# Patient Record
Sex: Female | Born: 1955 | Race: White | Hispanic: No | Marital: Married | Smoking: Former smoker
Health system: Southern US, Community
[De-identification: ages and names within clinical notes are randomized; demographics above are authoritative.]

## PROBLEM LIST (undated history)

## (undated) DIAGNOSIS — I10 Essential (primary) hypertension: Secondary | ICD-10-CM

## (undated) DIAGNOSIS — G4730 Sleep apnea, unspecified: Secondary | ICD-10-CM

## (undated) DIAGNOSIS — R519 Headache, unspecified: Secondary | ICD-10-CM

## (undated) DIAGNOSIS — M51369 Other intervertebral disc degeneration, lumbar region without mention of lumbar back pain or lower extremity pain: Secondary | ICD-10-CM

## (undated) DIAGNOSIS — R920 Mammographic microcalcification found on diagnostic imaging of breast: Secondary | ICD-10-CM

## (undated) DIAGNOSIS — Z9889 Other specified postprocedural states: Secondary | ICD-10-CM

## (undated) DIAGNOSIS — F329 Major depressive disorder, single episode, unspecified: Secondary | ICD-10-CM

## (undated) DIAGNOSIS — M1611 Unilateral primary osteoarthritis, right hip: Secondary | ICD-10-CM

## (undated) DIAGNOSIS — J45909 Unspecified asthma, uncomplicated: Secondary | ICD-10-CM

## (undated) DIAGNOSIS — I2781 Cor pulmonale (chronic): Secondary | ICD-10-CM

## (undated) DIAGNOSIS — Z889 Allergy status to unspecified drugs, medicaments and biological substances status: Secondary | ICD-10-CM

## (undated) DIAGNOSIS — E041 Nontoxic single thyroid nodule: Secondary | ICD-10-CM

## (undated) DIAGNOSIS — G5600 Carpal tunnel syndrome, unspecified upper limb: Secondary | ICD-10-CM

## (undated) DIAGNOSIS — F419 Anxiety disorder, unspecified: Secondary | ICD-10-CM

## (undated) DIAGNOSIS — R51 Headache: Secondary | ICD-10-CM

## (undated) DIAGNOSIS — E079 Disorder of thyroid, unspecified: Secondary | ICD-10-CM

## (undated) DIAGNOSIS — M1712 Unilateral primary osteoarthritis, left knee: Secondary | ICD-10-CM

## (undated) DIAGNOSIS — IMO0001 Reserved for inherently not codable concepts without codable children: Secondary | ICD-10-CM

## (undated) DIAGNOSIS — E119 Type 2 diabetes mellitus without complications: Secondary | ICD-10-CM

## (undated) DIAGNOSIS — K219 Gastro-esophageal reflux disease without esophagitis: Secondary | ICD-10-CM

## (undated) DIAGNOSIS — Z8719 Personal history of other diseases of the digestive system: Secondary | ICD-10-CM

## (undated) DIAGNOSIS — R151 Fecal smearing: Secondary | ICD-10-CM

## (undated) DIAGNOSIS — R112 Nausea with vomiting, unspecified: Secondary | ICD-10-CM

## (undated) DIAGNOSIS — K649 Unspecified hemorrhoids: Secondary | ICD-10-CM

## (undated) DIAGNOSIS — R52 Pain, unspecified: Secondary | ICD-10-CM

## (undated) DIAGNOSIS — M5136 Other intervertebral disc degeneration, lumbar region: Secondary | ICD-10-CM

## (undated) DIAGNOSIS — F32A Depression, unspecified: Secondary | ICD-10-CM

## (undated) DIAGNOSIS — Z1239 Encounter for other screening for malignant neoplasm of breast: Secondary | ICD-10-CM

## (undated) DIAGNOSIS — R1319 Other dysphagia: Secondary | ICD-10-CM

## (undated) DIAGNOSIS — R011 Cardiac murmur, unspecified: Secondary | ICD-10-CM

## (undated) DIAGNOSIS — K224 Dyskinesia of esophagus: Secondary | ICD-10-CM

## (undated) DIAGNOSIS — M4726 Other spondylosis with radiculopathy, lumbar region: Secondary | ICD-10-CM

## (undated) DIAGNOSIS — Z9109 Other allergy status, other than to drugs and biological substances: Secondary | ICD-10-CM

## (undated) DIAGNOSIS — M1990 Unspecified osteoarthritis, unspecified site: Secondary | ICD-10-CM

## (undated) HISTORY — DX: Unspecified hemorrhoids: K64.9

## (undated) HISTORY — DX: Carpal tunnel syndrome, unspecified upper limb: G56.00

## (undated) HISTORY — DX: Encounter for other screening for malignant neoplasm of breast: Z12.39

## (undated) HISTORY — PX: TUBAL LIGATION: SHX77

## (undated) HISTORY — DX: Mammographic microcalcification found on diagnostic imaging of breast: R92.0

## (undated) HISTORY — PX: ANTERIOR FUSION CERVICAL SPINE: SUR626

## (undated) HISTORY — PX: BREAST BIOPSY: SHX20

## (undated) HISTORY — DX: Fecal smearing: R15.1

## (undated) HISTORY — PX: MOUTH LESION EXCISIONAL BIOPSY: SUR470

## (undated) HISTORY — PX: UPPER GI ENDOSCOPY: SHX6162

## (undated) HISTORY — DX: Gastro-esophageal reflux disease without esophagitis: K21.9

## (undated) HISTORY — DX: Unspecified asthma, uncomplicated: J45.909

## (undated) HISTORY — DX: Essential (primary) hypertension: I10

---
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
## 1985-05-05 HISTORY — PX: DILATION AND CURETTAGE OF UTERUS: SHX78

## 1986-05-05 HISTORY — PX: OTHER SURGICAL HISTORY: SHX169

## 1989-05-05 HISTORY — PX: TONSILLECTOMY: SUR1361

## 1994-05-05 HISTORY — PX: BUNIONECTOMY: SHX129

## 1997-05-05 DIAGNOSIS — G5600 Carpal tunnel syndrome, unspecified upper limb: Secondary | ICD-10-CM

## 1997-05-05 HISTORY — DX: Carpal tunnel syndrome, unspecified upper limb: G56.00

## 1999-05-06 HISTORY — PX: NOSE SURGERY: SHX723

## 2000-05-05 HISTORY — PX: CHOLECYSTECTOMY: SHX55

## 2001-05-05 HISTORY — PX: OTHER SURGICAL HISTORY: SHX169

## 2004-04-26 ENCOUNTER — Ambulatory Visit: Payer: Self-pay

## 2004-05-05 HISTORY — PX: OTHER SURGICAL HISTORY: SHX169

## 2004-05-30 ENCOUNTER — Ambulatory Visit: Payer: Self-pay | Admitting: General Surgery

## 2004-06-29 ENCOUNTER — Emergency Department: Payer: Self-pay | Admitting: Emergency Medicine

## 2004-12-09 ENCOUNTER — Ambulatory Visit: Payer: Self-pay | Admitting: General Surgery

## 2005-04-30 ENCOUNTER — Ambulatory Visit: Payer: Self-pay | Admitting: General Surgery

## 2005-06-07 ENCOUNTER — Ambulatory Visit: Payer: Self-pay | Admitting: Orthopedic Surgery

## 2006-06-18 ENCOUNTER — Ambulatory Visit: Payer: Self-pay

## 2007-05-06 HISTORY — PX: COLONOSCOPY: SHX174

## 2007-07-12 ENCOUNTER — Ambulatory Visit: Payer: Self-pay

## 2007-09-24 ENCOUNTER — Ambulatory Visit: Payer: Self-pay | Admitting: Gastroenterology

## 2007-12-28 ENCOUNTER — Other Ambulatory Visit: Payer: Self-pay

## 2007-12-28 ENCOUNTER — Ambulatory Visit: Payer: Self-pay

## 2007-12-30 ENCOUNTER — Ambulatory Visit: Payer: Self-pay

## 2008-03-23 ENCOUNTER — Ambulatory Visit: Payer: Self-pay | Admitting: Physician Assistant

## 2008-05-05 HISTORY — PX: BACK SURGERY: SHX140

## 2008-05-17 ENCOUNTER — Ambulatory Visit: Payer: Self-pay | Admitting: Unknown Physician Specialty

## 2008-06-22 ENCOUNTER — Ambulatory Visit: Payer: Self-pay | Admitting: Cardiology

## 2008-06-22 ENCOUNTER — Ambulatory Visit: Payer: Self-pay | Admitting: Unknown Physician Specialty

## 2008-06-29 ENCOUNTER — Ambulatory Visit: Payer: Self-pay | Admitting: Unknown Physician Specialty

## 2008-09-28 ENCOUNTER — Ambulatory Visit: Payer: Self-pay

## 2009-10-31 ENCOUNTER — Ambulatory Visit: Payer: Self-pay

## 2010-05-05 DIAGNOSIS — K649 Unspecified hemorrhoids: Secondary | ICD-10-CM

## 2010-05-05 HISTORY — PX: OTHER SURGICAL HISTORY: SHX169

## 2010-05-05 HISTORY — DX: Unspecified hemorrhoids: K64.9

## 2010-12-18 ENCOUNTER — Ambulatory Visit: Payer: Self-pay | Admitting: Unknown Physician Specialty

## 2010-12-23 ENCOUNTER — Ambulatory Visit: Payer: Self-pay | Admitting: Unknown Physician Specialty

## 2011-01-01 ENCOUNTER — Ambulatory Visit: Payer: Self-pay | Admitting: Unknown Physician Specialty

## 2011-01-30 ENCOUNTER — Ambulatory Visit: Payer: Self-pay

## 2011-07-03 ENCOUNTER — Other Ambulatory Visit: Payer: Self-pay | Admitting: General Surgery

## 2011-07-03 LAB — CREATININE, SERUM
EGFR (African American): >60
EGFR (Non-African Amer.): >60

## 2011-07-11 ENCOUNTER — Ambulatory Visit: Payer: Self-pay | Admitting: General Surgery

## 2011-08-21 ENCOUNTER — Ambulatory Visit: Payer: Self-pay

## 2011-08-21 LAB — HEMATOCRIT: HCT: 40.4 % (ref 35.0–47.0)

## 2011-09-02 ENCOUNTER — Ambulatory Visit: Payer: Self-pay

## 2011-10-17 ENCOUNTER — Ambulatory Visit: Payer: Self-pay | Admitting: Otolaryngology

## 2011-11-25 ENCOUNTER — Ambulatory Visit: Payer: Self-pay | Admitting: Internal Medicine

## 2012-03-02 ENCOUNTER — Ambulatory Visit: Payer: Self-pay

## 2012-05-05 DIAGNOSIS — R920 Mammographic microcalcification found on diagnostic imaging of breast: Secondary | ICD-10-CM

## 2012-05-05 HISTORY — DX: Mammographic microcalcification found on diagnostic imaging of breast: R92.0

## 2012-05-05 HISTORY — PX: KNEE SURGERY: SHX244

## 2012-05-05 HISTORY — PX: BREAST EXCISIONAL BIOPSY: SUR124

## 2012-05-05 HISTORY — PX: OTHER SURGICAL HISTORY: SHX169

## 2012-07-31 ENCOUNTER — Encounter: Payer: Self-pay | Admitting: General Surgery

## 2012-08-13 ENCOUNTER — Ambulatory Visit: Payer: Self-pay | Admitting: Neurology

## 2012-09-01 ENCOUNTER — Encounter: Payer: Self-pay | Admitting: *Deleted

## 2012-09-23 ENCOUNTER — Ambulatory Visit: Payer: Self-pay | Admitting: General Surgery

## 2012-09-28 ENCOUNTER — Ambulatory Visit: Payer: Self-pay | Admitting: General Surgery

## 2012-09-29 ENCOUNTER — Encounter: Payer: Self-pay | Admitting: General Surgery

## 2012-09-30 ENCOUNTER — Ambulatory Visit: Payer: Self-pay | Admitting: General Surgery

## 2012-10-04 ENCOUNTER — Telehealth: Payer: Self-pay | Admitting: General Surgery

## 2012-10-04 NOTE — Telephone Encounter (Signed)
Discussed recent mammogram with patient. No interval change. BIRAD 3.  No intervention required at this time. F.U as scheduled on October 28, 2012.

## 2012-10-28 ENCOUNTER — Ambulatory Visit (INDEPENDENT_AMBULATORY_CARE_PROVIDER_SITE_OTHER): Payer: BC Managed Care – PPO | Admitting: General Surgery

## 2012-10-28 ENCOUNTER — Encounter: Payer: Self-pay | Admitting: General Surgery

## 2012-10-28 VITALS — BP 160/82 | HR 76 | Resp 14 | Ht <= 58 in | Wt 212.0 lb

## 2012-10-28 DIAGNOSIS — R920 Mammographic microcalcification found on diagnostic imaging of breast: Secondary | ICD-10-CM

## 2012-10-28 DIAGNOSIS — L989 Disorder of the skin and subcutaneous tissue, unspecified: Secondary | ICD-10-CM | POA: Insufficient documentation

## 2012-10-28 NOTE — Progress Notes (Signed)
Patient ID: Deborah Miller, female   DOB: 09-Aug-1955, 57 y.o.   MRN: [id number]  Chief Complaint  Patient presents with  . Breast Problem    mammogram    HPI Deborah Miller is a 57 y.o. female who presents for a breast evaluation. The most recent mammogram was done on 09/23/12 with birad category 3. Patient does perform regular self breast checks and gets regular mammograms done. No family history of breast cancer. No problems with the breasts at this time. She had a left breast biopsy in 2006 which was benign.  The patient reports she became aware of to dry patches on the areolar skin between the base of the nipple and the edge of the areola at the 12:00 position within the last 6 months. This was not evident at the time of her last office visit.   HPI  Past Medical History  Diagnosis Date  . Asthma 1957  . Carpal tunnel syndrome   . Hemorrhoids 2012  . Hypertension   . Breast screening, unspecified   . Acid reflux   . Fecal smearing     Past Surgical History  Procedure Laterality Date  . Nose surgery  2001  . Ganglion cyst removal   1988  . Dilation and curettage of uterus  1987  . Tonsilloadenoidectomy  2003  . Cholecystectomy  2002  . Colonoscopy  2009    Dr. Mechele Collin  . Carpal tunnel--left hand  Left 2012  . Back surgery  2010    C 5 & 6  . Upper gi endoscopy  2009  . Left breast biopsy  2006  . Bunionectomy  1996  . Nasal septum surgery  1975  . Neck injection  2014    Family History  Problem Relation Age of Onset  . Ovarian cancer Maternal Grandmother     Social History History  Substance Use Topics  . Smoking status: Former Smoker -- 1.00 packs/day for 10 years  . Smokeless tobacco: Never Used  . Alcohol Use: Yes    Allergies  Allergen Reactions  . Latex     blisters  . Tape     blisters    Current Outpatient Prescriptions  Medication Sig Dispense Refill  . ADVAIR HFA 115-21 MCG/ACT inhaler Inhale 1-2 puffs into the lungs daily.      .  Calcium Carbonate-Vitamin D (CALCIUM 600 + D PO) Take 1 tablet by mouth daily.      Marland Kitchen gabapentin (NEURONTIN) 300 MG capsule Take 2 capsules by mouth 2 (two) times daily.      Marland Kitchen L-Lysine 500 MG CAPS Take 2 capsules by mouth daily.      . meloxicam (MOBIC) 7.5 MG tablet Take 1 tablet by mouth daily.      . Multiple Vitamin (MULTIVITAMIN) tablet Take 1 tablet by mouth daily.      . potassium chloride (K-DUR,KLOR-CON) 10 MEQ tablet Take 1 tablet by mouth daily.      Marland Kitchen PRILOSEC OTC 20 MG tablet Take 1 tablet by mouth daily.      Marland Kitchen SPIRIVA HANDIHALER 18 MCG inhalation capsule Place 1 capsule into inhaler and inhale daily.      Marland Kitchen torsemide (DEMADEX) 20 MG tablet Take 1 tablet by mouth daily.      . valACYclovir (VALTREX) 1000 MG tablet Take 1 tablet by mouth 2 (two) times daily as needed.       No current facility-administered medications for this visit.    Review of Systems Review of  Systems  Constitutional: Negative.   Respiratory: Negative.   Cardiovascular: Negative.     Blood pressure 160/82, pulse 76, resp. rate 14, height 4\' 8"  (1.422 m), weight 212 lb (96.163 kg).  Physical Exam Physical Exam  Constitutional: She appears well-developed and well-nourished.  Neck: No thyromegaly present.  Cardiovascular: Normal rate, regular rhythm and normal heart sounds.   Pulmonary/Chest: Effort normal and breath sounds normal. Right breast exhibits no inverted nipple, no mass, no nipple discharge, no skin change and no tenderness. Left breast exhibits no inverted nipple, no mass, no nipple discharge, no skin change and no tenderness. Breasts are symmetrical.    2 - 5 mm pale areas on the right areola.    Well healed biopsy site at 3 o'clock on left breast.   Lymphadenopathy:    She has no cervical adenopathy.    She has no axillary adenopathy.  Neurological: She is alert.  Skin: Skin is warm and dry.    Data Reviewed Left breast mammogram dated Sep 23, 2012 showed persistent and stable  appearing widely scattered calcifications were several small clusters. Assessment was felt to be limited secondary to motion artifact. The area is slightly more prominent over the previous years but stable compared to recent exams. BI-RAD-3.  Assessment    Left breast microcalcifications.  New dermal lesions on the right areola.     Plan    The stable appearance of the microcalcifications is reassuring. The patient is very anxious about the possibility of distal latency. The options for stereotactic biopsy was reviewed. She had a difficult experience with wire localization and biopsy in the past. The stereotactic procedure was reviewed in detail. At this time in spite of a stable mammogram in the last 6 months she desires to proceed with biopsy. We'll arrange for Punch biopsies of the areolar lesions at her followup visit post stereotactic biopsy to confirm a benign process.     Patient has been scheduled for a left breast stereotactic biopsy at [facility] for 11-08-12 at 4 pm. She will check-in at the [facility] at 3:30 pm. This patient is aware of date, time, and instructions. Patient verbalizes understanding.   Earline Mayotte 10/28/2012, 8:40 PM

## 2012-10-28 NOTE — Patient Instructions (Addendum)
Patient to be scheduled for a stereotactic breast biopsy.   Breast Biopsy, Stereotactic A stereotactic breast biopsy takes a tissue sample from the breast with a special instrument. This is done when:  The problem (lump, abnormality, mass) can be seen on X-ray, but not felt on physical exam.  Suspicious, small calcium deposits (calcifications) are seen in the breast.  There is a change in shape or appearance of the breast, thickening, or asymmetry on mammogram (breast X-ray).  You have nipple changes (unusual or bloody discharge, crusting, retraction, dimpling).  Your caregiver is making a surgical diagnosis. The biopsy may be done on a special table, with your face down and your breasts placed through openings in the table. Computerized imaging (special form of X-rays) is used. The images are not obtained using regular X-ray film. So, exposure to radiation is reduced. Images are seen through several different angles. The surgeon removes small pieces of the suspicious tissue through a hollow needle. The tissue will be sent to the lab for analysis. The surgeon can look at the pictures right away, rather than wait for an X-ray to be developed. Your caregiver can mark the lesions (abnormal tissue formations) electronically. Then the computer can tell exactly where the problem is, or if it has moved. BENEFITS OF THE PROCEDURE  This is a good way to see if tiny lumps, abnormal looking tissue, or calcium deposits that you cannot feel are cancerous or require further treatment or follow-up.  Needle biopsy is a simple procedure. It may be performed in an outpatient imaging center. This means you have the procedure and go home the same day, without checking into a hospital.  It is less painful than open surgery. The results are as accurate as when a tissue sample is removed surgically.  The procedure is faster, less expensive, less invasive, does not distort the breast, and leaves little or no  scar.  Breast defects, which can make future mammograms hard to read and interpret, do not remain.  Recovery time is brief. Patients can soon resume their normal activities.  Using VAD (vacuum assisted device) may make it possible to remove entire lesions.  A breast biopsy can indicate if you need surgery, other treatment, or combined treatment. LET YOUR CAREGIVER KNOW ABOUT:  Allergies.  Medications taken, including herbs, eye drops, over-the-counter medications, and creams.  Use of steroids (by mouth or creams).  Previous problems with anesthetics or numbing medication.  If you are taking blood thinner medications or aspirin.  Possibility of pregnancy, if this applies.  History of blood clots (thrombophlebitis).  History of bleeding or blood problems.  Previous surgery.  Other health problems. RISKS AND COMPLICATIONS  Infection (germ growing in the wound). This can often be treated with antibiotics.  Bleeding, following surgery. Your surgeon takes every precaution to keep this from happening.  There is some concern that if a cancerous mass is present, cancer cells might be spread by the needle. Whether this actually happens is not known. It does not appear to be a significant risk.  X-ray guided breast biopsy is not infallible (not always correct). The problem may be missed or the extent of the problem may be underestimated. This would mean the biopsy did not manage to remove a piece of the diseased tissue or enough of the diseased tissue.  Lesions present, with calcium deposits scattered throughout the breast, are difficult to target by stereotactic method. Those lesions near the chest wall also are hard to learn about by this method.  If the mammogram shows only a vague change in tissue density, but no definite mass or nodule, the X-ray guided method may not be successful. Occasionally, even after a successful biopsy, the tissue diagnosis remains uncertain. A surgical  biopsy will be needed, if abnormal or precancerous cells are found on core biopsy.  Altering or deforming of the breast.  Unable to find, or missing the lesion.  Rarely, the needle may go through the chest wall into the lung area. TWO BIOPSY INSTRUMENTS MAY BE USED IN THE PROCEDURE The conventional biopsy device (core needle biopsy device) consists of an inner needle with a trough extending from it at one end, and an overlying sheath. It is attached to a spring-loaded mechanism that propels it forward. The trough fills with tissue. The outer sheath instantly moves forward to cut the tissue and keep it in the trough. Each sample is obtained in a fraction of a second. It is necessary to withdraw the needle after each sample is taken to collect the tissue.  A newer type of instrument, the VAD (vacuum assisted device), uses vacuum pressure to pull breast tissue into a needle and remove it. The needle does not need to be withdrawn after each sampling. Another advantage is that biopsies are obtained in an orderly manner, by rotating the device. This helps make sure that the entire area of interest will be sampled. When using the automated core biopsy needle, sampling is more random.  FOR COMFORT DURING THE TEST  Relax as much as possible.  Try to follow instructions, to speed up the test.  Let your caregiver know if you are uncomfortable, anxious, or in pain. PROCEDURE  You are awake during the procedure, and you go home the same day (outpatient). A specially trained radiologist will do this procedure. First, the skin is cleansed. Then, it is injected with a local anesthetic. A small nick is made in the skin, and the tip of the biopsy needle is put into the calculated site of the lesion. A special mammography machine uses ionizing radiation to help guide the radiologist's instrument to the site of the abnormal growth. At this point, stereo images are again obtained, to confirm that the needle tip is at  the problem area. Usually 5 to 10 samples are collected when doing a core biopsy. At least 12 are collected when using the vacuum assisted device (VAD). Then, a final set of images is obtained. If they show that the lesion has been mostly or completely removed, a small clip is left at the biopsy site. This is so that it can be easily located, in case the lesion turns out to be cancer. Afterward, the skin opening is stitched (sutured) or taped closed, and covered with a dressing. Your caregiver may apply a pressure dressing and an ice pack, to prevent bleeding and swelling in the breast.  X-ray guided breast biopsy can take 30 minutes to 1 hour, or more. The X-rays usually have no side effects, and no radiation remains in your body. There is usually little or no pain. Usually no scar is left from the tiny skin incision. Many women find that the major discomfort of the procedure is from lying on their stomach, or staying in 1 position for the length of the procedure. This discomfort may be reduced by carefully placed cushions. You should wear a good support bra to the procedure. You will be asked to remove jewelry, dentures, eye glasses, metal objects, or clothing that might interfere with the  X-ray images. You may want to have someone with you, to take you home after the procedure. AFTER THE PROCEDURE   After surgery, if you are doing well and have no problems, you will be allowed to go home.  You may resume your regular diet, or as directed by your caregiver. HOME CARE INSTRUCTIONS   Follow your caregiver's recommendations for medications, care of the biopsy site, follow-up appointments, and further treatment.  Only take over-the-counter or prescription medicines for pain, discomfort, or fever as directed by your caregiver.  An ice pack applied to the affected area may help with discomfort and keep the swelling down.  Change dressings as directed.  Wear a good support bra for as long as your  caregiver recommends.  Avoid strenuous activity for at least 24 hours, or as advised by your caregiver. Finding out the results of your test Not all test results are available during your visit. If your test results are not back during the visit, make an appointment with your caregiver to find out the results. Do not assume everything is normal if you have not heard from your caregiver or the medical facility. It is important for you to follow up on all of your test results.  SEEK MEDICAL CARE IF:   You develop a rash.  You have problems with your medicines.  You become lightheaded or dizzy. SEEK IMMEDIATE MEDICAL CARE IF:   There is increased bleeding (more than a small spot) from the biopsy site.  You notice redness, swelling, or increasing pain in the wound.  Pus is coming from the wound.  You have a fever.  You notice a bad smell coming from the wound or dressing.  You develop shortness of breath.  You develop chest pain.  You pass out. Document Released: 01/18/2003 Document Revised: 07/14/2011 Document Reviewed: 02/23/2009 [facility] Patient Information 2014 [place], [place].  Patient has been scheduled for a left breast stereotactic biopsy at [facility] for 11-08-12 at 4 pm. She will check-in at the [facility] at 3:30 pm. This patient is aware of date, time, and instructions. Patient verbalizes understanding.

## 2012-11-08 ENCOUNTER — Ambulatory Visit: Payer: Self-pay | Admitting: General Surgery

## 2012-11-08 DIAGNOSIS — R920 Mammographic microcalcification found on diagnostic imaging of breast: Secondary | ICD-10-CM

## 2012-11-09 ENCOUNTER — Encounter: Payer: Self-pay | Admitting: General Surgery

## 2012-11-09 ENCOUNTER — Telehealth: Payer: Self-pay | Admitting: *Deleted

## 2012-11-09 LAB — PATHOLOGY REPORT

## 2012-11-09 NOTE — Telephone Encounter (Signed)
Notified patient as instructed, benign breast biopsy per Dr Lemar Livings, patient pleased. Discussed follow-up appointments 11-15-12 for punch biopsy, patient agrees Will need bil [place] mammo in 6 month

## 2012-11-10 ENCOUNTER — Encounter: Payer: Self-pay | Admitting: General Surgery

## 2012-11-15 ENCOUNTER — Encounter: Payer: Self-pay | Admitting: General Surgery

## 2012-11-15 ENCOUNTER — Ambulatory Visit (INDEPENDENT_AMBULATORY_CARE_PROVIDER_SITE_OTHER): Payer: BC Managed Care – PPO | Admitting: General Surgery

## 2012-11-15 VITALS — BP 126/88 | HR 80 | Resp 14 | Ht <= 58 in | Wt 211.0 lb

## 2012-11-15 DIAGNOSIS — R920 Mammographic microcalcification found on diagnostic imaging of breast: Secondary | ICD-10-CM

## 2012-11-15 DIAGNOSIS — R229 Localized swelling, mass and lump, unspecified: Secondary | ICD-10-CM

## 2012-11-15 NOTE — Progress Notes (Signed)
Patient ID: LEVORA WERDEN, female   DOB: June 30, 1955, 57 y.o.   MRN: [id number]  Chief Complaint  Patient presents with  . Routine Post Op    left breast stereo / also needs right breast punch biopsy    HPI Deborah Miller is a 57 y.o. female who presents for a post op left breast stereotactic biopsy. The procedure was performed on 11/08/12. Results were negative for malignancy. She also presents for a right breast punch biopsy at today's visit. No complaints at this time.  The 2-5 mm areas of pink tissue on the right areola were felt toward biopsy to evaluate for Paget's disease.  HPI  Past Medical History  Diagnosis Date  . Asthma 1957  . Carpal tunnel syndrome   . Hemorrhoids 2012  . Hypertension   . Breast screening, unspecified   . Acid reflux   . Fecal smearing   . Breast microcalcification, mammographic 2014    Past Surgical History  Procedure Laterality Date  . Nose surgery  2001  . Ganglion cyst removal   1988  . Dilation and curettage of uterus  1987  . Tonsilloadenoidectomy  2003  . Cholecystectomy  2002  . Colonoscopy  2009    Dr. Mechele Collin  . Carpal tunnel--left hand  Left 2012  . Back surgery  2010    C 5 & 6  . Upper gi endoscopy  2009  . Left breast biopsy  2006  . Bunionectomy  1996  . Nasal septum surgery  1975  . Neck injection  2014  . Breast biopsy Left 2014    Stereo Bx    Family History  Problem Relation Age of Onset  . Ovarian cancer Maternal Grandmother     Social History History  Substance Use Topics  . Smoking status: Former Smoker -- 1.00 packs/day for 10 years  . Smokeless tobacco: Never Used  . Alcohol Use: Yes    Allergies  Allergen Reactions  . Latex     blisters  . Tape     blisters    Current Outpatient Prescriptions  Medication Sig Dispense Refill  . ADVAIR HFA 115-21 MCG/ACT inhaler Inhale 1-2 puffs into the lungs daily.      Marland Kitchen ALPRAZolam (XANAX) 0.25 MG tablet Take 1 tablet by mouth daily.      . Calcium  Carbonate-Vitamin D (CALCIUM 600 + D PO) Take 1 tablet by mouth daily.      Marland Kitchen gabapentin (NEURONTIN) 300 MG capsule Take 2 capsules by mouth 2 (two) times daily.      Marland Kitchen L-Lysine 500 MG CAPS Take 2 capsules by mouth daily.      . meloxicam (MOBIC) 7.5 MG tablet Take 1 tablet by mouth daily.      . Multiple Vitamin (MULTIVITAMIN) tablet Take 1 tablet by mouth daily.      . potassium chloride (K-DUR,KLOR-CON) 10 MEQ tablet Take 1 tablet by mouth daily.      Marland Kitchen PRILOSEC OTC 20 MG tablet Take 1 tablet by mouth daily.      Marland Kitchen SPIRIVA HANDIHALER 18 MCG inhalation capsule Place 1 capsule into inhaler and inhale daily.      Marland Kitchen torsemide (DEMADEX) 20 MG tablet Take 1 tablet by mouth daily.      . valACYclovir (VALTREX) 1000 MG tablet Take 1 tablet by mouth 2 (two) times daily as needed.       No current facility-administered medications for this visit.    Review of Systems Review of Systems  Constitutional: Negative.   Respiratory: Negative.   Cardiovascular: Negative.     Blood pressure 126/88, pulse 80, resp. rate 14, height 4\' 8"  (1.422 m), weight 211 lb (95.709 kg).  Physical Exam Physical Exam The left breast shows minimal bruising at the biopsy site. The right breast skin lesions of the areola are unchanged. Data Reviewed Left breast biopsy dated November 08, 2012 showed columnar cell change, fibrocystic changes with associated microcalcifications and focal fibroadenomatous changes with associated microcalcifications. No evidence of atypia or malignancy.  Assessment    Benign left breast biopsy.  Focal skin changes right areola.     Plan    We'll plan for bilateral  Screening mammograms in 6 months to follow up recently completed breast biopsy.  Because of the dermal changes of the right areola biopsy was recommended and accepted. 3 cc of 0.5% Xylocaine 0.25% Marcaine with 1-200,000 epinephrine were utilized well-tolerated. Chlor prep was applied to the skin. 2, 2.5 mm punch biopsies were  taken, one from each lesion in the 12:00 position of the right areola. Skin defects were closed with 5-0 nylon suture. Telfa and Tegaderm dressing applied. Wound care instructions reviewed. The patient will return in one week for suture removal and will be contacted by phone when the pathology results are available.        Earline Mayotte 11/15/2012, 9:06 PM

## 2012-11-18 LAB — PATHOLOGY

## 2012-11-23 ENCOUNTER — Ambulatory Visit (INDEPENDENT_AMBULATORY_CARE_PROVIDER_SITE_OTHER): Payer: BC Managed Care – PPO | Admitting: *Deleted

## 2012-11-23 DIAGNOSIS — R229 Localized swelling, mass and lump, unspecified: Secondary | ICD-10-CM

## 2012-11-23 NOTE — Patient Instructions (Addendum)
6 months for bilateral mammogram and office visit Ice pack for today then use Heating pad for comfort Continue self breast exams. Call office for any new breast issues or concerns.

## 2012-11-23 NOTE — Progress Notes (Signed)
Patient here today for follow up post right breast punch biopsy.  Dressing removed, two sutures removed, steri strip applied and aware it may come off in one week.  Minimal bruising noted.  Area very tender, ice pack applied which helped. The patient is aware that a heating pad may be used for comfort as needed.  Aware of pathology. Follow up as scheduled in 6 months.

## 2012-11-24 ENCOUNTER — Telehealth: Payer: Self-pay | Admitting: *Deleted

## 2012-11-24 NOTE — Telephone Encounter (Signed)
Returning phone call from pt. Patient states that she noticed a dime size amount of blood on her night gown this morning and the steri strips were bloody. She is post right breast punch biopsy 11-15-12 and suture removal on 11-23-12. She states she has put on new steri strips and has been using ice.  She has not noticed any further bleeding from the area. Advised to continue the ice today as needed. She will call us back for any continued issues of concern.

## 2013-03-10 ENCOUNTER — Other Ambulatory Visit: Payer: Self-pay

## 2013-05-19 ENCOUNTER — Ambulatory Visit: Payer: Self-pay | Admitting: General Surgery

## 2013-05-20 ENCOUNTER — Encounter: Payer: Self-pay | Admitting: General Surgery

## 2013-05-25 ENCOUNTER — Encounter: Payer: Self-pay | Admitting: General Surgery

## 2013-05-25 ENCOUNTER — Ambulatory Visit (INDEPENDENT_AMBULATORY_CARE_PROVIDER_SITE_OTHER): Payer: BC Managed Care – PPO | Admitting: General Surgery

## 2013-05-25 VITALS — BP 134/78 | HR 76 | Resp 14 | Ht <= 58 in | Wt 212.0 lb

## 2013-05-25 DIAGNOSIS — Z1231 Encounter for screening mammogram for malignant neoplasm of breast: Secondary | ICD-10-CM

## 2013-05-25 NOTE — Patient Instructions (Signed)
Patient to return as needed. 

## 2013-05-25 NOTE — Progress Notes (Signed)
Patient ID: Deborah Miller, female   DOB: 28-Feb-1956, 58 y.o.   MRN: [id number]  Chief Complaint  Patient presents with  . Follow-up    mammogram    HPI Deborah HalimMaryann C Miller is a 58 y.o. female who presents for a breast evaluation. The most recent mammogram was done on 05/10/13. Patient does perform regular self breast checks and gets regular mammograms done.    The patient reports she is just getting over an episode of bronchitis.  HPI  Past Medical History  Diagnosis Date  . Asthma 1957  . Carpal tunnel syndrome   . Hemorrhoids 2012  . Hypertension   . Breast screening, unspecified   . Acid reflux   . Fecal smearing   . Breast microcalcification, mammographic 2014    Past Surgical History  Procedure Laterality Date  . Nose surgery  2001  . Ganglion cyst removal   1988  . Dilation and curettage of uterus  1987  . Tonsilloadenoidectomy  2003  . Cholecystectomy  2002  . Colonoscopy  2009    Dr. Mechele CollinElliott  . Carpal tunnel--left hand  Left 2012  . Back surgery  2010    C 5 & 6  . Upper gi endoscopy  2009  . Left breast biopsy  2006  . Bunionectomy  1996  . Nasal septum surgery  1975  . Neck injection  2014  . Breast biopsy Left 2014    Stereo Bx    Family History  Problem Relation Age of Onset  . Ovarian cancer Maternal Grandmother     Social History History  Substance Use Topics  . Smoking status: Former Smoker -- 1.00 packs/day for 10 years  . Smokeless tobacco: Never Used  . Alcohol Use: Yes    Allergies  Allergen Reactions  . Latex     blisters  . Tape     blisters    Current Outpatient Prescriptions  Medication Sig Dispense Refill  . ADVAIR HFA 115-21 MCG/ACT inhaler Inhale 1-2 puffs into the lungs daily.      Marland Kitchen. ALPRAZolam (XANAX) 0.25 MG tablet Take 1 tablet by mouth daily.      . Calcium Carbonate-Vitamin D (CALCIUM 600 + D PO) Take 1 tablet by mouth daily.      Marland Kitchen. gabapentin (NEURONTIN) 300 MG capsule Take 2 capsules by mouth 2 (two) times daily.       Marland Kitchen. L-Lysine 500 MG CAPS Take 2 capsules by mouth daily.      . meloxicam (MOBIC) 7.5 MG tablet Take 1 tablet by mouth daily.      . Multiple Vitamin (MULTIVITAMIN) tablet Take 1 tablet by mouth daily.      . potassium chloride (K-DUR,KLOR-CON) 10 MEQ tablet Take 1 tablet by mouth daily.      Marland Kitchen. PRILOSEC OTC 20 MG tablet Take 1 tablet by mouth daily.      Marland Kitchen. SPIRIVA HANDIHALER 18 MCG inhalation capsule Place 1 capsule into inhaler and inhale daily.      Marland Kitchen. torsemide (DEMADEX) 20 MG tablet Take 1 tablet by mouth daily.      . valACYclovir (VALTREX) 1000 MG tablet Take 1 tablet by mouth 2 (two) times daily as needed.       No current facility-administered medications for this visit.    Review of Systems Review of Systems  Constitutional: Negative.   Respiratory: Negative.   Cardiovascular: Negative.     Blood pressure 134/78, pulse 76, resp. rate 14, height 4\' 8"  (1.422 m), weight  212 lb (96.163 kg).  Physical Exam Physical Exam  Constitutional: She is oriented to person, place, and time. She appears well-developed and well-nourished.  Eyes: Conjunctivae are normal.  Cardiovascular: Normal rate, regular rhythm and normal heart sounds.   Right  Upper lobe a little crackling.   Pulmonary/Chest: Breath sounds normal. Right breast exhibits no inverted nipple, no mass, no nipple discharge, no skin change and no tenderness. Left breast exhibits no inverted nipple, no mass, no nipple discharge, no skin change and no tenderness.  Lymphadenopathy:    She has no cervical adenopathy.    She has no axillary adenopathy.  Neurological: She is alert and oriented to person, place, and time.  Skin: Skin is warm and dry.    Data Reviewed RIGHT AREOLA 12:00 PUNCH BIOPSY *STAT*: - SKIN WITH HYPERKERATOSIS, FOCAL SPONGIOSIS, AND SUPERFICIAL PERIVASCULAR DERMATITIS, SEE COMMENT. - NEGATIVE FOR DYSPLASIA AND MALIGNANCY. 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  Possible etiologies for these findings include arthropod bite, fungal infection, other eczematous dermatoses and drug. Clinical correlation is advised.  Left breast stereotactic biopsy completed 11/08/2012 showed columnar cell changes, fibrocystic change with associated microcalcifications, focal fibroadenomatous changes with microcalcifications. No atypia or malignancy.  Screening mammograms dated 05/19/2013 showed no mammographic abnormality. BI-RAD-1. Not mentioned but true is the location of the biopsy clip at the site of the previous density.   Assessment    Benign breast exam.  Focal dermatitis of the areola, biopsy negative for Paget's.    Plan    The patient will continue annual screening mammograms with her gynecologist.       Earline Mayotte 05/26/2013, 5:39 PM

## 2013-05-26 DIAGNOSIS — Z1231 Encounter for screening mammogram for malignant neoplasm of breast: Secondary | ICD-10-CM | POA: Insufficient documentation

## 2013-09-22 ENCOUNTER — Ambulatory Visit: Payer: Self-pay | Admitting: Unknown Physician Specialty

## 2013-10-23 DIAGNOSIS — G4733 Obstructive sleep apnea (adult) (pediatric): Secondary | ICD-10-CM | POA: Insufficient documentation

## 2013-10-23 DIAGNOSIS — I2781 Cor pulmonale (chronic): Secondary | ICD-10-CM | POA: Insufficient documentation

## 2014-03-03 ENCOUNTER — Emergency Department: Payer: Self-pay | Admitting: Emergency Medicine

## 2014-03-03 LAB — BASIC METABOLIC PANEL
ANION GAP: 12 (ref 7–16)
BUN: 14 mg/dL (ref 7–18)
CALCIUM: 8.5 mg/dL (ref 8.5–10.1)
CHLORIDE: 106 mmol/L (ref 98–107)
CO2: 24 mmol/L (ref 21–32)
Creatinine: 0.94 mg/dL (ref 0.60–1.30)
Glucose: 132 mg/dL — ABNORMAL HIGH (ref 65–99)
Osmolality: 285 (ref 275–301)
Potassium: 3.5 mmol/L (ref 3.5–5.1)
SODIUM: 142 mmol/L (ref 136–145)

## 2014-03-03 LAB — TROPONIN I: Troponin-I: <0.02 ng/mL

## 2014-03-03 LAB — CBC
HCT: 39.9 % (ref 35.0–47.0)
HGB: 13.1 g/dL (ref 12.0–16.0)
MCH: 29.2 pg (ref 26.0–34.0)
MCHC: 32.7 g/dL (ref 32.0–36.0)
MCV: 89 fL (ref 80–100)
PLATELETS: 245 10*3/uL (ref 150–440)
RBC: 4.48 10*6/uL (ref 3.80–5.20)
RDW: 13.7 % (ref 11.5–14.5)
WBC: 10.4 10*3/uL (ref 3.6–11.0)

## 2014-03-06 ENCOUNTER — Encounter: Payer: Self-pay | Admitting: General Surgery

## 2014-03-08 ENCOUNTER — Ambulatory Visit: Payer: Self-pay | Admitting: Unknown Physician Specialty

## 2014-03-20 ENCOUNTER — Ambulatory Visit (INDEPENDENT_AMBULATORY_CARE_PROVIDER_SITE_OTHER): Payer: BC Managed Care – PPO | Admitting: General Surgery

## 2014-03-20 ENCOUNTER — Encounter: Payer: Self-pay | Admitting: General Surgery

## 2014-03-20 VITALS — BP 132/74 | HR 80 | Resp 14 | Ht <= 58 in | Wt 215.0 lb

## 2014-03-20 DIAGNOSIS — K219 Gastro-esophageal reflux disease without esophagitis: Secondary | ICD-10-CM

## 2014-03-20 NOTE — Patient Instructions (Addendum)
Patient to have an upper endoscopy.  Patient has been scheduled for an upper end[facility].

## 2014-03-20 NOTE — Progress Notes (Signed)
Patient ID: Deborah Miller, female   DOB: 05-31-1955, 58 y.o.   MRN: [id number]  Chief Complaint  Patient presents with  . Other    chest and throat pain     HPI Deborah Miller is a 58 y.o. female here today for a evaluation of a upper endoscopy. Patient was seen in the ER with throat and chest pain on 03/03/14.  The patient had developed an unusual sense of nausea and feeling hot all over the day of presentation to the emergency department. Her symptoms were suggestive of a vagal response. She was evaluated by ECG which showed minimal left ventricular hypertrophy. She received a GI cocktail with some improvement. Laboratory studies were otherwise unremarkable. Chest x-ray was clear. Office follow-up was suggested.  The patient has had a previous upper endoscopy in 2009 for dysphagia completed by Deborah Miller M.D. Dilatation was undertaken at that time. Biopsies showed hyperplastic gastric polyps in the stomach. Inflamed squamous mucosa with glandular mucosa at the distal esophagus. No dysplasia. Colonoscopy of the same date was unremarkable.  The patient reports that rice can result in reflux symptoms and the need to regurgitate food. 2 weeks ago she ate a hotdog and several hours later brought up fragments of the hotdog.  Bread and meats occasionally resulted in dysphagia but no episode of regurgitation.   HPI  Past Medical History  Diagnosis Date  . Asthma 1957  . Carpal tunnel syndrome   . Hemorrhoids 2012  . Hypertension   . Breast screening, unspecified   . Acid reflux   . Fecal smearing   . Breast microcalcification, mammographic 2014    Past Surgical History  Procedure Laterality Date  . Nose surgery  2001  . Ganglion cyst removal   1988  . Dilation and curettage of uterus  1987  . Tonsilloadenoidectomy  2003  . Cholecystectomy  2002  . Colonoscopy  2009    Dr. Mechele Miller  . Carpal tunnel--left hand  Left 2012  . Back surgery  2010    C 5 & 6  . Upper gi endoscopy   2009  . Left breast biopsy  2006  . Bunionectomy  1996  . Nasal septum surgery  1975  . Neck injection  2014  . Breast biopsy Left 2014    Stereo Bx    Family History  Problem Relation Age of Onset  . Ovarian cancer Maternal Grandmother     Social History History  Substance Use Topics  . Smoking status: Former Smoker -- 1.00 packs/day for 10 years  . Smokeless tobacco: Never Used  . Alcohol Use: Yes    Allergies  Allergen Reactions  . Augmentin [Amoxicillin-Pot Clavulanate] Nausea And Vomiting  . Latex     blisters  . Tape     blisters    Current Outpatient Prescriptions  Medication Sig Dispense Refill  . ADVAIR HFA 115-21 MCG/ACT inhaler Inhale 1-2 puffs into the lungs daily.    Marland Kitchen. ALPRAZolam (XANAX) 0.25 MG tablet Take 1 tablet by mouth daily.    . Calcium Carbonate-Vitamin D (CALCIUM 600 + D PO) Take 1 tablet by mouth daily.    Marland Kitchen. gabapentin (NEURONTIN) 300 MG capsule Take 2 capsules by mouth 2 (two) times daily.    Marland Kitchen. L-Lysine 500 MG CAPS Take 2 capsules by mouth daily.    . meloxicam (MOBIC) 7.5 MG tablet Take 1 tablet by mouth daily.    . Multiple Vitamin (MULTIVITAMIN) tablet Take 1 tablet by mouth daily.    .Marland Kitchen  omeprazole (PRILOSEC) 20 MG capsule Take 20 mg by mouth daily.    . potassium chloride (K-DUR) 10 MEQ tablet Take 10 mEq by mouth daily.    . potassium chloride (K-DUR,KLOR-CON) 10 MEQ tablet Take 1 tablet by mouth daily.    Marland Kitchen. PRILOSEC OTC 20 MG tablet Take 1 tablet by mouth daily.    Marland Kitchen. SPIRIVA HANDIHALER 18 MCG inhalation capsule Place 1 capsule into inhaler and inhale daily.    Marland Kitchen. torsemide (DEMADEX) 20 MG tablet Take 1 tablet by mouth daily.    . valACYclovir (VALTREX) 1000 MG tablet Take 1 tablet by mouth 2 (two) times daily as needed.     No current facility-administered medications for this visit.    Review of Systems Review of Systems  Constitutional: Negative.   Respiratory: Positive for chest tightness. Negative for apnea, cough, choking,  shortness of breath and stridor.   Cardiovascular: Negative.   Gastrointestinal: Positive for nausea. Negative for abdominal pain, diarrhea, constipation, blood in stool, abdominal distention, anal bleeding and rectal pain.    Blood pressure 132/74, pulse 80, resp. rate 14, height 4\' 8"  (1.422 m), weight 215 lb (97.523 kg).  Physical Exam Physical Exam  Constitutional: She is oriented to person, place, and time. She appears well-developed and well-nourished.  Eyes: Conjunctivae are normal. No scleral icterus.  Neck: Neck supple.  Cardiovascular: Normal rate, regular rhythm and normal heart sounds.   Left ankle edema.   Pulmonary/Chest: Effort normal.  Lymphadenopathy:    She has no cervical adenopathy.  Neurological: She is alert and oriented to person, place, and time.  Skin: Skin is warm and dry.    Data Reviewed ED records, 2009 endoscopy reports.  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 Based on her report of regurgitating food several hours after eating repeat upper endoscopy and possible dilatation was discussed.     Plan    Risks associated with endoscopy were reviewed.  The patient is scheduled for partial [facility] care of Dr.[facility]le.  Ideally, endoscopy would take place prior to this to minimize the risk of seeding the new prosthesis. This does not meet with the patient's schedule, and the endoscopy will take place the end of December.  She reports allergy to Augmentin, and for that reason we will dose her with prior to the procedure. (Recognizing the literature is soft regarding the risk of bacteremia and subsequent joint infection, but the risk of a catastrophic joint infection outweighs the tiny risk a single dose of IV cefazolin.    Patient has been scheduled for an [facility].      PCP: Deborah PunchesMark Miller MD   Earline MayotteByrnett, Dominica Kent W 03/21/2014, 10:29  AM

## 2014-03-21 ENCOUNTER — Other Ambulatory Visit: Payer: Self-pay | Admitting: General Surgery

## 2014-03-21 DIAGNOSIS — K219 Gastro-esophageal reflux disease without esophagitis: Secondary | ICD-10-CM | POA: Insufficient documentation

## 2014-04-02 DIAGNOSIS — Z96652 Presence of left artificial knee joint: Secondary | ICD-10-CM | POA: Insufficient documentation

## 2014-04-21 ENCOUNTER — Ambulatory Visit: Payer: Self-pay | Admitting: Unknown Physician Specialty

## 2014-04-24 ENCOUNTER — Telehealth: Payer: Self-pay | Admitting: *Deleted

## 2014-04-24 ENCOUNTER — Encounter: Payer: Self-pay | Admitting: *Deleted

## 2014-04-24 NOTE — Progress Notes (Signed)
Patient ID: Deborah HalimMaryann C Egelhoff, female   DOB: February 22, 1956, 58 y.o.   MRN: [id number]  Patient states she did contact the physician's office who completed her recent knee surgery and they state she can safely decrease 325 mg aspirin to 81 mg aspirin starting on 04-26-14 through 05-03-14.

## 2014-04-24 NOTE — Telephone Encounter (Signed)
Patient was contacted today to confirm no medication changes since last office visit.   This patient states that she is now taking a 325 mg aspirin and tramadol 50 mg one at bedtime due to recent knee surgery. Patient was instructed to call her knee doctor and see if she could safely decrease aspirin to an 81 mg aspirin starting on 04-26-14 through 05-03-14. She is taking this to decrease risk of blood clot status post knee surgery. In the event she cannot decrease aspirin safely, we will need to check with Dr. Lemar LivingsByrnett to see if we can proceed with upper endoscopy as scheduled or if it would be in patient's best interest to reschedule.  Patient states she has already pre-registered by phone.  We will proceed with upper end[facility] that is [facility] unless contraindication due to aspirin dose.

## 2014-05-03 ENCOUNTER — Ambulatory Visit: Payer: Self-pay | Admitting: General Surgery

## 2014-05-03 DIAGNOSIS — R1310 Dysphagia, unspecified: Secondary | ICD-10-CM

## 2014-05-04 ENCOUNTER — Encounter: Payer: Self-pay | Admitting: General Surgery

## 2014-05-05 HISTORY — PX: JOINT REPLACEMENT: SHX530

## 2014-05-08 ENCOUNTER — Telehealth: Payer: Self-pay

## 2014-05-08 NOTE — Telephone Encounter (Signed)
-----   Message from Earline Mayotte, MD sent at 05/08/2014  9:40 AM EST ----- Please notify the patient on the biopsies were fine. See how she's been doing with her swallowing after her endoscopy. If she needs a follow up a point to discuss that would be fine. If she's doing well she does not need to come in. Thank you ----- Message -----    From: Jena Gauss, CMA    Sent: 05/04/2014   1:14 PM      To: Earline Mayotte, MD

## 2014-05-08 NOTE — Telephone Encounter (Signed)
Notified patient as instructed, patient pleased. States that she is doing well and no troubles with swallowing, just some gas. She does not feel the need to come in to be seen. She is aware to call back if she has any further problems or questions.

## 2014-05-09 ENCOUNTER — Encounter: Payer: Self-pay | Admitting: General Surgery

## 2014-05-22 ENCOUNTER — Ambulatory Visit: Payer: Self-pay

## 2014-05-25 ENCOUNTER — Ambulatory Visit: Payer: Self-pay

## 2014-06-03 ENCOUNTER — Telehealth: Payer: Self-pay | Admitting: General Surgery

## 2014-06-03 NOTE — Telephone Encounter (Signed)
The patient had been seen in January 2015 in follow-up having undergone a stereotactic biopsy of the left breast in summer 2014 with microcalcifications secondary to fibroadenomatous changes. She had been released to follow-up with her GYN at that time. Her recent January 2016 mammograms raised question of a possible density and calcifications in the right breast. Focal spot compression views and ultrasound were completed. A 6 month follow-up was recommended.  The patient did stop by the office on 05/30/2014 requesting that I review her films.  2012 through 2016 mammograms and ultrasounds were reviewed as well as her previous biopsy results from 2014.  The recommendations for a follow-up right breast diagnostic mammogram in 6 months is appropriate. If the microcalcifications progress a stereotactic biopsy will be reasonable. The density noted on her most recent mammograms seemed evident on the 2012 lateral views but not so much on the CC views. I suspect that this is a benign finding but a 6 month follow-up film in July 2016 will be appropriate for this lesion as well.  To the best of my knowledge he studies will be arranged by her gynecologist. The patient was encouraged to call should biopsy be recommended or she wants the films reviewed independently again.

## 2014-08-18 ENCOUNTER — Other Ambulatory Visit: Payer: Self-pay | Admitting: Unknown Physician Specialty

## 2014-08-18 DIAGNOSIS — R921 Mammographic calcification found on diagnostic imaging of breast: Secondary | ICD-10-CM

## 2014-08-27 NOTE — Op Note (Signed)
PATIENT NAME:  Deborah Miller, Deborah Miller MR#:  [id number] DATE OF BIRTH:  February 11, 1956  DATE OF PROCEDURE:  09/02/2011  PREOPERATIVE DIAGNOSIS: Possible endometrial hyperplasia.   POSTOPERATIVE DIAGNOSIS: Postmenopausal endometrium.   OPERATION PERFORMED: Dilation and curettage.   SURGEON: Deloris Pinghilip J. Rosenow, MD    OPERATIVE FINDINGS: Only with extreme efforts was I able to grasp the cervix. No attempt was made at uterine visualization; minimal if any tissue obtained by endometrial curettage.   OPERATION: After adequate general anesthesia, the patient was prepped and draped in routine fashion. Through much effort I was finally able to grasp the cervix. The cervix and uterine cavity were 4 cm. Cervix was dilated to a small amount. The uterine cavity was systematically vigorously curetted with return of what was essentially no tissue. The patient tolerated the procedure well and left the operating room in good condition. Sponge and needle counts were said to be correct at the end of the procedure.    ____________________________ Deloris PingPhilip J. Luella Cookosenow, MD pjr:drc D: 09/02/2011 09:52:00 ET T: 09/02/2011 10:21:49 ET JOB#: [id number]  cc: Deloris PingPhilip J. Luella Cookosenow, MD, <Dictator> Towana BadgerPHILIP J ROSENOW MD ELECTRONICALLY SIGNED 09/06/2011 15:53

## 2014-08-28 LAB — SURGICAL PATHOLOGY

## 2014-10-26 ENCOUNTER — Other Ambulatory Visit: Payer: Self-pay | Admitting: Unknown Physician Specialty

## 2014-10-26 DIAGNOSIS — M545 Low back pain, unspecified: Secondary | ICD-10-CM

## 2014-10-31 ENCOUNTER — Other Ambulatory Visit: Payer: Self-pay

## 2014-10-31 ENCOUNTER — Ambulatory Visit
Admission: RE | Admit: 2014-10-31 | Discharge: 2014-10-31 | Disposition: A | Discharge status: Home / Self Care | Payer: BC Managed Care – PPO | Source: Ambulatory Visit | Attending: Unknown Physician Specialty | Admitting: Unknown Physician Specialty

## 2014-10-31 DIAGNOSIS — M47816 Spondylosis without myelopathy or radiculopathy, lumbar region: Secondary | ICD-10-CM | POA: Insufficient documentation

## 2014-10-31 DIAGNOSIS — M5136 Other intervertebral disc degeneration, lumbar region: Secondary | ICD-10-CM | POA: Diagnosis not present

## 2014-10-31 DIAGNOSIS — M545 Low back pain, unspecified: Secondary | ICD-10-CM

## 2014-11-16 DIAGNOSIS — M5136 Other intervertebral disc degeneration, lumbar region: Secondary | ICD-10-CM | POA: Insufficient documentation

## 2014-11-16 DIAGNOSIS — M51369 Other intervertebral disc degeneration, lumbar region without mention of lumbar back pain or lower extremity pain: Secondary | ICD-10-CM | POA: Insufficient documentation

## 2014-11-30 DIAGNOSIS — M48062 Spinal stenosis, lumbar region with neurogenic claudication: Secondary | ICD-10-CM | POA: Insufficient documentation

## 2014-12-01 ENCOUNTER — Ambulatory Visit
Admission: RE | Admit: 2014-12-01 | Discharge: 2014-12-01 | Disposition: A | Discharge status: Home / Self Care | Payer: BC Managed Care – PPO | Source: Ambulatory Visit | Attending: Unknown Physician Specialty | Admitting: Unknown Physician Specialty

## 2014-12-01 ENCOUNTER — Ambulatory Visit: Payer: Self-pay

## 2014-12-01 DIAGNOSIS — R921 Mammographic calcification found on diagnostic imaging of breast: Secondary | ICD-10-CM

## 2015-01-24 ENCOUNTER — Encounter
Admission: RE | Admit: 2015-01-24 | Discharge: 2015-01-24 | Disposition: A | Discharge status: Home / Self Care | Payer: BC Managed Care – PPO | Source: Ambulatory Visit | Attending: Orthopedic Surgery | Admitting: Orthopedic Surgery

## 2015-01-24 DIAGNOSIS — Z01818 Encounter for other preprocedural examination: Secondary | ICD-10-CM | POA: Diagnosis present

## 2015-01-24 HISTORY — DX: Pain, unspecified: R52

## 2015-01-24 HISTORY — DX: Anxiety disorder, unspecified: F41.9

## 2015-01-24 HISTORY — DX: Personal history of other diseases of the digestive system: Z87.19

## 2015-01-24 HISTORY — DX: Depression, unspecified: F32.A

## 2015-01-24 HISTORY — DX: Reserved for inherently not codable concepts without codable children: IMO0001

## 2015-01-24 HISTORY — DX: Sleep apnea, unspecified: G47.30

## 2015-01-24 HISTORY — DX: Major depressive disorder, single episode, unspecified: F32.9

## 2015-01-24 HISTORY — DX: Headache, unspecified: R51.9

## 2015-01-24 HISTORY — DX: Unspecified osteoarthritis, unspecified site: M19.90

## 2015-01-24 HISTORY — DX: Type 2 diabetes mellitus without complications: E11.9

## 2015-01-24 HISTORY — DX: Headache: R51

## 2015-01-24 LAB — PROTIME-INR
INR: 0.96
Prothrombin Time: 13 seconds (ref 11.4–15.0)

## 2015-01-24 LAB — CBC
HCT: 38.5 % (ref 35.0–47.0)
Hemoglobin: 13.1 g/dL (ref 12.0–16.0)
MCH: 29.2 pg (ref 26.0–34.0)
MCHC: 34.1 g/dL (ref 32.0–36.0)
MCV: 85.6 fL (ref 80.0–100.0)
Platelets: 262 10*3/uL (ref 150–440)
RBC: 4.49 MIL/uL (ref 3.80–5.20)
RDW: 13.9 % (ref 11.5–14.5)
WBC: 9.5 10*3/uL (ref 3.6–11.0)

## 2015-01-24 LAB — BASIC METABOLIC PANEL
Anion gap: 10 (ref 5–15)
BUN: 20 mg/dL (ref 6–20)
CO2: 28 mmol/L (ref 22–32)
CREATININE: 0.75 mg/dL (ref 0.44–1.00)
Calcium: 9.3 mg/dL (ref 8.9–10.3)
Chloride: 104 mmol/L (ref 101–111)
GFR calc non Af Amer: >60 mL/min (ref >60)
GLUCOSE: 138 mg/dL — AB (ref 65–99)
Potassium: 3.1 mmol/L — ABNORMAL LOW (ref 3.5–5.1)
Sodium: 142 mmol/L (ref 135–145)

## 2015-01-24 LAB — SURGICAL PCR SCREEN
MRSA, PCR: NEGATIVE
Staphylococcus aureus: NEGATIVE

## 2015-01-24 LAB — TYPE AND SCREEN
ABO/RH(D): O POS
Antibody Screen: NEGATIVE

## 2015-01-24 LAB — ABO/RH: ABO/RH(D): O POS

## 2015-01-24 LAB — APTT: APTT: 28 s (ref 24–36)

## 2015-01-24 LAB — SEDIMENTATION RATE: SED RATE: 35 mm/h — AB (ref 0–22)

## 2015-01-24 NOTE — Patient Instructions (Signed)
  Your procedure is scheduled on: 02/14/15 Report to Day Surgery. [facility] SECOND FLOOR To find out your arrival time please call [phone] between 1PM - 3PM on 02/13/15  Remember: Instructions that are not followed completely may result in serious medical risk, up to and including death, or upon the discretion of your surgeon and anesthesiologist your surgery may need to be rescheduled.    _X___ 1. Do not eat food or drink liquids after midnight. No gum chewing or hard candies.     __X__ 2. No Alcohol for 24 hours before or after surgery.   ____ 3. Bring all medications with you on the day of surgery if instructed.    __X__ 4. Notify your doctor if there is any change in your medical condition     (cold, fever, infections).     Do not wear jewelry, make-up, hairpins, clips or nail polish.  Do not wear lotions, powders, or perfumes. You may wear deodorant.  Do not shave 48 hours prior to surgery. Men may shave face and neck.  Do not bring valuables to the hospital.    [facility] is not responsible for any belongings or valuables.               Contacts, dentures or bridgework may not be worn into surgery.  Leave your suitcase in the car. After surgery it may be brought to your room.  For patients admitted to the hospital, discharge time is determined by your                treatment team.   Patients discharged the day of surgery will not be allowed to drive home.   Please read over the following fact sheets that you were given:   Surgical Site Infection Prevention   ____ Take these medicines the morning of surgery with A SIP OF WATER:    1. ALPRAZOLAM  2. GABAPENTIN  3. PRILOSEC  4.  5.  6.  ____ Fleet Enema (as directed)   _X___ Use CHG Soap as directed  _X___ Use inhalers on the day of surgery  ____ Stop metformin 2 days prior to surgery    ____ Take 1/2 of usual insulin dose the night before surgery and none on the morning of surgery.   ___ Stop  Coumadin/Plavix/aspirin on  _X___ Stop Anti-inflammatories on   STOP MOBIC 1 WEEK BEFORE SURGERY   ____ Stop supplements until after surgery.    _X___ Bring C-Pap to the hospital.

## 2015-01-25 NOTE — OR Nursing (Signed)
Kt 3.1 called to Dr Ernest Pine office. Recheck am surgery

## 2015-01-26 LAB — LATEX, IGE: Latex: <0.1 kU/L

## 2015-01-30 NOTE — OR Nursing (Signed)
Left message at home and mobile number need urine spec repeat.

## 2015-01-31 ENCOUNTER — Other Ambulatory Visit: Payer: BC Managed Care – PPO

## 2015-02-06 ENCOUNTER — Encounter
Admission: RE | Admit: 2015-02-06 | Discharge: 2015-02-06 | Disposition: A | Discharge status: Home / Self Care | Payer: BC Managed Care – PPO | Source: Ambulatory Visit | Attending: Orthopedic Surgery | Admitting: Orthopedic Surgery

## 2015-02-06 DIAGNOSIS — Z01812 Encounter for preprocedural laboratory examination: Secondary | ICD-10-CM | POA: Insufficient documentation

## 2015-02-06 LAB — URINALYSIS COMPLETE WITH MICROSCOPIC (ARMC ONLY)
Bilirubin Urine: NEGATIVE
Glucose, UA: NEGATIVE mg/dL
Hgb urine dipstick: NEGATIVE
KETONES UR: NEGATIVE mg/dL
Leukocytes, UA: NEGATIVE
NITRITE: NEGATIVE
PH: 5 (ref 5.0–8.0)
PROTEIN: NEGATIVE mg/dL
RBC / HPF: NONE SEEN RBC/hpf (ref 0–5)
Specific Gravity, Urine: 1.006 (ref 1.005–1.030)

## 2015-02-07 LAB — URINE CULTURE

## 2015-02-12 NOTE — OR Nursing (Signed)
Urine culture report faxed to Dr Ernest Pine

## 2015-02-14 ENCOUNTER — Inpatient Hospital Stay: Payer: BC Managed Care – PPO | Admitting: Anesthesiology

## 2015-02-14 ENCOUNTER — Encounter
Admission: RE | Disposition: A | Discharge status: Skilled Nursing Facility | Payer: Self-pay | Source: Ambulatory Visit | Attending: Orthopedic Surgery

## 2015-02-14 ENCOUNTER — Encounter: Payer: Self-pay | Admitting: *Deleted

## 2015-02-14 ENCOUNTER — Inpatient Hospital Stay: Payer: BC Managed Care – PPO

## 2015-02-14 ENCOUNTER — Inpatient Hospital Stay
Admission: RE | Admit: 2015-02-14 | Discharge: 2015-02-17 | DRG: 470 | Disposition: A | Discharge status: Skilled Nursing Facility | Payer: BC Managed Care – PPO | Source: Ambulatory Visit | Attending: Orthopedic Surgery | Admitting: Orthopedic Surgery

## 2015-02-14 DIAGNOSIS — Z79899 Other long term (current) drug therapy: Secondary | ICD-10-CM | POA: Diagnosis not present

## 2015-02-14 DIAGNOSIS — E876 Hypokalemia: Secondary | ICD-10-CM | POA: Diagnosis present

## 2015-02-14 DIAGNOSIS — F329 Major depressive disorder, single episode, unspecified: Secondary | ICD-10-CM | POA: Diagnosis present

## 2015-02-14 DIAGNOSIS — I10 Essential (primary) hypertension: Secondary | ICD-10-CM | POA: Diagnosis present

## 2015-02-14 DIAGNOSIS — K219 Gastro-esophageal reflux disease without esophagitis: Secondary | ICD-10-CM | POA: Diagnosis present

## 2015-02-14 DIAGNOSIS — Z6841 Body Mass Index (BMI) 40.0 and over, adult: Secondary | ICD-10-CM

## 2015-02-14 DIAGNOSIS — F419 Anxiety disorder, unspecified: Secondary | ICD-10-CM | POA: Diagnosis present

## 2015-02-14 DIAGNOSIS — Z96649 Presence of unspecified artificial hip joint: Secondary | ICD-10-CM

## 2015-02-14 DIAGNOSIS — Z91040 Latex allergy status: Secondary | ICD-10-CM

## 2015-02-14 DIAGNOSIS — G4730 Sleep apnea, unspecified: Secondary | ICD-10-CM | POA: Diagnosis present

## 2015-02-14 DIAGNOSIS — E119 Type 2 diabetes mellitus without complications: Secondary | ICD-10-CM | POA: Diagnosis present

## 2015-02-14 DIAGNOSIS — M1611 Unilateral primary osteoarthritis, right hip: Secondary | ICD-10-CM | POA: Diagnosis present

## 2015-02-14 DIAGNOSIS — J45909 Unspecified asthma, uncomplicated: Secondary | ICD-10-CM | POA: Diagnosis present

## 2015-02-14 DIAGNOSIS — Z91048 Other nonmedicinal substance allergy status: Secondary | ICD-10-CM

## 2015-02-14 HISTORY — PX: TOTAL HIP ARTHROPLASTY: SHX124

## 2015-02-14 LAB — POCT I-STAT 4, (NA,K, GLUC, HGB,HCT)
Glucose, Bld: 122 mg/dL — ABNORMAL HIGH (ref 65–99)
HCT: 39 % (ref 36.0–46.0)
HEMOGLOBIN: 13.3 g/dL (ref 12.0–15.0)
Potassium: 4.4 mmol/L (ref 3.5–5.1)
Sodium: 139 mmol/L (ref 135–145)

## 2015-02-14 LAB — URINALYSIS COMPLETE WITH MICROSCOPIC (ARMC ONLY)
BILIRUBIN URINE: NEGATIVE
Bacteria, UA: NONE SEEN
GLUCOSE, UA: NEGATIVE mg/dL
Ketones, ur: NEGATIVE mg/dL
LEUKOCYTES UA: NEGATIVE
Nitrite: NEGATIVE
Protein, ur: 30 mg/dL — AB
Specific Gravity, Urine: 1.033 — ABNORMAL HIGH (ref 1.005–1.030)
pH: 5 (ref 5.0–8.0)

## 2015-02-14 SURGERY — ARTHROPLASTY, HIP, TOTAL,POSTERIOR APPROACH
Anesthesia: Monitor Anesthesia Care | Site: Hip | Laterality: Right | Wound class: Clean

## 2015-02-14 MED ORDER — SALINE SPRAY 0.65 % NA SOLN: 1.0000 | NASAL | Status: DC | PRN

## 2015-02-14 MED ORDER — TETRACAINE HCL 1 % IJ SOLN
INTRAMUSCULAR | Status: DC | PRN
  Administered 2015-02-14: 10 mg via INTRASPINAL

## 2015-02-14 MED ORDER — OXYCODONE HCL 5 MG PO TABS: 5.0000 mg | ORAL_TABLET | Freq: Once | ORAL | Status: DC | PRN

## 2015-02-14 MED ORDER — ONDANSETRON HCL 4 MG PO TABS: 4.0000 mg | ORAL_TABLET | Freq: Four times a day (QID) | ORAL | Status: DC | PRN

## 2015-02-14 MED ORDER — LORATADINE-PSEUDOEPHEDRINE ER 5-120 MG PO TB12: 1.0000 | ORAL_TABLET | Freq: Every day | ORAL | Status: DC

## 2015-02-14 MED ORDER — ALUM & MAG HYDROXIDE-SIMETH 200-200-20 MG/5ML PO SUSP: 30.0000 mL | ORAL | Status: DC | PRN

## 2015-02-14 MED ORDER — DIPHENHYDRAMINE HCL 12.5 MG/5ML PO ELIX: 12.5000 mg | ORAL_SOLUTION | ORAL | Status: DC | PRN

## 2015-02-14 MED ORDER — PNEUMOCOCCAL VAC POLYVALENT 25 MCG/0.5ML IJ INJ
0.5000 mL | INJECTION | INTRAMUSCULAR | Status: AC
Start: 2015-02-15 — End: 2015-02-15
  Administered 2015-02-15: 0.5 mL via INTRAMUSCULAR
  Filled 2015-02-14: qty 0.5

## 2015-02-14 MED ORDER — SODIUM CHLORIDE 0.9 % IV SOLN
INTRAVENOUS | Status: DC
  Administered 2015-02-14: 15:00:00 via INTRAVENOUS

## 2015-02-14 MED ORDER — ACETAMINOPHEN 10 MG/ML IV SOLN
1000.0000 mg | Freq: Four times a day (QID) | INTRAVENOUS | Status: AC
  Administered 2015-02-14 – 2015-02-15 (×4): 1000 mg via INTRAVENOUS
  Filled 2015-02-14 (×4): qty 100

## 2015-02-14 MED ORDER — ACETAMINOPHEN 10 MG/ML IV SOLN
INTRAVENOUS | Status: AC
  Filled 2015-02-14: qty 100

## 2015-02-14 MED ORDER — FENTANYL CITRATE (PF) 100 MCG/2ML IJ SOLN
INTRAMUSCULAR | Status: DC | PRN
  Administered 2015-02-14 (×2): 50 ug via INTRAVENOUS

## 2015-02-14 MED ORDER — MIDAZOLAM HCL 5 MG/5ML IJ SOLN
INTRAMUSCULAR | Status: DC | PRN
  Administered 2015-02-14 (×2): 1 mg via INTRAVENOUS

## 2015-02-14 MED ORDER — TETRACAINE HCL 1 % IJ SOLN
INTRAMUSCULAR | Status: AC
Start: 2015-02-14 — End: 2015-02-14
  Filled 2015-02-14: qty 2

## 2015-02-14 MED ORDER — TRANEXAMIC ACID 1000 MG/10ML IV SOLN
1000.0000 mg | INTRAVENOUS | Status: AC
  Administered 2015-02-14: 1000 mg via INTRAVENOUS
  Filled 2015-02-14: qty 10

## 2015-02-14 MED ORDER — ALPRAZOLAM 0.25 MG PO TABS
0.2500 mg | ORAL_TABLET | Freq: Every day | ORAL | Status: DC
  Administered 2015-02-15 – 2015-02-17 (×3): 0.25 mg via ORAL
  Filled 2015-02-14 (×3): qty 1

## 2015-02-14 MED ORDER — NEOMYCIN-POLYMYXIN B GU 40-200000 IR SOLN
Status: DC | PRN
  Administered 2015-02-14: 16 mL

## 2015-02-14 MED ORDER — CALCIUM CARBONATE-VITAMIN D 500-200 MG-UNIT PO TABS
1.0000 | ORAL_TABLET | Freq: Two times a day (BID) | ORAL | Status: DC
  Administered 2015-02-14 – 2015-02-17 (×6): 1 via ORAL
  Filled 2015-02-14 (×6): qty 1

## 2015-02-14 MED ORDER — SODIUM CHLORIDE 0.9 % IV SOLN
INTRAVENOUS | Status: DC
  Administered 2015-02-14 (×2): via INTRAVENOUS

## 2015-02-14 MED ORDER — PROPOFOL 10 MG/ML IV BOLUS
INTRAVENOUS | Status: DC | PRN
Start: 2015-02-14 — End: 2015-02-14

## 2015-02-14 MED ORDER — OXYCODONE HCL 5 MG/5ML PO SOLN: 5.0000 mg | Freq: Once | ORAL | Status: DC | PRN

## 2015-02-14 MED ORDER — FENTANYL CITRATE (PF) 100 MCG/2ML IJ SOLN
25.0000 ug | INTRAMUSCULAR | Status: AC | PRN
  Administered 2015-02-14 (×6): 25 ug via INTRAVENOUS

## 2015-02-14 MED ORDER — TIOTROPIUM BROMIDE MONOHYDRATE 18 MCG IN CAPS
1.0000 | ORAL_CAPSULE | Freq: Every day | RESPIRATORY_TRACT | Status: DC
  Administered 2015-02-15 – 2015-02-17 (×3): 18 ug via RESPIRATORY_TRACT
  Filled 2015-02-14: qty 5

## 2015-02-14 MED ORDER — LORATADINE 10 MG PO TABS
10.0000 mg | ORAL_TABLET | Freq: Every day | ORAL | Status: DC
  Administered 2015-02-15 – 2015-02-17 (×3): 10 mg via ORAL
  Filled 2015-02-14 (×3): qty 1

## 2015-02-14 MED ORDER — VALACYCLOVIR HCL 500 MG PO TABS
1000.0000 mg | ORAL_TABLET | Freq: Two times a day (BID) | ORAL | Status: DC | PRN
  Filled 2015-02-14: qty 2

## 2015-02-14 MED ORDER — PHENYLEPHRINE HCL 10 MG/ML IJ SOLN
INTRAMUSCULAR | Status: DC | PRN
  Administered 2015-02-14: 100 ug via INTRAVENOUS
  Administered 2015-02-14: 150 ug via INTRAVENOUS

## 2015-02-14 MED ORDER — CLINDAMYCIN PHOSPHATE 900 MG/50ML IV SOLN: 900.0000 mg | INTRAVENOUS | Status: DC

## 2015-02-14 MED ORDER — BUPIVACAINE HCL (PF) 0.5 % IJ SOLN
INTRAMUSCULAR | Status: DC | PRN
  Administered 2015-02-14: 2 mL

## 2015-02-14 MED ORDER — CLINDAMYCIN PHOSPHATE 900 MG/50ML IV SOLN
INTRAVENOUS | Status: AC
  Administered 2015-02-14: 900 mg via INTRAVENOUS
  Filled 2015-02-14: qty 50

## 2015-02-14 MED ORDER — FENTANYL CITRATE (PF) 100 MCG/2ML IJ SOLN
INTRAMUSCULAR | Status: AC
  Filled 2015-02-14: qty 2

## 2015-02-14 MED ORDER — ACETAMINOPHEN 10 MG/ML IV SOLN
INTRAVENOUS | Status: DC | PRN
Start: 2015-02-14 — End: 2015-02-14
  Administered 2015-02-14: 1000 mg via INTRAVENOUS

## 2015-02-14 MED ORDER — MOMETASONE FURO-FORMOTEROL FUM 100-5 MCG/ACT IN AERO
2.0000 | INHALATION_SPRAY | Freq: Two times a day (BID) | RESPIRATORY_TRACT | Status: DC
  Administered 2015-02-14 – 2015-02-16 (×5): 2 via RESPIRATORY_TRACT
  Filled 2015-02-14: qty 8.8

## 2015-02-14 MED ORDER — CALCIUM CARBONATE ANTACID 500 MG PO CHEW
1.0000 | CHEWABLE_TABLET | Freq: Two times a day (BID) | ORAL | Status: DC | PRN
  Administered 2015-02-14: 200 mg via ORAL
  Filled 2015-02-14: qty 1

## 2015-02-14 MED ORDER — METOCLOPRAMIDE HCL 10 MG PO TABS
10.0000 mg | ORAL_TABLET | Freq: Three times a day (TID) | ORAL | Status: AC
  Administered 2015-02-14 – 2015-02-16 (×8): 10 mg via ORAL
  Filled 2015-02-14 (×9): qty 1

## 2015-02-14 MED ORDER — PHENOL 1.4 % MT LIQD: 1.0000 | OROMUCOSAL | Status: DC | PRN

## 2015-02-14 MED ORDER — FERROUS SULFATE 325 (65 FE) MG PO TABS
325.0000 mg | ORAL_TABLET | Freq: Two times a day (BID) | ORAL | Status: DC
  Administered 2015-02-14 – 2015-02-17 (×6): 325 mg via ORAL
  Filled 2015-02-14 (×6): qty 1

## 2015-02-14 MED ORDER — ACETAMINOPHEN 325 MG PO TABS: 650.0000 mg | ORAL_TABLET | Freq: Four times a day (QID) | ORAL | Status: DC | PRN

## 2015-02-14 MED ORDER — TRAMADOL HCL 50 MG PO TABS
50.0000 mg | ORAL_TABLET | ORAL | Status: DC | PRN
  Administered 2015-02-15 – 2015-02-16 (×2): 50 mg via ORAL
  Administered 2015-02-17 (×2): 100 mg via ORAL
  Filled 2015-02-14 (×2): qty 1
  Filled 2015-02-14 (×2): qty 2

## 2015-02-14 MED ORDER — SENNOSIDES-DOCUSATE SODIUM 8.6-50 MG PO TABS
1.0000 | ORAL_TABLET | Freq: Two times a day (BID) | ORAL | Status: DC
  Administered 2015-02-14 – 2015-02-17 (×6): 1 via ORAL
  Filled 2015-02-14 (×6): qty 1

## 2015-02-14 MED ORDER — CHOLESTYRAMINE LIGHT 4 G PO PACK
2.0000 g | PACK | Freq: Every day | ORAL | Status: DC | PRN
  Filled 2015-02-14: qty 1

## 2015-02-14 MED ORDER — ONDANSETRON HCL 4 MG/2ML IJ SOLN: 4.0000 mg | Freq: Four times a day (QID) | INTRAMUSCULAR | Status: DC | PRN

## 2015-02-14 MED ORDER — TIZANIDINE HCL 4 MG PO TABS
4.0000 mg | ORAL_TABLET | Freq: Every evening | ORAL | Status: DC | PRN
  Administered 2015-02-14: 4 mg via ORAL
  Filled 2015-02-14: qty 1

## 2015-02-14 MED ORDER — ACETAMINOPHEN 650 MG RE SUPP: 650.0000 mg | Freq: Four times a day (QID) | RECTAL | Status: DC | PRN

## 2015-02-14 MED ORDER — CHLORHEXIDINE GLUCONATE 4 % EX LIQD
60.0000 mL | Freq: Once | CUTANEOUS | Status: AC
  Administered 2015-02-14: 4 via TOPICAL

## 2015-02-14 MED ORDER — MENTHOL 3 MG MT LOZG
1.0000 | LOZENGE | OROMUCOSAL | Status: DC | PRN
Start: 2015-02-14 — End: 2015-02-17

## 2015-02-14 MED ORDER — ADULT MULTIVITAMIN W/MINERALS CH
1.0000 | ORAL_TABLET | Freq: Every day | ORAL | Status: DC
  Administered 2015-02-14 – 2015-02-17 (×4): 1 via ORAL
  Filled 2015-02-14 (×7): qty 1

## 2015-02-14 MED ORDER — OXYCODONE HCL 5 MG PO TABS
5.0000 mg | ORAL_TABLET | ORAL | Status: DC | PRN
  Administered 2015-02-14: 5 mg via ORAL
  Administered 2015-02-14 – 2015-02-16 (×8): 10 mg via ORAL
  Filled 2015-02-14 (×6): qty 2
  Filled 2015-02-14: qty 1
  Filled 2015-02-14 (×3): qty 2

## 2015-02-14 MED ORDER — TORSEMIDE 20 MG PO TABS
20.0000 mg | ORAL_TABLET | Freq: Every day | ORAL | Status: DC
  Administered 2015-02-14 – 2015-02-17 (×4): 20 mg via ORAL
  Filled 2015-02-14 (×4): qty 1

## 2015-02-14 MED ORDER — NEOMYCIN-POLYMYXIN B GU 40-200000 IR SOLN
Status: AC
  Filled 2015-02-14: qty 20

## 2015-02-14 MED ORDER — PSEUDOEPHEDRINE HCL ER 120 MG PO TB12
120.0000 mg | ORAL_TABLET | Freq: Every day | ORAL | Status: DC
  Administered 2015-02-15 – 2015-02-16 (×2): 120 mg via ORAL
  Filled 2015-02-14 (×4): qty 1

## 2015-02-14 MED ORDER — ENOXAPARIN SODIUM 30 MG/0.3ML ~~LOC~~ SOLN
30.0000 mg | Freq: Two times a day (BID) | SUBCUTANEOUS | Status: DC
  Administered 2015-02-15 – 2015-02-17 (×5): 30 mg via SUBCUTANEOUS
  Filled 2015-02-14 (×5): qty 0.3

## 2015-02-14 MED ORDER — TRANEXAMIC ACID 1000 MG/10ML IV SOLN
1000.0000 mg | Freq: Once | INTRAVENOUS | Status: AC
  Administered 2015-02-14: 1000 mg via INTRAVENOUS
  Filled 2015-02-14: qty 10

## 2015-02-14 MED ORDER — POTASSIUM CHLORIDE ER 10 MEQ PO TBCR
10.0000 meq | EXTENDED_RELEASE_TABLET | Freq: Every day | ORAL | Status: DC
  Administered 2015-02-14 – 2015-02-17 (×4): 10 meq via ORAL
  Filled 2015-02-14 (×7): qty 1

## 2015-02-14 MED ORDER — BISACODYL 10 MG RE SUPP
10.0000 mg | Freq: Every day | RECTAL | Status: DC | PRN
  Administered 2015-02-17: 10 mg via RECTAL
  Filled 2015-02-14: qty 1

## 2015-02-14 MED ORDER — SODIUM CHLORIDE 0.9 % IV SOLN
10000.0000 ug | INTRAVENOUS | Status: DC | PRN
  Administered 2015-02-14: 10 ug/min via INTRAVENOUS

## 2015-02-14 MED ORDER — MORPHINE SULFATE (PF) 2 MG/ML IV SOLN
2.0000 mg | INTRAVENOUS | Status: DC | PRN
  Administered 2015-02-14: 2 mg via INTRAVENOUS
  Filled 2015-02-14 (×2): qty 1

## 2015-02-14 MED ORDER — FLEET ENEMA 7-19 GM/118ML RE ENEM: 1.0000 | ENEMA | Freq: Once | RECTAL | Status: DC | PRN

## 2015-02-14 MED ORDER — GABAPENTIN 300 MG PO CAPS
600.0000 mg | ORAL_CAPSULE | Freq: Two times a day (BID) | ORAL | Status: DC
  Administered 2015-02-14 – 2015-02-17 (×6): 600 mg via ORAL
  Filled 2015-02-14 (×6): qty 2

## 2015-02-14 MED ORDER — ALBUTEROL SULFATE (2.5 MG/3ML) 0.083% IN NEBU: 3.0000 mL | INHALATION_SOLUTION | Freq: Three times a day (TID) | RESPIRATORY_TRACT | Status: DC | PRN

## 2015-02-14 MED ORDER — CLINDAMYCIN PHOSPHATE 600 MG/50ML IV SOLN
600.0000 mg | Freq: Four times a day (QID) | INTRAVENOUS | Status: AC
  Administered 2015-02-14 – 2015-02-15 (×4): 600 mg via INTRAVENOUS
  Filled 2015-02-14 (×4): qty 50

## 2015-02-14 MED ORDER — MAGNESIUM HYDROXIDE 400 MG/5ML PO SUSP
30.0000 mL | Freq: Every day | ORAL | Status: DC | PRN
Start: 2015-02-14 — End: 2015-02-17
  Administered 2015-02-15: 30 mL via ORAL
  Filled 2015-02-14: qty 30

## 2015-02-14 MED ORDER — PROPOFOL 500 MG/50ML IV EMUL
INTRAVENOUS | Status: DC | PRN
  Administered 2015-02-14: 35 ug/kg/min via INTRAVENOUS

## 2015-02-14 SURGICAL SUPPLY — 52 items
BLADE DRUM FLTD (BLADE) ×3 IMPLANT
BLADE SAW 1 (BLADE) ×3 IMPLANT
CANISTER SUCT 1200ML W/VALVE (MISCELLANEOUS) ×3 IMPLANT
CANISTER SUCT 3000ML (MISCELLANEOUS) ×6 IMPLANT
CAPT HIP TOTAL 2 ×3 IMPLANT
CARTRIDGE OIL MAESTRO DRILL (MISCELLANEOUS) ×1 IMPLANT
CATH FOL LEG HOLDER (MISCELLANEOUS) ×3 IMPLANT
CATH TRAY METER 16FR LF (MISCELLANEOUS) ×3 IMPLANT
DIFFUSER MAESTRO (MISCELLANEOUS) ×3 IMPLANT
DRAPE INCISE IOBAN 66X60 STRL (DRAPES) ×3 IMPLANT
DRAPE SHEET LG 3/4 BI-LAMINATE (DRAPES) ×3 IMPLANT
DRAPE TABLE BACK 80X90 (DRAPES) ×3 IMPLANT
DRSG DERMACEA 8X12 NADH (GAUZE/BANDAGES/DRESSINGS) ×3 IMPLANT
DRSG OPSITE POSTOP 3X4 (GAUZE/BANDAGES/DRESSINGS) ×3 IMPLANT
DRSG OPSITE POSTOP 4X12 (GAUZE/BANDAGES/DRESSINGS) ×3 IMPLANT
DRSG OPSITE POSTOP 4X14 (GAUZE/BANDAGES/DRESSINGS) ×3 IMPLANT
DRSG TEGADERM 4X4.75 (GAUZE/BANDAGES/DRESSINGS) ×3 IMPLANT
DURAPREP 26ML APPLICATOR (WOUND CARE) ×3 IMPLANT
ELECT BLADE 6.5 EXT (BLADE) ×3 IMPLANT
ELECT CAUTERY BLADE 6.4 (BLADE) ×3 IMPLANT
GLOVE BIOGEL M STRL SZ7.5 (GLOVE) ×3 IMPLANT
GLOVE INDICATOR 8.0 STRL GRN (GLOVE) ×3 IMPLANT
GLOVE SURG 9.0 ORTHO LTXF (GLOVE) ×3 IMPLANT
GLOVE SURG ORTHO 9.0 STRL STRW (GLOVE) ×3 IMPLANT
GOWN STRL REUS W/ TWL LRG LVL3 (GOWN DISPOSABLE) ×1 IMPLANT
GOWN STRL REUS W/ TWL LRG LVL4 (GOWN DISPOSABLE) ×1 IMPLANT
GOWN STRL REUS W/TWL 2XL LVL3 (GOWN DISPOSABLE) ×3 IMPLANT
GOWN STRL REUS W/TWL LRG LVL3 (GOWN DISPOSABLE) ×2
GOWN STRL REUS W/TWL LRG LVL4 (GOWN DISPOSABLE) ×2
HANDPIECE SUCTION TUBG SURGILV (MISCELLANEOUS) ×3 IMPLANT
HEMOVAC 400CC 10FR (MISCELLANEOUS) ×3 IMPLANT
HOOD PEEL AWAY FACE SHEILD DIS (HOOD) ×6 IMPLANT
KIT RM TURNOVER STRD PROC AR (KITS) ×3 IMPLANT
NDL SAFETY 18GX1.5 (NEEDLE) ×3 IMPLANT
NS IRRIG 1000ML POUR BTL (IV SOLUTION) ×3 IMPLANT
OIL CARTRIDGE MAESTRO DRILL (MISCELLANEOUS) ×3
PACK HIP PROSTHESIS (MISCELLANEOUS) ×3 IMPLANT
SOL .9 NS 3000ML IRR  AL (IV SOLUTION) ×2
SOL .9 NS 3000ML IRR UROMATIC (IV SOLUTION) ×1 IMPLANT
SOL PREP PVP 2OZ (MISCELLANEOUS) ×3
SOLUTION PREP PVP 2OZ (MISCELLANEOUS) ×1 IMPLANT
SPONGE DRAIN TRACH 4X4 STRL 2S (GAUZE/BANDAGES/DRESSINGS) ×3 IMPLANT
STAPLER SKIN PROX 35W (STAPLE) ×3 IMPLANT
SUT ETHIBOND #5 BRAIDED 30INL (SUTURE) ×3 IMPLANT
SUT VIC AB 0 CT1 36 (SUTURE) ×3 IMPLANT
SUT VIC AB 1 CT1 36 (SUTURE) ×6 IMPLANT
SUT VIC AB 2-0 CT1 27 (SUTURE) ×2
SUT VIC AB 2-0 CT1 TAPERPNT 27 (SUTURE) ×1 IMPLANT
SYR 20CC LL (SYRINGE) ×3 IMPLANT
TAPE ADH 3 LX (MISCELLANEOUS) ×3 IMPLANT
TAPE TRANSPORE STRL 2 31045 (GAUZE/BANDAGES/DRESSINGS) ×3 IMPLANT
WATER STERILE IRR 1000ML POUR (IV SOLUTION) ×6 IMPLANT

## 2015-02-14 NOTE — Anesthesia Postprocedure Evaluation (Signed)
  Anesthesia Post-op Note  Patient: Deborah Miller  Procedure(s) Performed: Procedure(s): TOTAL HIP ARTHROPLASTY (Right)  Anesthesia type:MAC, Spinal  Patient location: PACU  Post pain: Pain level controlled  Post assessment: Post-op Vital signs reviewed, Patient's Cardiovascular Status Stable, Respiratory Function Stable, Patent Airway and No signs of Nausea or vomiting  Post vital signs: Reviewed and stable  Last Vitals:  Filed Vitals:   02/14/15 1404  BP: 106/58  Pulse: 85  Temp: 36.4 C  Resp: 18    Level of consciousness: awake, alert  and patient cooperative  Complications: No apparent anesthesia complications

## 2015-02-14 NOTE — Progress Notes (Signed)
Patient complain of numbness of right lower leg -below the knee, able to do ankle pumps ble, spoke with Dr.Hooten- flex the right knee. Will continue to monitor the patient.

## 2015-02-14 NOTE — Anesthesia Preprocedure Evaluation (Signed)
Anesthesia Evaluation  Patient identified by MRN, date of birth, ID band Patient awake    Reviewed: Allergy & Precautions, H&P , NPO status , Patient's Chart, lab work & pertinent test results  History of Anesthesia Complications Negative for: history of anesthetic complications  Airway Mallampati: III  TM Distance: >3 FB Neck ROM: limited    Dental no notable dental hx. (+) Teeth Intact   Pulmonary neg shortness of breath, asthma , sleep apnea and Continuous Positive Airway Pressure Ventilation , former smoker,    Pulmonary exam normal breath sounds clear to auscultation       Cardiovascular Exercise Tolerance: Good hypertension, (-) Past MI Normal cardiovascular exam Rhythm:regular Rate:Normal     Neuro/Psych  Headaches, PSYCHIATRIC DISORDERS Anxiety Depression  Neuromuscular disease    GI/Hepatic Neg liver ROS, hiatal hernia, GERD  Controlled,  Endo/Other  diabetes, Type 2  Renal/GU negative Renal ROS  negative genitourinary   Musculoskeletal negative musculoskeletal ROS (+) Arthritis ,   Abdominal   Peds negative pediatric ROS (+)  Hematology negative hematology ROS (+)   Anesthesia Other Findings Past Medical History:   Asthma                                          1957         Hemorrhoids                                     2012         Hypertension                                                 Breast screening, unspecified                                Acid reflux                                                  Fecal smearing                                               Breast microcalcification, mammographic         2014         Sleep apnea                                                    Comment:cpap   Shortness of breath dyspnea                                    Comment:increased with anxiety   Depression  Anxiety                                                       History of hiatal hernia                                     Headache                                                     Carpal tunnel syndrome                                       Arthritis                                                    Diabetes mellitus without complication (HCC)                 Pain                                                           Comment:chronic lbp,stenosis Morbid Obesity Low Back pain  BMI    Body Mass Index   44.26 kg/m 2      Reproductive/Obstetrics negative OB ROS                             Anesthesia Physical Anesthesia Plan  ASA: III  Anesthesia Plan: MAC and Spinal   Post-op Pain Management:    Induction:   Airway Management Planned:   Additional Equipment:   Intra-op Plan:   Post-operative Plan:   Informed Consent: I have reviewed the patients History and Physical, chart, labs and discussed the procedure including the risks, benefits and alternatives for the proposed anesthesia with the patient or authorized representative who has indicated his/her understanding and acceptance.   Dental Advisory Given  Plan Discussed with: Anesthesiologist, CRNA and Surgeon  Anesthesia Plan Comments:         Anesthesia Quick Evaluation

## 2015-02-14 NOTE — Progress Notes (Signed)
X Ray performed in PACU as ordered.

## 2015-02-14 NOTE — H&P (Signed)
The patient has been re-examined, and the chart reviewed, and there have been no interval changes to the documented history and physical.    The risks, benefits, and alternatives have been discussed at length. The patient expressed understanding of the risks benefits and agreed with plans for surgical intervention.  James P. Hooten, Jr. M.D.    

## 2015-02-14 NOTE — Op Note (Signed)
OPERATIVE NOTE  DATE OF SURGERY:  02/14/2015  PATIENT NAME:  Deborah Miller   DOB: [birth year]  MRN: [id number]  PRE-OPERATIVE DIAGNOSIS: Degenerative arthrosis of the right hip, primary  POST-OPERATIVE DIAGNOSIS:  Same  PROCEDURE:  Right total hip arthroplasty  SURGEON:  Jena Gauss. M.D.  ASSISTANT:  Van Clines, PA (present and scrubbed throughout the case, critical for assistance with exposure, retraction, instrumentation, and closure)  ANESTHESIA: spinal  ESTIMATED BLOOD LOSS: 450 mL  FLUIDS REPLACED: 1500 mL of crystalloid  DRAINS: 2 medium drains to a Hemovac reservoir  IMPLANTS UTILIZED: DePuy 10.5 mm small stature AML femoral stem, 48 mm OD Pinnacle Gription Sector acetabular component, +4 mm 10 Pinnacle Marathon polyethylene insert, and a 32 mm CoCr +1 mm hip ball  INDICATIONS FOR SURGERY: Deborah Miller is a 59 y.o. year old female with a long history of progressive hip and groin  pain. X-rays demonstrated severe degenerative changes. The patient had not seen any significant improvement despite conservative nonsurgical intervention. After discussion of the risks and benefits of surgical intervention, the patient expressed understanding of the risks benefits and agree with plans for total hip arthroplasty.   The risks, benefits, and alternatives were discussed at length including but not limited to the risks of infection, bleeding, nerve injury, stiffness, blood clots, the need for revision surgery, limb length inequality, dislocation, cardiopulmonary complications, among others, and they were willing to proceed.  PROCEDURE IN DETAIL: The patient was brought into the operating room and, after adequate spinal anesthesia was achieved, the patient was placed in a left lateral decubitus position. Axillary roll was placed and all bony prominences were well-padded. The patient's right hip was cleaned and prepped with alcohol and DuraPrep and draped in the usual sterile  fashion. A "timeout" was performed as per usual protocol. A lateral curvilinear incision was made gently curving towards the posterior superior iliac spine. The IT band was incised in line with the skin incision and the fibers of the gluteus maximus were split in line. The piriformis tendon was identified, skeletonized, and incised at its insertion to the proximal femur and reflected posteriorly. A T type posterior capsulotomy was performed. Prior to dislocation of the femoral head, a threaded Steinmann pin was inserted through a separate stab incision into the pelvis superior to the acetabulum and bent in the form of a stylus so as to assess limb length and hip offset throughout the procedure. The femoral head was then dislocated posteriorly. Inspection of the femoral head demonstrated severe degenerative changes with full-thickness loss of articular cartilage. The femoral neck cut was performed using an oscillating saw. The anterior capsule was elevated off of the femoral neck using a periosteal elevator. Attention was then directed to the acetabulum. The remnant of the labrum was excised using electrocautery. Inspection of the acetabulum also demonstrated significant degenerative changes. The acetabulum was reamed in sequential fashion up to a 47 mm diameter. Good punctate bleeding bone was encountered. A 48 mm Pinnacle Gription Sector acetabular component was positioned and impacted into place. Good scratch fit was appreciated. A +4 mm polyethylene trial was inserted.  Attention was then directed to the proximal femur. A hole for reaming of the proximal femoral canal was created using a high-speed burr. The femoral canal was reamed in sequential fashion up to a 10 mm diameter. This allowed for approximately 9 cm of scratch fit. It was thus elected to ream up to a 10.5 mm diameter so as to achieve a line  to line fit. Serial broaches were inserted up to a 12 mm small stature femoral broach. A trial reduction was  performed using a 32 mm hip ball with a +1 mm neck length. The +4 mm neutral liner was replaced by a +4 mm 10 liner with the high side placed at the 7:00 position. Good equalization of limb lengths and hip offset was appreciated and excellent stability was noted both anteriorly and posteriorly. Trial components were removed. The acetabular shell was irrigated with copious amounts of normal saline with antibiotic solution and suctioned dry. A +4 mm 10 Pinnacle Marathon polyethylene insert was positioned and impacted into place. Next, a 10.5 mm small stature AML femoral stem was positioned and impacted into place. Excellent scratch fit was appreciated. The Morse taper was cleaned and dried. A 32 mm cobalt chrome hip ball with a +1 mm neck length was placed on the trunnion and impacted into place. The hip was then reduced and placed through range of motion. Excellent stability was appreciated both anteriorly and posteriorly.  The wound was irrigated with copious amounts of normal saline with antibiotic solution and suctioned dry. Good hemostasis was appreciated. The posterior capsulotomy was repaired using #5 Ethibond. Piriformis tendon was reapproximated to the undersurface of the gluteus medius tendon using #5 Ethibond. Two medium drains were placed in the wound bed and brought out through separate stab incisions to be attached to a Hemovac reservoir. The IT band was reapproximated using interrupted sutures of #1 Vicryl. Subcutaneous tissue was proximal phalanx using first #0 Vicryl followed by #2-0 Vicryl. The skin was closed with skin staples.  The patient tolerated the procedure well and was transported to the recovery room in stable condition.   Jena GaussJames P Gloriajean Okun, Jr., M.D.

## 2015-02-14 NOTE — Anesthesia Procedure Notes (Signed)
Spinal Patient location during procedure: OR Start time: 02/14/2015 7:49 AM End time: 02/14/2015 7:46 AM Staffing Anesthesiologist: Rosaria FerriesPISCITELLO, JOSEPH K Resident/CRNA: Deborah JackWEATHERLY, Deborah Dicenzo Performed by: resident/CRNA  Preanesthetic Checklist Completed: patient identified, site marked, surgical consent, pre-op evaluation, timeout performed, IV checked, risks and benefits discussed and monitors and equipment checked Spinal Block Patient position: sitting Prep: Betadine and site prepped and draped Patient monitoring: heart rate, continuous pulse ox and blood pressure Approach: midline Location: L3-4 Injection technique: single-shot Needle Needle type: Whitacre  Needle gauge: 25 G Needle length: 9 cm Assessment Sensory level: T6

## 2015-02-14 NOTE — Brief Op Note (Signed)
02/14/2015  1:05 PM  PATIENT:  Deborah Miller  59 y.o. female  PRE-OPERATIVE DIAGNOSIS:  OSTEOARTHRITIS right hip  POST-OPERATIVE DIAGNOSIS:  same  PROCEDURE:  Procedure(s): TOTAL HIP ARTHROPLASTY (Right)  SURGEON:  Surgeon(s) and Role:    * Donato HeinzJames P Hillarie Harrigan, MD - Primary  ASSISTANTS: Van ClinesJon Wolfe, PA   ANESTHESIA:   spinal  EBL:  Total I/O In: 1550 [I.V.:1550] Out: 600 [Urine:150; Blood:450]  BLOOD ADMINISTERED:none  DRAINS: 2 medium hemovac   LOCAL MEDICATIONS USED:  NONE  SPECIMEN:  Source of Specimen:  right femoral head  DISPOSITION OF SPECIMEN:  PATHOLOGY  COUNTS:  YES  TOURNIQUET:  * No tourniquets in log *  DICTATION: .Dragon Dictation  PLAN OF CARE: Admit to inpatient   PATIENT DISPOSITION:  PACU - hemodynamically stable.   Delay start of Pharmacological VTE agent (>24hrs) due to surgical blood loss or risk of bleeding: yes

## 2015-02-14 NOTE — Evaluation (Signed)
Physical Therapy Evaluation Patient Details Name: Deborah Miller MRN: [id number] DOB: 30-Dec-1955 Today's Date: 02/14/2015   History of Present Illness  pt admitted s/p R posterior THA.   Clinical Impression  Upon evaluation, pt appears groggy and reports being in a significant amount of pain despite being premedicated prior to the session. Pt is motivated and cooperated with PT throughout the evaluation despite her pain. Pt is mod assist for bed mobility(supine<>sit); requiring increased time and vc/assistance for sequencing of hip/LE movement and also physical assistance for control of trunk/shoulders when coming into sitting position. Pt able to assist with transfer by pulling up on grab bars and controlling her LLE. Pt able to sit on EOB without BUE support; displays good sitting balance. Pt required min assist for sit<>stand; when standing pt maintained forward flexed trunk. When asked to stand up straight pt had to sit down due to pain.  Pt will continue to benefit from skilled acute PT services in order to increase tolerance for functional mobility and increase strength/ROM of RLE.     Follow Up Recommendations SNF    Equipment Recommendations       Recommendations for Other Services       Precautions / Restrictions Precautions Precautions: Posterior Hip Precaution Booklet Issued: No Precaution Comments: R posterior hip: pt educated on precautions; verbalized understanding and repeated back to PT Restrictions Weight Bearing Restrictions: Yes RLE Weight Bearing: Weight bearing as tolerated      Mobility  Bed Mobility Overal bed mobility: Needs Assistance Bed Mobility: Supine to Sit     Supine to sit: Mod assist     General bed mobility comments: increased assistance for sequencing of LEs and hips prior to attempting to sit up; vc for hand placement. Physical assistance needed for control of shoulders/trunk for completion of transfer into sitting.   Transfers    Equipment used: Rolling walker (2 wheeled) Transfers: Sit to/from Stand Sit to Stand: Min assist         General transfer comment: Pt was able to transfer sit<>stand with min assist; pt displays significant forward trunk lean when standing and when asked to stand straight up pt is unable to tolerate due to pain.   Ambulation/Gait Ambulation/Gait assistance:  (not attempted due to pain)              Stairs            Wheelchair Mobility    Modified Rankin (Stroke Patients Only)       Balance Overall balance assessment: No apparent balance deficits (not formally assessed)                                           Pertinent Vitals/Pain Pain Assessment: 0-10 Pain Score: 8  Pain Descriptors / Indicators: Burning Pain Intervention(s): Monitored during session;Repositioned;Premedicated before session    Home Living Family/patient expects to be discharged to:: Private residence Living Arrangements: Spouse/significant other   Type of Home: House Home Access: Level entry;Stairs to enter Entrance Stairs-Rails: Left Entrance Stairs-Number of Steps: 3 Home Layout: One level Home Equipment: Walker - 2 wheels;Cane - single point      Prior Function Level of Independence: Independent with assistive device(s)         Comments: pt was using SPC for ambulation in home and out in community prior to surgery     Hand Dominance  Extremity/Trunk Assessment   Upper Extremity Assessment: Overall WFL for tasks assessed           Lower Extremity Assessment: RLE deficits/detail RLE Deficits / Details: pain and limited ROM/strength compared to LLE; significant guarding of RLE was apparent. Hip precautions limiting certain positions.       Communication   Communication: No difficulties  Cognition Arousal/Alertness: Awake/alert Behavior During Therapy: WFL for tasks assessed/performed Overall Cognitive Status: Within Functional Limits  for tasks assessed                      General Comments      Exercises Total Joint Exercises Ankle Circles/Pumps: AROM;Both;15 reps Quad Sets: Strengthening;Both;15 reps Gluteal Sets: Strengthening;Both;15 reps Towel Squeeze: Strengthening;Both;15 reps      Assessment/Plan    PT Assessment Patient needs continued PT services  PT Diagnosis Difficulty walking;Acute pain;Abnormality of gait   PT Problem List Decreased strength;Decreased mobility;Decreased activity tolerance;Decreased range of motion;Pain  PT Treatment Interventions DME instruction;Gait training;Stair training;Functional mobility training;Therapeutic activities;Therapeutic exercise;Balance training;Patient/family education   PT Goals (Current goals can be found in the Care Plan section) Acute Rehab PT Goals Patient Stated Goal: to be in less pain PT Goal Formulation: With patient Time For Goal Achievement: 02/28/15 Potential to Achieve Goals: Good    Frequency BID   Barriers to discharge        Co-evaluation               End of Session Equipment Utilized During Treatment: Gait belt Activity Tolerance: Patient limited by pain Patient left: in bed;with bed alarm set;with nursing/sitter in room;with SCD's reapplied;with call bell/phone within reach           Time: 8119-1478 PT Time Calculation (min) (ACUTE ONLY): 27 min   Charges:         PT G CodesGeorgina Peer 02/14/2015, 4:38 PM

## 2015-02-14 NOTE — Transfer of Care (Signed)
Immediate Anesthesia Transfer of Care Note  Patient: Deborah HalimMaryann C Miller  Procedure(s) Performed: Procedure(s): TOTAL HIP ARTHROPLASTY (Right)  Patient Location: PACU  Anesthesia Type:Spinal  Level of Consciousness: awake, alert  and oriented  Airway & Oxygen Therapy: Patient Spontanous Breathing  Post-op Assessment: Report given to RN and Post -op Vital signs reviewed and stable  Post vital signs: Reviewed and stable  Last Vitals:  Filed Vitals:   02/14/15 1155  BP: 110/70  Pulse: 83  Temp: 36.6 C  Resp: 20    Complications: No apparent anesthesia complications

## 2015-02-15 ENCOUNTER — Encounter
Admission: RE | Admit: 2015-02-15 | Discharge: 2015-02-15 | Disposition: A | Discharge status: Home / Self Care | Payer: BC Managed Care – PPO | Source: Ambulatory Visit | Attending: Internal Medicine | Admitting: Internal Medicine

## 2015-02-15 DIAGNOSIS — E119 Type 2 diabetes mellitus without complications: Secondary | ICD-10-CM | POA: Insufficient documentation

## 2015-02-15 DIAGNOSIS — Z794 Long term (current) use of insulin: Secondary | ICD-10-CM | POA: Insufficient documentation

## 2015-02-15 LAB — BASIC METABOLIC PANEL
ANION GAP: 7 (ref 5–15)
BUN: 12 mg/dL (ref 6–20)
CALCIUM: 8.2 mg/dL — AB (ref 8.9–10.3)
CO2: 25 mmol/L (ref 22–32)
CREATININE: 0.6 mg/dL (ref 0.44–1.00)
Chloride: 103 mmol/L (ref 101–111)
GFR calc Af Amer: >60 mL/min (ref >60)
GLUCOSE: 123 mg/dL — AB (ref 65–99)
Potassium: 3.4 mmol/L — ABNORMAL LOW (ref 3.5–5.1)
Sodium: 135 mmol/L (ref 135–145)

## 2015-02-15 LAB — GLUCOSE, CAPILLARY: Glucose-Capillary: 135 mg/dL — ABNORMAL HIGH (ref 65–99)

## 2015-02-15 LAB — CBC
HEMATOCRIT: 32.4 % — AB (ref 35.0–47.0)
Hemoglobin: 11.2 g/dL — ABNORMAL LOW (ref 12.0–16.0)
MCH: 30.2 pg (ref 26.0–34.0)
MCHC: 34.6 g/dL (ref 32.0–36.0)
MCV: 87.1 fL (ref 80.0–100.0)
PLATELETS: 233 10*3/uL (ref 150–440)
RBC: 3.72 MIL/uL — ABNORMAL LOW (ref 3.80–5.20)
RDW: 13.9 % (ref 11.5–14.5)
WBC: 10.6 10*3/uL (ref 3.6–11.0)

## 2015-02-15 MED ORDER — TAMSULOSIN HCL 0.4 MG PO CAPS
0.4000 mg | ORAL_CAPSULE | Freq: Two times a day (BID) | ORAL | Status: DC
Start: 2015-02-15 — End: 2015-02-17
  Administered 2015-02-15 – 2015-02-17 (×5): 0.4 mg via ORAL
  Filled 2015-02-15 (×5): qty 1

## 2015-02-15 MED ORDER — POTASSIUM CHLORIDE 20 MEQ PO PACK
20.0000 meq | PACK | Freq: Three times a day (TID) | ORAL | Status: AC
  Administered 2015-02-15 (×3): 20 meq via ORAL
  Filled 2015-02-15 (×3): qty 1

## 2015-02-15 NOTE — Progress Notes (Addendum)
   Subjective: 1 Day Post-Op Procedure(s) (LRB): TOTAL HIP ARTHROPLASTY (Right) Patient reports pain as 5 on 0-10 scale.   Patient is well, and has had no acute complaints or problems We will start therapy today.  Plan is to go Rehab after hospital stay. no nausea and no vomiting Patient denies any chest pains or shortness of breath. Patient does have a history of sciatica which was aggravated yesterday evening but has resolved. Dyes any numbness, tingling or burning sensation on today's visit. Objective: Vital signs in last 24 hours: Temp:  [97.6 F (36.4 C)-99.4 F (37.4 C)] 98.2 F (36.8 C) (10/13 0312) Pulse Rate:  [83-106] 99 (10/13 0312) Resp:  [17-20] 18 (10/13 0312) BP: (106-136)/(56-83) 111/56 mmHg (10/13 0312) SpO2:  [94 %-100 %] 95 % (10/13 0312) well approximated incision Heels are non tender and elevated off the bed using rolled towels Intake/Output from previous day: 10/12 0701 - 10/13 0700 In: 3808 [P.O.:560; I.V.:2898; IV Piggyback:350] Out: 2920 [Urine:2150; Drains:320; Blood:450] Intake/Output this shift:     Recent Labs  02/14/15 0644  HGB 13.3    Recent Labs  02/14/15 0644  HCT 39.0    Recent Labs  02/14/15 0644  NA 139  K 4.4  GLUCOSE 122*   No results for input(s): LABPT, INR in the last 72 hours.  EXAM General - Patient is Alert, Appropriate and Oriented Extremity - Neurologically intact Neurovascular intact Sensation intact distally Intact pulses distally Dorsiflexion/Plantar flexion intact Dressing - dressing C/D/I Motor Function - intact, moving foot and toes well on exam.   Patient is having difficulty with urination. Has had some burning sensation. Bladder scan showed 170 cc remaining.  Past Medical History  Diagnosis Date  . Asthma 1957  . Hemorrhoids 2012  . Hypertension   . Breast screening, unspecified   . Acid reflux   . Fecal smearing   . Breast microcalcification, mammographic 2014  . Sleep apnea     cpap  .  Shortness of breath dyspnea     increased with anxiety  . Depression   . Anxiety   . History of hiatal hernia   . Headache   . Carpal tunnel syndrome   . Arthritis   . Diabetes mellitus without complication (HCC)   . Pain     chronic lbp,stenosis    Assessment/Plan: 1 Day Post-Op Procedure(s) (LRB): TOTAL HIP ARTHROPLASTY (Right) Active Problems:   S/P total hip arthroplasty  Estimated body mass index is 44.26 kg/(m^2) as calculated from the following:   Height as of this encounter: 4' 7.5" (1.41 m).   Weight as of this encounter: 87.998 kg (194 lb). Up with therapy D/C IV fluids Discharge to SNF on Saturday    Labs: Were reviewed DVT Prophylaxis - Lovenox, Foot Pumps and TED hose Weight-Bearing as tolerated to right leg Begin working on a bowel movement In and out 1 if needed We'll add Flomax D/C O2 and Pulse OX and try on Room [employer]  Jon R. [facility] LouisianaWolfe PA [facility] 02/15/2015, 7:50 AM    Addendum: Today's labs just back. K = 3.4 (Hypokalemia) Will supplement with KlorCon and recheck in AM.  Illene LabradorJames P. Angie FavaHooten, Jr. M.D.

## 2015-02-15 NOTE — Progress Notes (Signed)
Physical Therapy Treatment Patient Details Name: Deborah Miller MRN: [id number] DOB: 05/05/1956 Today's Date: 02/15/2015    History of Present Illness pt admitted s/p R posterior THA.     PT Comments    Pt requires Max A for rolling in bed for bedpan. Mod A continues for all other bed mobility primarily for trunk and initiation of R LE movement. Pt moves slowly, cautiously. Encouragement/reassurance for transfers/ ambulation with cueing required for safety and sequencing with all transfers and ambulation. Pt requires education on all three posterior hip precautions throughout treatment with all activity. Ambulation effortful/slow, but not balance issues. Pt received up in chair; will return this pm for exercises and continued ambulation as tolerated. OT present end of session.   Follow Up Recommendations  SNF     Equipment Recommendations       Recommendations for Other Services       Precautions / Restrictions Restrictions Weight Bearing Restrictions: Yes RLE Weight Bearing: Weight bearing as tolerated    Mobility  Bed Mobility Overal bed mobility: Needs Assistance Bed Mobility: Rolling;Supine to Sit Rolling: Max assist;+2 for physical assistance   Supine to sit: Mod assist     General bed mobility comments:  (Min A for LEs; Mod A for trunk)  Transfers Overall transfer level: Needs assistance Equipment used: Rolling walker (2 wheeled) Transfers: Sit to/from Stand Sit to Stand: Min assist         General transfer comment:  (cues for initiating hip extensors to stand upright)  Ambulation/Gait Ambulation/Gait assistance: Min guard (cues for sequence) Ambulation Distance (Feet): 4 Feet (initial 4 ft to bedside commode; later 23 ft) Assistive device: Rolling walker (2 wheeled) (Needs youth height rw) Gait Pattern/deviations: Step-to pattern;Decreased step length - left;Decreased stance time - right;Decreased weight shift to right;Antalgic;Trunk flexed Gait  velocity: reduced Gait velocity interpretation: <1.8 ft/sec, indicative of risk for recurrent falls General Gait Details: Requires sequence cues and cues for shorter R step initially to allow for at least step to pattern; improved with cues   Stairs            Wheelchair Mobility    Modified Rankin (Stroke Patients Only)       Balance Overall balance assessment: No apparent balance deficits (not formally assessed)                                  Cognition Arousal/Alertness: Awake/alert Behavior During Therapy: WFL for tasks assessed/performed Overall Cognitive Status: Within Functional Limits for tasks assessed       Memory: Decreased recall of precautions              Exercises Total Joint Exercises Ankle Circles/Pumps: AROM;Both (while trying to use the bathroom)    General Comments        Pertinent Vitals/Pain Pain Assessment: 0-10 Pain Score: 7  Pain Location: R hip/buttock Pain Descriptors / Indicators: Constant;Burning;Aching Pain Intervention(s): Limited activity within patient's tolerance;Monitored during session;Premedicated before session;Repositioned    Home Living                      Prior Function            PT Goals (current goals can now be found in the care plan section)      Frequency  BID    PT Plan Current plan remains appropriate    Co-evaluation  End of Session Equipment Utilized During Treatment: Gait belt Activity Tolerance: Patient limited by pain Patient left: in chair;with call bell/phone within reach (OT in room; chair pad in place)     Time: [id number] PT Time Calculation (min) (ACUTE ONLY): 42 min  Charges:  $Gait Training: 8-22 mins $Therapeutic Activity: 23-37 mins                    G Codes:      Kristeen MissHeidi Elizabeth Bishop 02/15/2015, 11:00 AM

## 2015-02-15 NOTE — Progress Notes (Signed)
Physical Therapy Treatment Patient Details Name: Hassell HalimMaryann C Muhlbauer MRN: [id number] DOB: 1956/04/01 Today's Date: 02/15/2015    History of Present Illness This patient is a 59 year old female who came [facility] for a R THR (posterior)    PT Comments    Pt notes just returning to bed shortly after lunch and receiving pain medication, so is extremely tired. Pt notes pain lessening, but still present (from 7 to 5/10). Pt refuses out of bed, but agreeable to bed exercises. Pt participates with assist required for isotonic exercises. Pt notes pain with movement. Nursing plans to give patient additional medication post treatment; nursing notified of session's end. Continue PT for progression of range of motion, endurance and strength, for improvement of all functional mobility..   Follow Up Recommendations  SNF     Equipment Recommendations       Recommendations for Other Services       Precautions / Restrictions Precautions Precautions: Posterior Hip Restrictions Weight Bearing Restrictions: Yes RLE Weight Bearing: Weight bearing as tolerated    Mobility  Bed Mobility Overal bed mobility: Needs Assistance Bed Mobility: Rolling;Supine to Sit Rolling: Max assist;+2 for physical assistance   Supine to sit: Mod assist     General bed mobility comments: Refused out of bed; pt just returned to bed post lunch and is too fatigued  Transfers Overall transfer level: Needs assistance Equipment used: Rolling walker (2 wheeled) Transfers: Sit to/from Stand Sit to Stand: Min assist         General transfer comment:  (cues for initiating hip extensors to stand upright)  Ambulation/Gait Ambulation/Gait assistance: Min guard (cues for sequence) Ambulation Distance (Feet): 4 Feet (initial 4 ft to bedside commode; later 23 ft) Assistive device: Rolling walker (2 wheeled) (Needs youth height rw) Gait Pattern/deviations: Step-to pattern;Decreased step length - left;Decreased stance time -  right;Decreased weight shift to right;Antalgic;Trunk flexed Gait velocity: reduced Gait velocity interpretation: <1.8 ft/sec, indicative of risk for recurrent falls General Gait Details: Requires sequence cues and cues for shorter R step initially to allow for at least step to pattern; improved with cues   Stairs            Wheelchair Mobility    Modified Rankin (Stroke Patients Only)       Balance Overall balance assessment: No apparent balance deficits (not formally assessed)                                  Cognition Arousal/Alertness: Lethargic Behavior During Therapy: WFL for tasks assessed/performed Overall Cognitive Status: Within Functional Limits for tasks assessed       Memory: Decreased recall of precautions (Names 1 of 3)              Exercises Total Joint Exercises Ankle Circles/Pumps: AROM;Both;20 reps;Supine Quad Sets: Strengthening;Both;Supine;15 reps Gluteal Sets: Strengthening;Both;15 reps;Supine Towel Squeeze: Strengthening;Both;15 reps;Supine Short Arc Quad: Right;AAROM;15 reps;Supine Heel Slides: AAROM;Right;15 reps;Supine Hip ABduction/ADduction: AAROM;Right;15 reps;Supine Straight Leg Raises: AAROM;Right;10 reps;Supine    General Comments        Pertinent Vitals/Pain Pain Assessment: 0-10 Pain Score: 5  Pain Location: R hip Pain Descriptors / Indicators: Constant;Burning;Aching Pain Intervention(s): Limited activity within patient's tolerance;Premedicated before session;Monitored during session    Home Living Family/patient expects to be discharged to:: Skilled nursing facility Living Arrangements: Spouse/significant other   Type of Home: House Home Access: Level entry;Stairs to enter Entrance Stairs-Rails: Left Home Layout: One level Home  Equipment: Dan Humphreys - 2 wheels;Cane - single point      Prior Function Level of Independence: Independent with assistive device(s)      Comments: used single point cane    PT Goals (current goals can now be found in the care plan section) Acute Rehab PT Goals Patient Stated Goal: to get some rehab Progress towards PT goals: Progressing toward goals    Frequency  BID    PT Plan Current plan remains appropriate    Co-evaluation             End of Session Equipment Utilized During Treatment: Gait belt Activity Tolerance: Patient limited by fatigue;Patient limited by pain Patient left: in bed;with call bell/phone within reach;with bed alarm set;with family/visitor present     Time: 1610-9604 PT Time Calculation (min) (ACUTE ONLY): 17 min  Charges:  $Gait Training: 8-22 mins $Therapeutic Exercise: 8-22 mins $Therapeutic Activity: 23-37 mins                    G Codes:      Kristeen Miss 02/15/2015, 1:35 PM

## 2015-02-15 NOTE — Clinical Social Work Note (Signed)
Clinical Social Work Assessment  Patient Details  Name: Deborah Miller MRN: [id number] Date of Birth: [birth year]  Date of referral:  02/15/15               Reason for consult:  Facility Placement                Permission sought to share information with:  Chartered certified accountant granted to share information::  Yes, Verbal Permission Granted  Name::      [place]::   [facility]   Relationship::     Contact Information:     Housing/Transportation Living arrangements for the past 2 months:  [place] of Information:  Patient, Spouse Patient Interpreter Needed:  None Criminal Activity/Legal Involvement Pertinent to Current Situation/Hospitalization:  No - Comment as needed Significant Relationships:  Adult Children, Spouse Lives with:  Spouse Do you feel safe going back to the place where you live?  Yes Need for family participation in patient care:  Yes (Comment)  Care giving concerns:  Patient lives in [place] with her husband Deborah Miller.    Social Worker assessment / plan:  Holiday representative (CSW) received SNF consult. PT is recommending SNF. CSW met with patient and her husband was at bedside. Patient was alert and oriented and sitting in the chair. CSW introduced self and explained role of CSW department. Patient reported that she wants to go to rehab and has spoken with Deborah Miller the admissions coordinator prior to surgery. CSW explained that a SNF search will be sent out and her insurance BCBS will have to approve SNF stay. Patient verbalized her understanding.   CSW presented bed offers to patient. She chose [facility]. Deborah Miller admissions coordinator at [facility] started [employer] authorization today. MD will have to complete D/C Summary Friday to be sent to [facility] before the weekend.   Employment status:  Disabled (Comment on whether or not currently receiving Disability), Retired Forensic scientist:  Managed Care PT  Recommendations:  [place] / Referral to community resources:  [place]  Patient/Family's Response to care:  Patient is agreeable to D/C to [facility] for rehab.   Patient/Family's Understanding of and Emotional Response to Diagnosis, Current Treatment, and Prognosis: Patient was pleasant throughout assessment and thanked CSW for visit.   Emotional Assessment Appearance:  Appears stated age Attitude/Demeanor/Rapport:    Affect (typically observed):  Accepting, Adaptable, Pleasant Orientation:  Oriented to Self, Oriented to Place, Oriented to  Time, Oriented to Situation Alcohol / Substance use:  Not Applicable Psych involvement (Current and /or in the community):  No (Comment)  Discharge Needs  Concerns to be addressed:  Discharge Planning Concerns Readmission within the last 30 days:  No Current discharge risk:  Dependent with Mobility Barriers to Discharge:  Continued Medical Work up   Loralyn Freshwater, LCSW 02/15/2015, 11:27 AM

## 2015-02-15 NOTE — Clinical Social Work Placement (Signed)
   CLINICAL SOCIAL WORK PLACEMENT  NOTE  Date:  02/15/2015  Patient Details  Name: Deborah Miller MRN: [id number] Date of Birth: 05/05/1956  Clinical Social Work is seeking post-discharge placement for this patient at the Skilled  Nursing Facility level of care (*CSW will initial, date and re-position this form in  chart as items are completed):  Yes   Patient/family provided with [place] Clinical Social Work Department's list of facilities offering this level of care within the geographic area requested by the patient (or if unable, by the patient's family).  Yes   Patient/family informed of their freedom to choose among providers that offer the needed level of care, that participate in Medicare, Medicaid or managed care program needed by the patient, have an available bed and are willing to accept the patient.  Yes   Patient/family informed of [facility]'s ownership interest in [facility] and [facility], as well as of the fact that they are under no obligation to receive care at these facilities.  PASRR submitted to EDS on 02/15/15     PASRR number received on 02/15/15     Existing PASRR number confirmed on       FL2 transmitted to all facilities in geographic area requested by pt/family on 02/15/15     FL2 transmitted to all facilities within larger geographic area on       Patient informed that his/her managed care company has contracts with or will negotiate with certain facilities, including the following:        Yes   Patient/family informed of bed offers received.  Patient chooses bed at  [facility] )     Physician recommends and patient chooses bed at      Patient to be transferred to   on  .  Patient to be transferred to facility by       Patient family notified on   of transfer.  Name of family member notified:        PHYSICIAN Please sign FL2     Additional Comment:    _______________________________________________ Haig ProphetMorgan, Halimah Bewick G,  LCSW 02/15/2015, 11:26 AM

## 2015-02-15 NOTE — Care Management (Signed)
PT has recommended SNF. CSW following.

## 2015-02-15 NOTE — Evaluation (Signed)
Occupational Therapy Evaluation Patient Details Name: Deborah Miller MRN: [id number] DOB: [birth year] Today's Date: 02/15/2015    History of Present Illness This patient is a 59 year old female who came to [facility] for a R THR (posterior)   Clinical Impression   This patient is a 59 year old female who came to [facility] for a R total hip replacement (anterior approach) .  Patient lives in a  home with her husband.   She had been independent with ADL and functional mobility. She now requires  assistance and would benefit from Occupational Therapy for ADL/functional mobility training while .staying within hip precautions (posterior approach) .      Follow Up Recommendations       Equipment Recommendations       Recommendations for Other Services       Precautions / Restrictions Precautions Precautions: Posterior Hip Restrictions Weight Bearing Restrictions: Yes RLE Weight Bearing: Weight bearing as tolerated      Mobility Bed Mobility   Transfers             Balance                                          ADL                                         General ADL Comments: Patient had been independent. Practiced lower body dressing techniques using hip kit to stay within hip precautions (posterior approach). Patient Donned/doffed socks and pants to knees (draub still in place). Needed set up and minimal assist and verbal cues for technique and safety.     Vision     Perception     Praxis      Pertinent Vitals/Pain Pain Assessment: 0-10 Pain Score: 7  Pain Location: R hip/buttock Pain Descriptors / Indicators: Constant;Burning;Aching Pain Intervention(s): Limited activity within patient's tolerance;Monitored during session;Premedicated before session;Repositioned     Hand Dominance     Extremity/Trunk Assessment Upper Extremity Assessment Upper Extremity Assessment: Overall WFL for tasks  assessed   Lower Extremity Assessment Lower Extremity Assessment: Defer to PT evaluation       Communication Communication Communication: No difficulties   Cognition Arousal/Alertness: Awake/alert Behavior During Therapy: WFL for tasks assessed/performed Overall Cognitive Status: Within Functional Limits for tasks assessed       Memory: Decreased recall of precautions (Names 1 of 3)             General Comments       Exercises       Shoulder Instructions      Home Living Family/patient expects to be discharged to:: Skilled nursing facility Living Arrangements: Spouse/significant other   Type of Home: House Home Access: Level entry;Stairs to enter Entrance Stairs-Number of Steps: 3 Entrance Stairs-Rails: Left Home Layout: One level Alternate Level Stairs-Number of Steps: 10    Bathroom Shower/Tub: Tub/shower unit         Home Equipment: Environmental consultant - 2 wheels;Cane - single point          Prior Functioning/Environment Level of Independence: Independent with assistive device(s)        Comments: used single point cane    OT Diagnosis: Acute pain   OT Problem List:     OT Treatment/Interventions:  Self-care/ADL training    OT Goals(Current goals can be found in the care plan section) Acute Rehab OT Goals Patient Stated Goal: to get some rehab OT Goal Formulation: With patient Time For Goal Achievement: 03/01/15 Potential to Achieve Goals: Good  OT Frequency: Min 1X/week   Barriers to D/C:            Co-evaluation              End of Session Equipment Utilized During Treatment:  (hip kit)  Activity Tolerance:   Patient left: in chair;with call bell/phone within reach;with chair alarm set   Time: 1050-1111 OT Time Calculation (min): 21 min Charges:  OT General Charges $OT Visit: 1 Procedure OT Evaluation $Initial OT Evaluation Tier I: 1 Procedure OT Treatments $Self Care/Home Management : 8-22 mins G-Codes:    Myrene Galas, MS/OTR/L  02/15/2015, 11:26 AM

## 2015-02-16 LAB — URINE CULTURE: Culture: NO GROWTH

## 2015-02-16 LAB — SURGICAL PATHOLOGY

## 2015-02-16 LAB — BASIC METABOLIC PANEL WITH GFR
Anion gap: 6 (ref 5–15)
BUN: 11 mg/dL (ref 6–20)
CO2: 26 mmol/L (ref 22–32)
Calcium: 8.6 mg/dL — ABNORMAL LOW (ref 8.9–10.3)
Chloride: 101 mmol/L (ref 101–111)
Creatinine, Ser: 0.64 mg/dL (ref 0.44–1.00)
GFR calc Af Amer: >60 mL/min
GFR calc non Af Amer: >60 mL/min
Glucose, Bld: 193 mg/dL — ABNORMAL HIGH (ref 65–99)
Potassium: 3.8 mmol/L (ref 3.5–5.1)
Sodium: 133 mmol/L — ABNORMAL LOW (ref 135–145)

## 2015-02-16 LAB — CBC
HCT: 34.5 % — ABNORMAL LOW (ref 35.0–47.0)
Hemoglobin: 11.4 g/dL — ABNORMAL LOW (ref 12.0–16.0)
MCH: 28.8 pg (ref 26.0–34.0)
MCHC: 33 g/dL (ref 32.0–36.0)
MCV: 87.4 fL (ref 80.0–100.0)
PLATELETS: 219 10*3/uL (ref 150–440)
RBC: 3.95 MIL/uL (ref 3.80–5.20)
RDW: 13.4 % (ref 11.5–14.5)
WBC: 11 10*3/uL (ref 3.6–11.0)

## 2015-02-16 MED ORDER — ENOXAPARIN SODIUM 30 MG/0.3ML ~~LOC~~ SOLN: 30.0000 mg | Freq: Two times a day (BID) | SUBCUTANEOUS | Status: DC

## 2015-02-16 MED ORDER — LACTULOSE 10 GM/15ML PO SOLN: 10.0000 g | Freq: Two times a day (BID) | ORAL | Status: DC | PRN

## 2015-02-16 MED ORDER — TRAMADOL HCL 50 MG PO TABS: 50.0000 mg | ORAL_TABLET | ORAL | Status: DC | PRN

## 2015-02-16 MED ORDER — OXYCODONE HCL 5 MG PO TABS: 5.0000 mg | ORAL_TABLET | ORAL | Status: DC | PRN

## 2015-02-16 NOTE — Progress Notes (Signed)
Physical Therapy Treatment Patient Details Name: Deborah Miller MRN: [id number] DOB: 10-01-1955 Today's Date: 02/16/2015    History of Present Illness This patient is a 59 year old female who came to [facility] for a R THR (posterior)    PT Comments    Pt continues to have pain and fatigue with the effort of PT, but continues to show increased mobility, gait and strength.  She is unable to do a SLR, but is increasing hip flexion strength and generally participating better.   Follow Up Recommendations  SNF     Equipment Recommendations   (youth walker)    Recommendations for Other Services       Precautions / Restrictions Precautions Precautions: Posterior Hip;Fall Precaution Comments: able to verbalize understanding of hip precautions. Restrictions Weight Bearing Restrictions: Yes RLE Weight Bearing: Weight bearing as tolerated    Mobility  Bed Mobility               General bed mobility comments: NT, pt in recliner on arrival and returns to recliner after ambualtion  Transfers Overall transfer level: Needs assistance Equipment used: Rolling walker (2 wheeled) Transfers: Sit to/from Stand Sit to Stand: Min guard (vcs for walker use and hand placement)         General transfer comment: pt able to rise without needing direct physical assist, but does need cuing for hand placement and set up  Ambulation/Gait Ambulation/Gait assistance: Min guard Ambulation Distance (Feet): 55 Feet Assistive device: Rolling walker (2 wheeled)       General Gait Details: Pt able to improve gait this afternoon with step-through and though she is still slow and pain limited she was able to walk further and with increased confidence. Pt's HR increases to 130s and O2 down to 92% with the effort, pt needs 2 standing rest breaks.    Stairs            Wheelchair Mobility    Modified Rankin (Stroke Patients Only)       Balance                                     Cognition Arousal/Alertness: Awake/alert Behavior During Therapy: WFL for tasks assessed/performed Overall Cognitive Status: Within Functional Limits for tasks assessed       Memory: Decreased recall of precautions              Exercises Total Joint Exercises Ankle Circles/Pumps: AROM;Both;20 reps;Supine Hip ABduction/ADduction: 15 reps;AROM;Strengthening Long Arc Quad: AROM;15 reps Marching in Standing: Seated;15 reps;AROM    General Comments        Pertinent Vitals/Pain Pain Assessment: 0-10 Pain Score: 4  Pain Location: right hip Pain Descriptors / Indicators: Aching;Burning Pain Intervention(s): Limited activity within patient's tolerance;Monitored during session    Home Living                      Prior Function            PT Goals (current goals can now be found in the care plan section) Acute Rehab PT Goals Patient Stated Goal: be able to take care of myself Progress towards PT goals: Progressing toward goals    Frequency  BID    PT Plan Current plan remains appropriate    Co-evaluation             End of Session Equipment Utilized During Treatment: Gait belt Activity  Tolerance: Patient limited by fatigue;Patient limited by pain Patient left: with chair alarm set     Time: [id number] PT Time Calculation (min) (ACUTE ONLY): 27 min  Charges:  $Gait Training: 8-22 mins $Therapeutic Exercise: 8-22 mins                    G Codes:     Deborah Miller, PT, DPT [phone]  Deborah Miller 02/16/2015, 4:57 PM

## 2015-02-16 NOTE — Discharge Summary (Signed)
Physician Discharge Summary  Patient ID: MARTENA EMANUELE MRN: [id number] DOB/AGE: [birth year] 59 y.o.  Admit date: 02/14/2015 Discharge date: 02/17/2015  Admission Diagnoses:  OSTEOARTHRITIS   Discharge Diagnoses: Patient Active Problem List   Diagnosis Date Noted  . S/P total hip arthroplasty 02/14/2015  . Esophageal reflux 03/21/2014  . Other screening mammogram 05/26/2013  . Breast microcalcification, mammographic 10/28/2012  . Superficial skin lesion 10/28/2012    Past Medical History  Diagnosis Date  . Asthma 1957  . Hemorrhoids 2012  . Hypertension   . Breast screening, unspecified   . Acid reflux   . Fecal smearing   . Breast microcalcification, mammographic 2014  . Sleep apnea     cpap  . Shortness of breath dyspnea     increased with anxiety  . Depression   . Anxiety   . History of hiatal hernia   . Headache   . Carpal tunnel syndrome   . Arthritis   . Diabetes mellitus without complication (HCC)   . Pain     chronic lbp,stenosis     Transfusion: No transfusions given on this admission   Consultants (if any):   case management for placement  Discharged Condition: Improved  Hospital Course: MYEASHA BALLOWE is an 59 y.o. female who was admitted 02/14/2015 with a diagnosis of severe degenerative arthrosis right hip and went to the operating room on 02/14/2015 and underwent the above named procedures.    Surgeries:Procedure(s): TOTAL HIP ARTHROPLASTY on 02/14/2015  PRE-OPERATIVE DIAGNOSIS: Degenerative arthrosis of the right hip, primary  POST-OPERATIVE DIAGNOSIS: Same  PROCEDURE: Right total hip arthroplasty  SURGEON: Jena Gauss. M.D.  ASSISTANT: Van Clines, PA (present and scrubbed throughout the case, critical for assistance with exposure, retraction, instrumentation, and closure)  ANESTHESIA: spinal  ESTIMATED BLOOD LOSS: 450 mL  FLUIDS REPLACED: 1500 mL of crystalloid  DRAINS: 2 medium drains to a Hemovac  reservoir  IMPLANTS UTILIZED: DePuy 10.5 mm small stature AML femoral stem, 48 mm OD [employer] acetabular component, +4 mm 10 [employer] polyethylene insert, and a 32 mm CoCr +1 mm hip ball  INDICATIONS FOR SURGERY: MEILANI EDMUNDSON is a 59 y.o. year old female with a long history of progressive hip and groin pain. X-rays demonstrated severe degenerative changes. The patient had not seen any significant improvement despite conservative nonsurgical intervention. After discussion of the risks and benefits of surgical intervention, the patient expressed understanding of the risks benefits and agree with plans for total hip arthroplasty.  Patient tolerated the surgery well. No complications .Patient was taken to PACU where she was stabilized and then transferred to the orthopedic floor.  Patient started on Lovenox 30 mg q 12 hrs. Foot pumps applied bilaterally at 80 mm hg. Heels elevated off bed with rolled towels. No evidence of DVT. Calves non tender. Negative Homan. Physical therapy started on day #1 for gait training and transfer with OT starting on  day #1 for ADL and assisted devices. Patient has been very slow with therapy. Ambulated 25  feet upon being discharged.  Patient's IV , foley were discontinued on day #1 and hemovac was d/c on day #2.   She was given perioperative antibiotics:  Anti-infectives    Start     Dose/Rate Route Frequency Ordered Stop   02/14/15 1333  valACYclovir (VALTREX) tablet 1,000 mg     1,000 mg Oral Every 12 hours PRN 02/14/15 1324     02/14/15 1330  clindamycin (CLEOCIN) IVPB 600 mg  600 mg 100 mL/hr over 30 Minutes Intravenous Every 6 hours 02/14/15 1324 02/15/15 0841   02/14/15 0554  clindamycin (CLEOCIN) 900 MG/50ML IVPB    Comments:  Letta Pate: cabinet override      02/14/15 0554 02/14/15 0805   02/14/15 0257  clindamycin (CLEOCIN) IVPB 900 mg  Status:  Discontinued     900 mg 100 mL/hr over 30 Minutes Intravenous On call  to O.R. 02/14/15 0257 02/14/15 1322    .  She was fitted with AV 1 compression foot pump devices, early ambulation, and TED stockings and Lovenox for DVT prophylaxis. Patient also instructed on ankle pumps  She benefited maximally from the hospital stay and there were no complications.    Recent vital signs:  Filed Vitals:   02/16/15 0727  BP: 102/57  Pulse: 96  Temp: 98.9 F (37.2 C)  Resp: 14    Recent laboratory studies:  Lab Results  Component Value Date   HGB 11.2* 02/15/2015   HGB 13.3 02/14/2015   HGB 13.1 01/24/2015   Lab Results  Component Value Date   WBC 10.6 02/15/2015   PLT 233 02/15/2015   Lab Results  Component Value Date   INR 0.96 01/24/2015   Lab Results  Component Value Date   NA 135 02/15/2015   K 3.4* 02/15/2015   CL 103 02/15/2015   CO2 25 02/15/2015   BUN 12 02/15/2015   CREATININE 0.60 02/15/2015   GLUCOSE 123* 02/15/2015    Discharge Medications:     Medication List    TAKE these medications        ADVAIR HFA 115-21 MCG/ACT inhaler  Generic drug:  fluticasone-salmeterol  Inhale 1-2 puffs into the lungs daily.     albuterol 108 (90 BASE) MCG/ACT inhaler  Commonly known as:  PROVENTIL HFA;VENTOLIN HFA  Inhale 2 puffs into the lungs 3 (three) times daily as needed for wheezing or shortness of breath.     ALPRAZolam 0.25 MG tablet  Commonly known as:  XANAX  Take 1 tablet by mouth daily.     CALCIUM 600 + D PO  Take 1 tablet by mouth daily.     calcium carbonate 500 MG chewable tablet  Commonly known as:  TUMS - dosed in mg elemental calcium  Chew 1 tablet by mouth 2 (two) times daily as needed for indigestion or heartburn.     cholestyramine light 4 GM/DOSE powder  Commonly known as:  PREVALITE  Take 1 g by mouth daily as needed.     docusate sodium 100 MG capsule  Commonly known as:  COLACE  Take 100 mg by mouth daily as needed for mild constipation.     enoxaparin 30 MG/0.3ML injection  Commonly known as:  LOVENOX   Inject 0.3 mLs (30 mg total) into the skin every 12 (twelve) hours.     EPIPEN 2-PAK 0.3 mg/0.3 mL Soaj injection  Generic drug:  EPINEPHrine  Inject into the muscle once. AS NEEDED     gabapentin 300 MG capsule  Commonly known as:  NEURONTIN  Take 2 capsules by mouth 2 (two) times daily.     L-Lysine 500 MG Caps  Take 2 capsules by mouth daily.     loratadine-pseudoephedrine 5-120 MG tablet  Commonly known as:  CLARITIN-D 12-hour  Take 1 tablet by mouth daily.     meloxicam 7.5 MG tablet  Commonly known as:  MOBIC  Take 1 tablet by mouth 2 (two) times daily.     multivitamin  tablet  Take 1 tablet by mouth daily.     omeprazole 20 MG capsule  Commonly known as:  PRILOSEC  Take 20 mg by mouth daily.     oxyCODONE 5 MG immediate release tablet  Commonly known as:  Oxy IR/ROXICODONE  Take 1-2 tablets (5-10 mg total) by mouth every 4 (four) hours as needed for severe pain.     potassium chloride 10 MEQ tablet  Commonly known as:  K-DUR  Take 10 mEq by mouth daily.     sodium chloride 0.65 % Soln nasal spray  Commonly known as:  OCEAN  Place 1 spray into both nostrils as needed for congestion.     SPIRIVA HANDIHALER 18 MCG inhalation capsule  Generic drug:  tiotropium  Place 1 capsule into inhaler and inhale daily.     tiZANidine 4 MG tablet  Commonly known as:  ZANAFLEX  Take 4 mg by mouth at bedtime as needed for muscle spasms.     torsemide 20 MG tablet  Commonly known as:  DEMADEX  Take 1 tablet by mouth daily.     traMADol 50 MG tablet  Commonly known as:  ULTRAM  Take 1-2 tablets (50-100 mg total) by mouth every 4 (four) hours as needed for moderate pain.     valACYclovir 1000 MG tablet  Commonly known as:  VALTREX  Take 1 tablet by mouth 2 (two) times daily as needed.        Diagnostic Studies: Dg Hip Port Unilat With Pelvis 1v Right  02/14/2015  CLINICAL DATA:  Hip replacement. EXAM: DG HIP (WITH OR WITHOUT PELVIS) 1V PORT RIGHT COMPARISON:  None.  FINDINGS: Total right hip replacement with good anatomic alignment. Hardware intact. No acute bony abnormality . IMPRESSION: Total right hip replacement good anatomic alignment. Hardware intact . Electronically Signed   By: Maisie Fushomas  Register   On: 02/14/2015 13:27    Disposition: Final discharge disposition not confirmed      Discharge Instructions    Diet - low sodium heart healthy    Complete by:  As directed      Increase activity slowly    Complete by:  As directed            Follow-up Information    Follow up with Donato HeinzHOOTEN,JAMES P, MD On 03/27/2015.   Specialty:  Orthopedic Surgery   Why:  at 315pm   Contact information:   [address] [facility] [place] [address] [phone]        Signed: Tera PartridgeWOLFE,Lendora Keys R. 02/16/2015, 7:42 AM

## 2015-02-16 NOTE — Discharge Instructions (Signed)

## 2015-02-16 NOTE — Progress Notes (Signed)
Plan is for patient to [facility] tomorrow 02/17/15. Per Kim admissions coordinator [facility] is going to room 219-A. RN will call report at [phone]. Per Hermina BartersJanet [employer] Case Manager patient is approved for SNF and can admit [facility]. Clinical Child psychotherapistocial Worker (CSW) sent [facility] Summary to [facility] via [facility] today. [facility]. Patient and husband are aware of above. CSW will continue to follow and assist as needed.  Jetta LoutBailey Morgan, LCSWA [phone]

## 2015-02-16 NOTE — Progress Notes (Signed)
Physical Therapy Treatment Patient Details Name: Deborah Miller MRN: [id number] DOB: December 29, 1955 Today's Date: 02/16/2015    History of Present Illness This patient is a 59 year old female who came to [facility] for a R THR (posterior)    PT Comments    Pt shows good effort with PT and is eager to do all she can, but she remains weak and pain limited.  She has a hard time clearing foot during ambulation and has very slow, labored gait.  She is able to do bed mobility and rise to standing with less assist today but continues to be limited.   Follow Up Recommendations  SNF     Equipment Recommendations       Recommendations for Other Services       Precautions / Restrictions Precautions Precautions: Posterior Hip Restrictions RLE Weight Bearing: Weight bearing as tolerated    Mobility  Bed Mobility Overal bed mobility: Needs Assistance Bed Mobility: Supine to Sit     Supine to sit: Min assist     General bed mobility comments: Pt with heavy use of the rails and needs much extra time, but she shows good effort and tries hard with getting to EOB   Transfers Overall transfer level: Needs assistance Equipment used: Rolling walker (2 wheeled) Transfers: Sit to/from Stand Sit to Stand: Min assist         General transfer comment: Pt with short legs making getting to standing from bed relatively easy for her   Ambulation/Gait Ambulation/Gait assistance: Min guard Ambulation Distance (Feet): 40 Feet Assistive device: Rolling walker (2 wheeled)       General Gait Details: Pt with slow step-to, heavily reliant on the walker and she has a hard time clearing her toes fully with R swing.    Stairs            Wheelchair Mobility    Modified Rankin (Stroke Patients Only)       Balance                                    Cognition Arousal/Alertness: Awake/alert Behavior During Therapy: WFL for tasks assessed/performed Overall Cognitive Status:  Within Functional Limits for tasks assessed       Memory: Decreased recall of precautions              Exercises Total Joint Exercises Ankle Circles/Pumps: AROM;Both;20 reps;Supine Quad Sets: Strengthening;15 reps Gluteal Sets: Strengthening;15 reps Heel Slides: AAROM;10 reps Hip ABduction/ADduction: AROM;10 reps Straight Leg Raises: PROM;AAROM;10 reps    General Comments        Pertinent Vitals/Pain Pain Score: 6  Pain Location: R hip    Home Living                      Prior Function            PT Goals (current goals can now be found in the care plan section) Progress towards PT goals: Progressing toward goals    Frequency  BID    PT Plan Current plan remains appropriate    Co-evaluation             End of Session Equipment Utilized During Treatment: Gait belt Activity Tolerance: Patient limited by fatigue;Patient limited by pain Patient left: in bed;with call bell/phone within reach;with bed alarm set;with family/visitor present     Time: 9562-1308 PT Time Calculation (min) (ACUTE ONLY):  25 min  Charges:  $Gait Training: 8-22 mins $Therapeutic Exercise: 8-22 mins                    G Codes:     Tashonna Descoteaux, PT, DPT (Loran [phone]  Malachi ProGalen R Garnetta Fedrick 02/16/2015, 10:41 AM

## 2015-02-16 NOTE — Progress Notes (Signed)
Clinical Child psychotherapistocial Worker (CSW) faxed post op day 2 PT note [place] today. CSW left a voicemail with Hermina BartersJanet [employer] case Production designer, theatre/television/filmmanager. CSW will continue to follow and assist as needed.   Jetta LoutBailey Morgan, LCSWA [phone]

## 2015-02-16 NOTE — Progress Notes (Signed)
   Subjective: 2 Days Post-Op Procedure(s) (LRB): TOTAL HIP ARTHROPLASTY (Right) Patient reports pain as 5 on 0-10 scale.   Patient is well, and has had no acute complaints or problems Continue with physical therapy today.  Plan is to go Rehab after hospital stay. no nausea and no vomiting Patient denies any chest pains or shortness of breath. Objective: Vital signs in last 24 hours: Temp:  [98.2 F (36.8 C)-99.1 F (37.3 C)] 98.2 F (36.8 C) (10/14 0506) Pulse Rate:  [88-126] 107 (10/14 0506) Resp:  [18-20] 19 (10/14 0506) BP: (108-121)/(57-69) 121/62 mmHg (10/14 0506) SpO2:  [95 %-100 %] 100 % (10/14 0506) well approximated incision Heels are non tender and elevated off the bed using rolled towels Intake/Output from previous day: 10/13 0701 - 10/14 0700 In: 1026.7 [P.O.:720; I.V.:246.7] Out: 1730 [Urine:1600; Drains:130] Intake/Output this shift:     Recent Labs  02/14/15 0644 02/15/15 0718  HGB 13.3 11.2*    Recent Labs  02/14/15 0644 02/15/15 0718  WBC  --  10.6  RBC  --  3.72*  HCT 39.0 32.4*  PLT  --  233    Recent Labs  02/14/15 0644 02/15/15 0718  NA 139 135  K 4.4 3.4*  CL  --  103  CO2  --  25  BUN  --  12  CREATININE  --  0.60  GLUCOSE 122* 123*  CALCIUM  --  8.2*   No results for input(s): LABPT, INR in the last 72 hours.  EXAM General - Patient is Alert, Appropriate and Oriented Extremity - Neurologically intact Neurovascular intact Sensation intact distally Intact pulses distally Dorsiflexion/Plantar flexion intact Dressing - dressing C/D/I Motor Function - intact, moving foot and toes well on exam.    Past Medical History  Diagnosis Date  . Asthma 1957  . Hemorrhoids 2012  . Hypertension   . Breast screening, unspecified   . Acid reflux   . Fecal smearing   . Breast microcalcification, mammographic 2014  . Sleep apnea     cpap  . Shortness of breath dyspnea     increased with anxiety  . Depression   . Anxiety   .  History of hiatal hernia   . Headache   . Carpal tunnel syndrome   . Arthritis   . Diabetes mellitus without complication (HCC)   . Pain     chronic lbp,stenosis    Assessment/Plan: 2 Days Post-Op Procedure(s) (LRB): TOTAL HIP ARTHROPLASTY (Right) Active Problems:   S/P total hip arthroplasty  Estimated body mass index is 44.26 kg/(m^2) as calculated from the following:   Height as of this encounter: 4' 7.5" (1.41 m).   Weight as of this encounter: 87.998 kg (194 lb). Up with therapy Plan for discharge tomorrow Discharge to SNF  Labs: Pending DVT Prophylaxis - Lovenox, Foot Pumps and TED hose Weight-Bearing as tolerated to right leg Patient needs to have a bowel movement today Hemovac was discontinued on today's visit    Deborah Miller R. [facility] PA [facility] 02/16/2015, 7:27 AM

## 2015-02-16 NOTE — Progress Notes (Signed)
Patient alert and oriented, vitals stable. Pain controlled. Dressing dry and intact on right hip. Husband at bedside. Patient up in chair, tolerating well. [facility], still needs post op BM. Continue to monitor.

## 2015-02-16 NOTE — Progress Notes (Signed)
Occupational Therapy Treatment Patient Details Name: Deborah Miller MRN: [id number] DOB: March 05, 1956 Today's Date: 02/16/2015    History of present illness This patient is a 59 year old female who came to [facility] for a R THR (posterior)   OT comments  Patient seen this date for OT tx session with focus on self care tasks following total hip replacement and use of adaptive equipment.  Patient performed sit to stand with min assist to min guard and ambulated to the bathroom with min guard and RW.  Toilet transfer and hygiene with minimal assist.  Patient performed LB dressing with underwear and socks using reacher and sockaid with setup, min assist and cues.   Patient continues to progress well with performance of self care tasks following posterior hip precautions.  Overall min assist to complete tasks and use of adaptive equipment.  Cues for hip precautions with dressing tasks. Patient continues to benefit from skilled OT to increase independence in self care tasks and functional mobility/transfers.    Follow Up Recommendations  SNF    Equipment Recommendations       Recommendations for Other Services      Precautions / Restrictions Precautions Precautions: Posterior Hip;Fall Precaution Comments: able to verbalize understanding of hip precautions. Restrictions Weight Bearing Restrictions: Yes RLE Weight Bearing: Weight bearing as tolerated       Mobility Bed Mobility                  Transfers Overall transfer level: Needs assistance Equipment used: Rolling walker (2 wheeled) Transfers: Sit to/from Stand Sit to Stand: Min assist              Balance                                   ADL Overall ADL's : Needs assistance/impaired     Grooming: Standing;Min guard               Lower Body Dressing: Minimal assistance;Adhering to hip precautions;Sit to/from stand;With adaptive equipment   Toilet Transfer: Minimal assistance;Grab bars    Toileting- Clothing Manipulation and Hygiene: Min guard;Adhering to hip precautions       Functional mobility during ADLs: Min guard        Vision                     Perception     Praxis      Cognition   Behavior During Therapy: [facility] for tasks assessed/performed Overall Cognitive Status: Within Functional Limits for tasks assessed                       Extremity/Trunk Assessment               Exercises     Shoulder Instructions       General Comments      Pertinent Vitals/ Pain       Pain Assessment: 0-10 Pain Score: 3  Pain Location: right hip Pain Descriptors / Indicators: Aching;Burning Pain Intervention(s): Limited activity within patient's tolerance;Monitored during session  Home Living                                          Prior Functioning/Environment  Frequency Min 1X/week     Progress Toward Goals  OT Goals(current goals can now be found in the care plan section)  Progress towards OT goals: Progressing toward goals  Acute Rehab OT Goals Patient Stated Goal: be able to take care of myself OT Goal Formulation: With patient Time For Goal Achievement: 03/01/15 Potential to Achieve Goals: Good  Plan Discharge plan remains appropriate    Co-evaluation                 End of Session Equipment Utilized During Treatment: Gait belt;Rolling walker   Activity Tolerance Patient tolerated treatment well   Patient Left in chair;with call bell/phone within reach;with chair alarm set   Nurse Communication          Time: [id number] OT Time Calculation (min): 38 min  Charges: OT General Charges $OT Visit: 1 Procedure OT Treatments $Self Care/Home Management : 38-52 mins Roshana Shuffield T Minal Stuller, OTR/L, CLT  Emmalene Kattner 02/16/2015, 2:37 PM

## 2015-02-17 NOTE — Progress Notes (Signed)
   Subjective: 3 Days Post-Op Procedure(s) (LRB): TOTAL HIP ARTHROPLASTY (Right) Patient reports pain as mild.   Patient is well, and has had no acute complaints or problems We will start therapy today.  Plan is to go Rehab after hospital stay.  Objective: Vital signs in last 24 hours: Temp:  [98.2 F (36.8 C)-100.3 F (37.9 C)] 98.2 F (36.8 C) (10/15 0552) Pulse Rate:  [100-123] 100 (10/15 0552) Resp:  [16-19] 17 (10/15 0552) BP: (120-129)/(69-77) 120/70 mmHg (10/15 0552) SpO2:  [94 %-100 %] 97 % (10/15 0552)  Intake/Output from previous day: 10/14 0701 - 10/15 0700 In: 480 [P.O.:480] Out: 650 [Urine:650] Intake/Output this shift:     Recent Labs  02/15/15 0718 02/16/15 0821  HGB 11.2* 11.4*    Recent Labs  02/15/15 0718 02/16/15 0821  WBC 10.6 11.0  RBC 3.72* 3.95  HCT 32.4* 34.5*  PLT 233 219    Recent Labs  02/15/15 0718 02/16/15 0821  NA 135 133*  K 3.4* 3.8  CL 103 101  CO2 25 26  BUN 12 11  CREATININE 0.60 0.64  GLUCOSE 123* 193*  CALCIUM 8.2* 8.6*   No results for input(s): LABPT, INR in the last 72 hours.  EXAM General - Patient is Alert, Appropriate and Oriented Extremity - Neurovascular intact Sensation intact distally Intact pulses distally Dorsiflexion/Plantar flexion intact Dressing - dressing C/D/I and no drainage Motor Function - intact, moving foot and toes well on exam.   Past Medical History  Diagnosis Date  . Asthma 1957  . Hemorrhoids 2012  . Hypertension   . Breast screening, unspecified   . Acid reflux   . Fecal smearing   . Breast microcalcification, mammographic 2014  . Sleep apnea     cpap  . Shortness of breath dyspnea     increased with anxiety  . Depression   . Anxiety   . History of hiatal hernia   . Headache   . Carpal tunnel syndrome   . Arthritis   . Diabetes mellitus without complication (HCC)   . Pain     chronic lbp,stenosis    Assessment/Plan:   3 Days Post-Op Procedure(s) (LRB): TOTAL  HIP ARTHROPLASTY (Right) Active Problems:   S/P total hip arthroplasty  Estimated body mass index is 44.26 kg/(m^2) as calculated from the following:   Height as of this encounter: 4' 7.5" (1.41 m).   Weight as of this encounter: 87.998 kg (194 lb). Advance diet Up with therapy Discharge to SNF, pending BM  DVT Prophylaxis - Lovenox, Foot Pumps and TED hose Weight-Bearing as tolerated to right leg D/C O2 and Pulse OX and try on Room Air  T. Cranston Neighborhris Gaines, PA-C [facility] 02/17/2015, 7:38 AM

## 2015-02-17 NOTE — Clinical Social Work Note (Signed)
[facility]. Patient is in agreement and she will transport via EMS. Discharge information sent by CSW on Friday. York SpanielMonica Parneet Glantz MSW,LCSW [phone]

## 2015-02-17 NOTE — Clinical Social Work Placement (Signed)
   CLINICAL SOCIAL WORK PLACEMENT  NOTE  Date:  02/17/2015  Patient Details  Name: Deborah Miller MRN: [id number] Date of Birth: 21-Mar-1956  Clinical Social Work is seeking post-discharge placement for this patient at the Skilled  Nursing Facility level of care (*CSW will initial, date and re-position this form in  chart as items are completed):  Yes   Patient/family provided with [place] Clinical Social Work Department's list of facilities offering this level of care within the geographic area requested by the patient (or if unable, by the patient's family).  Yes   Patient/family informed of their freedom to choose among providers that offer the needed level of care, that participate in Medicare, Medicaid or managed care program needed by the patient, have an available bed and are willing to accept the patient.  Yes   Patient/family informed of [facility]'s ownership interest in [facility] and [facility], as well as of the fact that they are under no obligation to receive care at these facilities.  PASRR submitted to EDS on 02/15/15     PASRR number received on 02/15/15     Existing PASRR number confirmed on       FL2 transmitted to all facilities in geographic area requested by pt/family on 02/15/15     FL2 transmitted to all facilities within larger geographic area on       Patient informed that his/her managed care company has contracts with or will negotiate with certain facilities, including the following:        Yes   Patient/family informed of bed offers received.  Patient chooses bed at  [facility] )     Physician recommends and patient chooses bed at  [facility](SNF)    Patient to be transferred to  [facility] on 02/16/15.  Patient to be transferred to facility by  (EMS)     Patient family notified on 02/17/15 of transfer.  Name of family member notified:   (Patient's husband was in the patient's room)     PHYSICIAN Please sign FL2     Additional  Comment:    _______________________________________________ York SpanielMonica Jermal Dismuke, LCSW 02/17/2015, 1:04 PM

## 2015-02-17 NOTE — Progress Notes (Signed)
Physical Therapy Treatment Patient Details Name: Deborah Miller MRN: [id number] DOB: [birth year] Today's Date: 02/17/2015    History of Present Illness This patient is a 59 year old female who [facility] R [facility]R (posterior)    PT Comments    Pt continues to show good motivation and though she has some pain and limitations has shown increased mobility, strength and gait with each session. Pt able to do most acts today w/o needing direct physical assist.  Follow Up Recommendations  SNF     Equipment Recommendations       Recommendations for Other Services       Precautions / Restrictions Precautions Precautions: Posterior Hip;Fall Restrictions RLE Weight Bearing: Weight bearing as tolerated    Mobility  Bed Mobility Overal bed mobility: Modified Independent Bed Mobility: Supine to Sit     Supine to sit: Min guard     General bed mobility comments: Pt able to get her legs to EOB and with heavy UE use of the rails was able to get to sitting w/o direct assist  Transfers Overall transfer level: Modified independent Equipment used: Rolling walker (2 wheeled) Transfers: Sit to/from Stand Sit to Stand: Min guard         General transfer comment: Pt shows good confidence getting to standing with minimal UE use  Ambulation/Gait Ambulation/Gait assistance: Min guard Ambulation Distance (Feet): 75 Feet Assistive device: Rolling walker (2 wheeled)       General Gait Details: Pt shows very little hesitancy with R LE WBing and is able to maintain consistent cadence and forward momentum during increased ambualtion this AM.  No LOBs or safety concerns.    Stairs            Wheelchair Mobility    Modified Rankin (Stroke Patients Only)       Balance                                    Cognition Arousal/Alertness: Awake/alert Behavior During Therapy: WFL for tasks assessed/performed Overall Cognitive Status: Within Functional Limits for  tasks assessed                      Exercises Total Joint Exercises Ankle Circles/Pumps: AROM;Both;20 reps;Supine Quad Sets: Strengthening;15 reps Short Arc Quad: Right;AAROM;15 reps;Supine Heel Slides: AAROM;10 reps Hip ABduction/ADduction: 15 reps;AROM;Strengthening Straight Leg Raises: PROM;AAROM;10 reps    General Comments        Pertinent Vitals/Pain Pain Score: 3     Home Living                      Prior Function            PT Goals (current goals can now be found in the care plan section) Progress towards PT goals: Progressing toward goals    Frequency  BID    PT Plan Current plan remains appropriate    Co-evaluation             End of Session Equipment Utilized During Treatment: Gait belt Activity Tolerance: Patient limited by fatigue;Patient limited by pain Patient left: with chair alarm set     Time: [id number] PT Time Calculation (min) (ACUTE ONLY): 28 min  Charges:  $Gait Training: 8-22 mins $Therapeutic Exercise: 8-22 mins  G Codes:     Loran Senters, PT, DPT [phone]  Malachi Pro 02/17/2015, 12:38 PM

## 2015-02-18 DIAGNOSIS — E119 Type 2 diabetes mellitus without complications: Secondary | ICD-10-CM | POA: Diagnosis present

## 2015-02-18 DIAGNOSIS — Z794 Long term (current) use of insulin: Secondary | ICD-10-CM | POA: Diagnosis not present

## 2015-02-18 LAB — GLUCOSE, CAPILLARY
GLUCOSE-CAPILLARY: 114 mg/dL — AB (ref 65–99)
Glucose-Capillary: 123 mg/dL — ABNORMAL HIGH (ref 65–99)
Glucose-Capillary: 154 mg/dL — ABNORMAL HIGH (ref 65–99)

## 2015-02-19 DIAGNOSIS — E119 Type 2 diabetes mellitus without complications: Secondary | ICD-10-CM | POA: Diagnosis not present

## 2015-02-19 LAB — GLUCOSE, CAPILLARY
GLUCOSE-CAPILLARY: 103 mg/dL — AB (ref 65–99)
GLUCOSE-CAPILLARY: 104 mg/dL — AB (ref 65–99)
GLUCOSE-CAPILLARY: 108 mg/dL — AB (ref 65–99)

## 2015-02-20 DIAGNOSIS — E119 Type 2 diabetes mellitus without complications: Secondary | ICD-10-CM | POA: Diagnosis not present

## 2015-02-20 LAB — GLUCOSE, CAPILLARY: Glucose-Capillary: 113 mg/dL — ABNORMAL HIGH (ref 65–99)

## 2015-02-23 DIAGNOSIS — E119 Type 2 diabetes mellitus without complications: Secondary | ICD-10-CM | POA: Diagnosis not present

## 2015-02-23 LAB — GLUCOSE, CAPILLARY: GLUCOSE-CAPILLARY: 132 mg/dL — AB (ref 65–99)

## 2015-02-24 DIAGNOSIS — E119 Type 2 diabetes mellitus without complications: Secondary | ICD-10-CM | POA: Diagnosis not present

## 2015-02-24 LAB — GLUCOSE, CAPILLARY: GLUCOSE-CAPILLARY: 113 mg/dL — AB (ref 65–99)

## 2015-02-25 DIAGNOSIS — E119 Type 2 diabetes mellitus without complications: Secondary | ICD-10-CM | POA: Diagnosis not present

## 2015-02-25 LAB — GLUCOSE, CAPILLARY: Glucose-Capillary: 98 mg/dL (ref 65–99)

## 2015-05-01 DIAGNOSIS — E119 Type 2 diabetes mellitus without complications: Secondary | ICD-10-CM | POA: Insufficient documentation

## 2015-06-04 ENCOUNTER — Other Ambulatory Visit: Payer: Self-pay | Admitting: Unknown Physician Specialty

## 2015-06-04 DIAGNOSIS — R921 Mammographic calcification found on diagnostic imaging of breast: Secondary | ICD-10-CM

## 2015-06-25 ENCOUNTER — Other Ambulatory Visit: Payer: Self-pay | Admitting: Unknown Physician Specialty

## 2015-06-25 ENCOUNTER — Ambulatory Visit
Admission: RE | Admit: 2015-06-25 | Discharge: 2015-06-25 | Disposition: A | Discharge status: Home / Self Care | Payer: BC Managed Care – PPO | Source: Ambulatory Visit | Attending: Unknown Physician Specialty | Admitting: Unknown Physician Specialty

## 2015-06-25 DIAGNOSIS — R921 Mammographic calcification found on diagnostic imaging of breast: Secondary | ICD-10-CM | POA: Insufficient documentation

## 2015-10-31 DIAGNOSIS — J4540 Moderate persistent asthma, uncomplicated: Secondary | ICD-10-CM | POA: Insufficient documentation

## 2015-11-09 ENCOUNTER — Encounter: Payer: Self-pay | Admitting: Unknown Physician Specialty

## 2015-11-28 ENCOUNTER — Encounter: Payer: Self-pay | Admitting: Obstetrics and Gynecology

## 2015-11-29 ENCOUNTER — Ambulatory Visit (INDEPENDENT_AMBULATORY_CARE_PROVIDER_SITE_OTHER): Payer: BC Managed Care – PPO | Admitting: Obstetrics and Gynecology

## 2015-11-29 ENCOUNTER — Encounter: Payer: Self-pay | Admitting: Obstetrics and Gynecology

## 2015-11-29 VITALS — BP 146/77 | HR 106 | Ht <= 58 in | Wt 210.8 lb

## 2015-11-29 DIAGNOSIS — Z8742 Personal history of other diseases of the female genital tract: Secondary | ICD-10-CM

## 2015-11-29 DIAGNOSIS — N951 Menopausal and female climacteric states: Secondary | ICD-10-CM

## 2015-11-29 DIAGNOSIS — Z8262 Family history of osteoporosis: Secondary | ICD-10-CM | POA: Insufficient documentation

## 2015-11-29 NOTE — Progress Notes (Signed)
GYN ENCOUNTER NOTE  Subjective:       Deborah Miller is a 60 y.o. G0P0 female is here for gynecologic evaluation of the following issues:  1. Transition of care  Colonoscopy-2009; next colonoscopy due in 2017 Pap smear 06/2015-within normal limits Last mammogram 11/2014-right breast; next mammogram due in February 2018  Patient had been seen long-term by Dr. Luella Cook at [facility]. She is now presenting today for a conference appointment to review health issues prior to establishing annual gynecologic visit which is due in February 2018.  Primary care physician is Dr. Bethann Punches   Gynecologic History No LMP recorded. Patient is postmenopausal. Contraception: post menopausal status Last Pap: 06/2015 normal Last mammogram:  Mammogram due 06/2015  Obstetric History OB History  Gravida Para Term Preterm AB Living  0            SAB TAB Ectopic Multiple Live Births                 Obstetric Comments  1st Menstrual Cycle:  Age 88    Past Medical History:  Diagnosis Date  . Acid reflux   . Anxiety   . Arthritis   . Asthma 1957  . Breast microcalcification, mammographic 2014  . Breast screening, unspecified   . Carpal tunnel syndrome   . Depression   . Diabetes mellitus without complication (HCC)   . Fecal smearing   . Headache   . Hemorrhoids 2012  . History of hiatal hernia   . Hypertension   . Pain    chronic lbp,stenosis  . Shortness of breath dyspnea    increased with anxiety  . Sleep apnea    cpap    Past Surgical History:  Procedure Laterality Date  . BACK SURGERY  2010   C 5 & 6 fusion  . BREAST BIOPSY Left 2014   Stereo Bx  . BUNIONECTOMY  1996  . carpal tunnel--left hand  Left 2012  . CHOLECYSTECTOMY  2002  . COLONOSCOPY  2009   Dr. Mechele Collin  . DILATION AND CURETTAGE OF UTERUS  1987  . ganglion cyst removal   1988  . KNEE SURGERY Left   . left breast biopsy  2006  . NASAL SEPTUM SURGERY  1975  . neck injection  2014  . NOSE SURGERY  2001  .  tonsilloadenoidectomy  2003  . TOTAL HIP ARTHROPLASTY Right 02/14/2015   Procedure: TOTAL HIP ARTHROPLASTY;  Surgeon: Donato Heinz, MD;  Location: [facility] ORS;  Service: Orthopedics;  Laterality: Right;  . UPPER GI ENDOSCOPY  2009    Current Outpatient Prescriptions on File Prior to Visit  Medication Sig Dispense Refill  . ADVAIR HFA 115-21 MCG/ACT inhaler Inhale 1-2 puffs into the lungs daily.    Marland Kitchen albuterol (PROVENTIL HFA;VENTOLIN HFA) 108 (90 BASE) MCG/ACT inhaler Inhale 2 puffs into the lungs 3 (three) times daily as needed for wheezing or shortness of breath.    . ALPRAZolam (XANAX) 0.25 MG tablet Take 1 tablet by mouth daily.    . calcium carbonate (TUMS - DOSED IN MG ELEMENTAL CALCIUM) 500 MG chewable tablet Chew 1 tablet by mouth 2 (two) times daily as needed for indigestion or heartburn.    . Calcium Carbonate-Vitamin D (CALCIUM 600 + D PO) Take 1 tablet by mouth daily.    . cholestyramine light (PREVALITE) 4 GM/DOSE powder Take 1 g by mouth daily as needed.    . docusate sodium (COLACE) 100 MG capsule Take 100 mg by mouth daily  as needed for mild constipation.    Marland Kitchen EPINEPHrine (EPIPEN 2-PAK) 0.3 mg/0.3 mL IJ SOAJ injection Inject into the muscle once. AS NEEDED    . gabapentin (NEURONTIN) 300 MG capsule Take 2 capsules by mouth 2 (two) times daily.    Marland Kitchen L-Lysine 500 MG CAPS Take 2 capsules by mouth daily.    Marland Kitchen loratadine-pseudoephedrine (CLARITIN-D 12-HOUR) 5-120 MG per tablet Take 1 tablet by mouth daily.    . meloxicam (MOBIC) 7.5 MG tablet Take 1 tablet by mouth 2 (two) times daily.     . Multiple Vitamin (MULTIVITAMIN) tablet Take 1 tablet by mouth daily.    Marland Kitchen omeprazole (PRILOSEC) 20 MG capsule Take 20 mg by mouth daily.    . potassium chloride (K-DUR) 10 MEQ tablet Take 10 mEq by mouth daily.    . sodium chloride (OCEAN) 0.65 % SOLN nasal spray Place 1 spray into both nostrils as needed for congestion.    Marland Kitchen SPIRIVA HANDIHALER 18 MCG inhalation capsule Place 1 capsule into  inhaler and inhale daily.    Marland Kitchen torsemide (DEMADEX) 20 MG tablet Take 1 tablet by mouth daily.    . traMADol (ULTRAM) 50 MG tablet Take 1-2 tablets (50-100 mg total) by mouth every 4 (four) hours as needed for moderate pain. 30 tablet 0  . valACYclovir (VALTREX) 1000 MG tablet Take 1 tablet by mouth 2 (two) times daily as needed.     No current facility-administered medications on file prior to visit.     Allergies  Allergen Reactions  . Augmentin [Amoxicillin-Pot Clavulanate] Nausea And Vomiting  . Latex     Blisters NOTE: Latex IgE NEGATIVE (<0.10)  . Tape     blisters    Social History   Social History  . Marital status: Married    Spouse name: N/A  . Number of children: N/A  . Years of education: N/A   Occupational History  . Not on file.   Social History Main Topics  . Smoking status: Former Smoker    Packs/day: 1.00    Years: 10.00    Quit date: 01/24/1987  . Smokeless tobacco: Never Used  . Alcohol use Yes     Comment: occ  . Drug use: No  . Sexual activity: No   Other Topics Concern  . Not on file   Social History Narrative  . No narrative on file    Family History  Problem Relation Age of Onset  . Ovarian cancer Maternal Grandmother   . Diabetes Father   . Heart disease Father   . Breast cancer Neg Hx   . Colon cancer Neg Hx     The following portions of the patient's history were reviewed and updated as appropriate: allergies, current medications, past family history, past medical history, past social history, past surgical history and problem list.  Review of Systems Review of Systems  Constitutional: Negative.   Respiratory: Negative.   Cardiovascular: Negative.   Gastrointestinal: Negative.   Genitourinary: Negative.   Musculoskeletal: Negative.   Skin: Negative.   Neurological: Negative.   Endo/Heme/Allergies: Negative.   Psychiatric/Behavioral: Negative.     Objective:   BP (!) 146/77   Pulse (!) 106   Ht 4\' 7"  (1.397 m)   Wt  210 lb 12.8 oz (95.6 kg)   BMI 48.99 kg/m  CONSTITUTIONAL: Well-developed, well-nourished female in no acute distress.  Physical exam-deferred     Assessment:   1. Menopausal syndrome  2. Family history of osteoporosis  3. History of  abnormal cervical Pap smear  4. Morbid obesity   Plan:   1. Return in February 2018 for annual exam 2. Continue with healthy eating and exercise with weight loss-slow steady 3. Continue with calcium and vitamin D supplementation daily 4. Continue with follow-up with Dr. Bethann Punches for internal medicine issues  A total of 20 minutes were spent face-to-face with the patient during this encounter and over half of that time dealt with counseling and coordination of care.  Herold Harms, MD  Note: This dictation was prepared with Dragon dictation along with smaller phrase technology. Any transcriptional errors that result from this process are unintentional.

## 2015-11-29 NOTE — Patient Instructions (Signed)
Preventive Care for Adults, Female A healthy lifestyle and preventive care can promote health and wellness. Preventive health guidelines for women include the following key practices.  A routine yearly physical is a good way to check with your health care provider about your health and preventive screening. It is a chance to share any concerns and updates on your health and to receive a thorough exam.  Visit your dentist for a routine exam and preventive care every 6 months. Brush your teeth twice a day and floss once a day. Good oral hygiene prevents tooth decay and gum disease.  The frequency of eye exams is based on your age, health, family medical history, use of contact lenses, and other factors. Follow your health care provider's recommendations for frequency of eye exams.  Eat a healthy diet. Foods like vegetables, fruits, whole grains, low-fat dairy products, and lean protein foods contain the nutrients you need without too many calories. Decrease your intake of foods high in solid fats, added sugars, and salt. Eat the right amount of calories for you.Get information about a proper diet from your health care provider, if necessary.  Regular physical exercise is one of the most important things you can do for your health. Most adults should get at least 150 minutes of moderate-intensity exercise (any activity that increases your heart rate and causes you to sweat) each week. In addition, most adults need muscle-strengthening exercises on 2 or more days a week.  Maintain a healthy weight. The body mass index (BMI) is a screening tool to identify possible weight problems. It provides an estimate of body fat based on height and weight. Your health care provider can find your BMI and can help you achieve or maintain a healthy weight.For adults 20 years and older:  A BMI below 18.5 is considered underweight.  A BMI of 18.5 to 24.9 is normal.  A BMI of 25 to 29.9 is considered  overweight.  A BMI of 30 and above is considered obese.  Maintain normal blood lipids and cholesterol levels by exercising and minimizing your intake of saturated fat. Eat a balanced diet with plenty of fruit and vegetables. Blood tests for lipids and cholesterol should begin at age 29 and be repeated every 5 years. If your lipid or cholesterol levels are high, you are over 50, or you are at high risk for heart disease, you may need your cholesterol levels checked more frequently.Ongoing high lipid and cholesterol levels should be treated with medicines if diet and exercise are not working.  If you smoke, find out from your health care provider how to quit. If you do not use tobacco, do not start.  Lung cancer screening is recommended for adults aged 35-80 years who are at high risk for developing lung cancer because of a history of smoking. A yearly low-dose CT scan of the lungs is recommended for people who have at least a 30-pack-year history of smoking and are a current smoker or have quit within the past 15 years. A pack year of smoking is smoking an average of 1 pack of cigarettes a day for 1 year (for example: 1 pack a day for 30 years or 2 packs a day for 15 years). Yearly screening should continue until the smoker has stopped smoking for at least 15 years. Yearly screening should be stopped for people who develop a health problem that would prevent them from having lung cancer treatment.  If you are pregnant, do not drink alcohol. If you  are breastfeeding, be very cautious about drinking alcohol. If you are not pregnant and choose to drink alcohol, do not have more than 1 drink per day. One drink is considered to be 12 ounces (355 mL) of beer, 5 ounces (148 mL) of wine, or 1.5 ounces (44 mL) of liquor.  Avoid use of street drugs. Do not share needles with anyone. Ask for help if you need support or instructions about stopping the use of drugs.  High blood pressure causes heart disease and  increases the risk of stroke. Your blood pressure should be checked at least every 1 to 2 years. Ongoing high blood pressure should be treated with medicines if weight loss and exercise do not work.  If you are 12-58 years old, ask your health care provider if you should take aspirin to prevent strokes.  Diabetes screening is done by taking a blood sample to check your blood glucose level after you have not eaten for a certain period of time (fasting). If you are not overweight and you do not have risk factors for diabetes, you should be screened once every 3 years starting at age 76. If you are overweight or obese and you are 18-1 years of age, you should be screened for diabetes every year as part of your cardiovascular risk assessment.  Breast cancer screening is essential preventive care for women. You should practice "breast self-awareness." This means understanding the normal appearance and feel of your breasts and may include breast self-examination. Any changes detected, no matter how small, should be reported to a health care provider. Women in their 73s and 30s should have a clinical breast exam (CBE) by a health care provider as part of a regular health exam every 1 to 3 years. After age 36, women should have a CBE every year. Starting at age 50, women should consider having a mammogram (breast X-ray test) every year. Women who have a family history of breast cancer should talk to their health care provider about genetic screening. Women at a high risk of breast cancer should talk to their health care providers about having an MRI and a mammogram every year.  Breast cancer gene (BRCA)-related cancer risk assessment is recommended for women who have family members with BRCA-related cancers. BRCA-related cancers include breast, ovarian, tubal, and peritoneal cancers. Having family members with these cancers may be associated with an increased risk for harmful changes (mutations) in the breast  cancer genes BRCA1 and BRCA2. Results of the assessment will determine the need for genetic counseling and BRCA1 and BRCA2 testing.  Your health care provider may recommend that you be screened regularly for cancer of the pelvic organs (ovaries, uterus, and vagina). This screening involves a pelvic examination, including checking for microscopic changes to the surface of your cervix (Pap test). You may be encouraged to have this screening done every 3 years, beginning at age 40.  For women ages 32-65, health care providers may recommend pelvic exams and Pap testing every 3 years, or they may recommend the Pap and pelvic exam, combined with testing for human papilloma virus (HPV), every 5 years. Some types of HPV increase your risk of cervical cancer. Testing for HPV may also be done on women of any age with unclear Pap test results.  Other health care providers may not recommend any screening for nonpregnant women who are considered low risk for pelvic cancer and who do not have symptoms. Ask your health care provider if a screening pelvic exam is right  you.  If you have had past treatment for cervical cancer or a condition that could lead to cancer, you need Pap tests and screening for cancer for at least 20 years after your treatment. If Pap tests have been discontinued, your risk factors (such as having a new sexual partner) need to be reassessed to determine if screening should resume. Some women have medical problems that increase the chance of getting cervical cancer. In these cases, your health care provider may recommend more frequent screening and Pap tests.  Colorectal cancer can be detected and often prevented. Most routine colorectal cancer screening begins at the age of 50 years and continues through age 75 years. However, your health care provider may recommend screening at an earlier age if you have risk factors for colon cancer. On a yearly basis, your health care provider may provide home test kits to check  for hidden blood in the stool. Use of a small camera at the end of a tube, to directly examine the colon (sigmoidoscopy or colonoscopy), can detect the earliest forms of colorectal cancer. Talk to your health care provider about this at age 50, when routine screening begins. Direct exam of the colon should be repeated every 5-10 years through age 75 years, unless early forms of precancerous polyps or small growths are found.  People who are at an increased risk for hepatitis B should be screened for this virus. You are considered at high risk for hepatitis B if:  You were born in a country where hepatitis B occurs often. Talk with your health care provider about which countries are considered high risk.  Your parents were born in a high-risk country and you have not received a shot to protect against hepatitis B (hepatitis B vaccine).  You have HIV or AIDS.  You use needles to inject street drugs.  You live with, or have sex with, someone who has hepatitis B.  You get hemodialysis treatment.  You take certain medicines for conditions like cancer, organ transplantation, and autoimmune conditions.  Hepatitis C blood testing is recommended for all people born from 1945 through 1965 and any individual with known risks for hepatitis C.  Practice safe sex. Use condoms and avoid high-risk sexual practices to reduce the spread of sexually transmitted infections (STIs). STIs include gonorrhea, chlamydia, syphilis, trichomonas, herpes, HPV, and human immunodeficiency virus (HIV). Herpes, HIV, and HPV are viral illnesses that have no cure. They can result in disability, cancer, and death.  You should be screened for sexually transmitted illnesses (STIs) including gonorrhea and chlamydia if:  You are sexually active and are younger than 24 years.  You are older than 24 years and your health care provider tells you that you are at risk for this type of infection.  Your sexual activity has changed  since you were last screened and you are at an increased risk for chlamydia or gonorrhea. Ask your health care provider if you are at risk.  If you are at risk of being infected with HIV, it is recommended that you take a prescription medicine daily to prevent HIV infection. This is called preexposure prophylaxis (PrEP). You are considered at risk if:  You are sexually active and do not regularly use condoms or know the HIV status of your partner(s).  You take drugs by injection.  You are sexually active with a partner who has HIV.  Talk with your health care provider about whether you are at high risk of being infected with HIV. If   you choose to begin PrEP, you should first be tested for HIV. You should then be tested every 3 months for as long as you are taking PrEP.  Osteoporosis is a disease in which the bones lose minerals and strength with aging. This can result in serious bone fractures or breaks. The risk of osteoporosis can be identified using a bone density scan. Women ages 65 years and over and women at risk for fractures or osteoporosis should discuss screening with their health care providers. Ask your health care provider whether you should take a calcium supplement or vitamin D to reduce the rate of osteoporosis.  Menopause can be associated with physical symptoms and risks. Hormone replacement therapy is available to decrease symptoms and risks. You should talk to your health care provider about whether hormone replacement therapy is right for you.  Use sunscreen. Apply sunscreen liberally and repeatedly throughout the day. You should seek shade when your shadow is shorter than you. Protect yourself by wearing long sleeves, pants, a wide-brimmed hat, and sunglasses year round, whenever you are outdoors.  Once a month, do a whole body skin exam, using a mirror to look at the skin on your back. Tell your health care provider of new moles, moles that have irregular borders, moles that  are larger than a pencil eraser, or moles that have changed in shape or color.  Stay current with required vaccines (immunizations).  Influenza vaccine. All adults should be immunized every year.  Tetanus, diphtheria, and acellular pertussis (Td, Tdap) vaccine. Pregnant women should receive 1 dose of Tdap vaccine during each pregnancy. The dose should be obtained regardless of the length of time since the last dose. Immunization is preferred during the 27th-36th week of gestation. An adult who has not previously received Tdap or who does not know her vaccine status should receive 1 dose of Tdap. This initial dose should be followed by tetanus and diphtheria toxoids (Td) booster doses every 10 years. Adults with an unknown or incomplete history of completing a 3-dose immunization series with Td-containing vaccines should begin or complete a primary immunization series including a Tdap dose. Adults should receive a Td booster every 10 years.  Varicella vaccine. An adult without evidence of immunity to varicella should receive 2 doses or a second dose if she has previously received 1 dose. Pregnant females who do not have evidence of immunity should receive the first dose after pregnancy. This first dose should be obtained before leaving the health care facility. The second dose should be obtained 4-8 weeks after the first dose.  Human papillomavirus (HPV) vaccine. Females aged 13-26 years who have not received the vaccine previously should obtain the 3-dose series. The vaccine is not recommended for use in pregnant females. However, pregnancy testing is not needed before receiving a dose. If a female is found to be pregnant after receiving a dose, no treatment is needed. In that case, the remaining doses should be delayed until after the pregnancy. Immunization is recommended for any person with an immunocompromised condition through the age of 26 years if she did not get any or all doses earlier. During the  3-dose series, the second dose should be obtained 4-8 weeks after the first dose. The third dose should be obtained 24 weeks after the first dose and 16 weeks after the second dose.  Zoster vaccine. One dose is recommended for adults aged 60 years or older unless certain conditions are present.  Measles, mumps, and rubella (MMR) vaccine. Adults born   born before [birth year] generally are considered immune to measles and mumps. Adults born in [birth year] or later should have 1 or more doses of MMR vaccine unless there is a contraindication to the vaccine or there is laboratory evidence of immunity to each of the three diseases. A routine second dose of MMR vaccine should be obtained at least 28 days after the first dose for students attending postsecondary schools, health care workers, or international travelers. People who received inactivated measles vaccine or an unknown type of measles vaccine during 1963-1967 should receive 2 doses of MMR vaccine. People who received inactivated mumps vaccine or an unknown type of mumps vaccine before 1979 and are at high risk for mumps infection should consider immunization with 2 doses of MMR vaccine. For females of childbearing age, rubella immunity should be determined. If there is no evidence of immunity, females who are not pregnant should be vaccinated. If there is no evidence of immunity, females who are pregnant should delay immunization until after pregnancy. Unvaccinated health care workers born before [birth year] who lack laboratory evidence of measles, mumps, or rubella immunity or laboratory confirmation of disease should consider measles and mumps immunization with 2 doses of MMR vaccine or rubella immunization with 1 dose of MMR vaccine.  Pneumococcal 13-valent conjugate (PCV13) vaccine. When indicated, a person who is uncertain of his immunization history and has no record of immunization should receive the PCV13 vaccine. All adults 62 years of age and older  should receive this vaccine. An adult aged 39 years or older who has certain medical conditions and has not been previously immunized should receive 1 dose of PCV13 vaccine. This PCV13 should be followed with a dose of pneumococcal polysaccharide (PPSV23) vaccine. Adults who are at high risk for pneumococcal disease should obtain the PPSV23 vaccine at least 8 weeks after the dose of PCV13 vaccine. Adults older than 60 years of age who have normal immune system function should obtain the PPSV23 vaccine dose at least 1 year after the dose of PCV13 vaccine.  Pneumococcal polysaccharide (PPSV23) vaccine. When PCV13 is also indicated, PCV13 should be obtained first. All adults aged 47 years and older should be immunized. An adult younger than age 27 years who has certain medical conditions should be immunized. Any person who resides in a nursing home or long-term care facility should be immunized. An adult smoker should be immunized. People with an immunocompromised condition and certain other conditions should receive both PCV13 and PPSV23 vaccines. People with human immunodeficiency virus (HIV) infection should be immunized as soon as possible after diagnosis. Immunization during chemotherapy or radiation therapy should be avoided. Routine use of PPSV23 vaccine is not recommended for American Indians, [place] Natives, or people younger than 65 years unless there are medical conditions that require PPSV23 vaccine. When indicated, people who have unknown immunization and have no record of immunization should receive PPSV23 vaccine. One-time revaccination 5 years after the first dose of PPSV23 is recommended for people aged 19-64 years who have chronic kidney failure, nephrotic syndrome, asplenia, or immunocompromised conditions. People who received 1-2 doses of PPSV23 before age 57 years should receive another dose of PPSV23 vaccine at age 42 years or later if at least 5 years have passed since the previous dose. Doses  of PPSV23 are not needed for people immunized with PPSV23 at or after age 51 years.  Meningococcal vaccine. Adults with asplenia or persistent complement component deficiencies should receive 2 doses of quadrivalent meningococcal conjugate (MenACWY-D) vaccine. The doses should be  at least 2 months apart. Microbiologists working with certain meningococcal bacteria, military recruits, people at risk during an outbreak, and people who travel to or live in countries with a high rate of meningitis should be immunized. A first-year college student up through age 21 years who is living in a residence hall should receive a dose if she did not receive a dose on or after her 16th birthday. Adults who have certain high-risk conditions should receive one or more doses of vaccine.  Hepatitis A vaccine. Adults who wish to be protected from this disease, have certain high-risk conditions, work with hepatitis A-infected animals, work in hepatitis A research labs, or travel to or work in countries with a high rate of hepatitis A should be immunized. Adults who were previously unvaccinated and who anticipate close contact with an international adoptee during the first 60 days after arrival in the [place] from a country with a high rate of hepatitis A should be immunized.  Hepatitis B vaccine. Adults who wish to be protected from this disease, have certain high-risk conditions, may be exposed to blood or other infectious body fluids, are household contacts or sex partners of hepatitis B positive people, are clients or workers in certain care facilities, or travel to or work in countries with a high rate of hepatitis B should be immunized.  Haemophilus influenzae type b (Hib) vaccine. A previously unvaccinated person with asplenia or sickle cell disease or having a scheduled splenectomy should receive 1 dose of Hib vaccine. Regardless of previous immunization, a recipient of a hematopoietic stem cell transplant should receive a  3-dose series 6-12 months after her successful transplant. Hib vaccine is not recommended for adults with HIV infection. Preventive Services / Frequency Ages 19 to 39 years  Blood pressure check.** / Every 3-5 years.  Lipid and cholesterol check.** / Every 5 years beginning at age 20.  Clinical breast exam.** / Every 3 years for women in their 20s and 30s.  BRCA-related cancer risk assessment.** / For women who have family members with a BRCA-related cancer (breast, ovarian, tubal, or peritoneal cancers).  Pap test.** / Every 2 years from ages 21 through 29. Every 3 years starting at age 30 through age 65 or 70 with a history of 3 consecutive normal Pap tests.  HPV screening.** / Every 3 years from ages 30 through ages 65 to 70 with a history of 3 consecutive normal Pap tests.  Hepatitis C blood test.** / For any individual with known risks for hepatitis C.  Skin self-exam. / Monthly.  Influenza vaccine. / Every year.  Tetanus, diphtheria, and acellular pertussis (Tdap, Td) vaccine.** / Consult your health care provider. Pregnant women should receive 1 dose of Tdap vaccine during each pregnancy. 1 dose of Td every 10 years.  Varicella vaccine.** / Consult your health care provider. Pregnant females who do not have evidence of immunity should receive the first dose after pregnancy.  HPV vaccine. / 3 doses over 6 months, if 26 and younger. The vaccine is not recommended for use in pregnant females. However, pregnancy testing is not needed before receiving a dose.  Measles, mumps, rubella (MMR) vaccine.** / You need at least 1 dose of MMR if you were born in 1957 or later. You may also need a 2nd dose. For females of childbearing age, rubella immunity should be determined. If there is no evidence of immunity, females who are not pregnant should be vaccinated. If there is no evidence of immunity, females who are   are pregnant should delay immunization until after  pregnancy.  Pneumococcal 13-valent conjugate (PCV13) vaccine.** / Consult your health care provider.  Pneumococcal polysaccharide (PPSV23) vaccine.** / 1 to 2 doses if you smoke cigarettes or if you have certain conditions.  Meningococcal vaccine.** / 1 dose if you are age 60 to 21 years and a Market researcher living in a residence hall, or have one of several medical conditions, you need to get vaccinated against meningococcal disease. You may also need additional booster doses.  Hepatitis A vaccine.** / Consult your health care provider.  Hepatitis B vaccine.** / Consult your health care provider.  Haemophilus influenzae type b (Hib) vaccine.** / Consult your health care provider. Ages 33 to 81 years  Blood pressure check.** / Every year.  Lipid and cholesterol check.** / Every 5 years beginning at age 102 years.  Lung cancer screening. / Every year if you are aged 33-80 years and have a 30-pack-year history of smoking and currently smoke or have quit within the past 15 years. Yearly screening is stopped once you have quit smoking for at least 15 years or develop a health problem that would prevent you from having lung cancer treatment.  Clinical breast exam.** / Every year after age 58 years.  BRCA-related cancer risk assessment.** / For women who have family members with a BRCA-related cancer (breast, ovarian, tubal, or peritoneal cancers).  Mammogram.** / Every year beginning at age 76 years and continuing for as long as you are in good health. Consult with your health care provider.  Pap test.** / Every 3 years starting at age 34 years through age 65 or 58 years with a history of 3 consecutive normal Pap tests.  HPV screening.** / Every 3 years from ages 19 years through ages 76 to 65 years with a history of 3 consecutive normal Pap tests.  Fecal occult blood test (FOBT) of stool. / Every year beginning at age 43 years and continuing until age 46 years. You may not need  to do this test if you get a colonoscopy every 10 years.  Flexible sigmoidoscopy or colonoscopy.** / Every 5 years for a flexible sigmoidoscopy or every 10 years for a colonoscopy beginning at age 84 years and continuing until age 83 years.  Hepatitis C blood test.** / For all people born from [birth year] through 1965 and any individual with known risks for hepatitis C.  Skin self-exam. / Monthly.  Influenza vaccine. / Every year.  Tetanus, diphtheria, and acellular pertussis (Tdap/Td) vaccine.** / Consult your health care provider. Pregnant women should receive 1 dose of Tdap vaccine during each pregnancy. 1 dose of Td every 10 years.  Varicella vaccine.** / Consult your health care provider. Pregnant females who do not have evidence of immunity should receive the first dose after pregnancy.  Zoster vaccine.** / 1 dose for adults aged 58 years or older.  Measles, mumps, rubella (MMR) vaccine.** / You need at least 1 dose of MMR if you were born in 1957 or later. You may also need a second dose. For females of childbearing age, rubella immunity should be determined. If there is no evidence of immunity, females who are not pregnant should be vaccinated. If there is no evidence of immunity, females who are pregnant should delay immunization until after pregnancy.  Pneumococcal 13-valent conjugate (PCV13) vaccine.** / Consult your health care provider.  Pneumococcal polysaccharide (PPSV23) vaccine.** / 1 to 2 doses if you smoke cigarettes or if you have certain conditions.  Meningococcal vaccine.** /  Consult your health care provider.  Hepatitis A vaccine.** / Consult your health care provider.  Hepatitis B vaccine.** / Consult your health care provider.  Haemophilus influenzae type b (Hib) vaccine.** / Consult your health care provider. Ages 80 years and over  Blood pressure check.** / Every year.  Lipid and cholesterol check.** / Every 5 years beginning at age 62 years.  Lung cancer  screening. / Every year if you are aged 32-80 years and have a 30-pack-year history of smoking and currently smoke or have quit within the past 15 years. Yearly screening is stopped once you have quit smoking for at least 15 years or develop a health problem that would prevent you from having lung cancer treatment.  Clinical breast exam.** / Every year after age 61 years.  BRCA-related cancer risk assessment.** / For women who have family members with a BRCA-related cancer (breast, ovarian, tubal, or peritoneal cancers).  Mammogram.** / Every year beginning at age 39 years and continuing for as long as you are in good health. Consult with your health care provider.  Pap test.** / Every 3 years starting at age 85 years through age 74 or 72 years with 3 consecutive normal Pap tests. Testing can be stopped between 65 and 70 years with 3 consecutive normal Pap tests and no abnormal Pap or HPV tests in the past 10 years.  HPV screening.** / Every 3 years from ages 55 years through ages 67 or 77 years with a history of 3 consecutive normal Pap tests. Testing can be stopped between 65 and 70 years with 3 consecutive normal Pap tests and no abnormal Pap or HPV tests in the past 10 years.  Fecal occult blood test (FOBT) of stool. / Every year beginning at age 81 years and continuing until age 22 years. You may not need to do this test if you get a colonoscopy every 10 years.  Flexible sigmoidoscopy or colonoscopy.** / Every 5 years for a flexible sigmoidoscopy or every 10 years for a colonoscopy beginning at age 67 years and continuing until age 22 years.  Hepatitis C blood test.** / For all people born from [birth year] through 1965 and any individual with known risks for hepatitis C.  Osteoporosis screening.** / A one-time screening for women ages 8 years and over and women at risk for fractures or osteoporosis.  Skin self-exam. / Monthly.  Influenza vaccine. / Every year.  Tetanus, diphtheria, and  acellular pertussis (Tdap/Td) vaccine.** / 1 dose of Td every 10 years.  Varicella vaccine.** / Consult your health care provider.  Zoster vaccine.** / 1 dose for adults aged 56 years or older.  Pneumococcal 13-valent conjugate (PCV13) vaccine.** / Consult your health care provider.  Pneumococcal polysaccharide (PPSV23) vaccine.** / 1 dose for all adults aged 15 years and older.  Meningococcal vaccine.** / Consult your health care provider.  Hepatitis A vaccine.** / Consult your health care provider.  Hepatitis B vaccine.** / Consult your health care provider.  Haemophilus influenzae type b (Hib) vaccine.** / Consult your health care provider. ** Family history and personal history of risk and conditions may change your health care provider's recommendations.   This information is not intended to replace advice given to you by your health care provider. Make sure you discuss any questions you have with your health care provider.   Document Released: 06/17/2001 Document Revised: 05/12/2014 Document Reviewed: 09/16/2010 Elsevier Interactive Patient Education [employer].

## 2016-01-05 ENCOUNTER — Emergency Department
Admission: EM | Admit: 2016-01-05 | Discharge: 2016-01-05 | Disposition: A | Discharge status: Home / Self Care | Payer: BC Managed Care – PPO | Attending: Emergency Medicine | Admitting: Emergency Medicine

## 2016-01-05 ENCOUNTER — Emergency Department: Payer: BC Managed Care – PPO

## 2016-01-05 ENCOUNTER — Encounter: Payer: Self-pay | Admitting: Emergency Medicine

## 2016-01-05 DIAGNOSIS — G510 Bell's palsy: Secondary | ICD-10-CM | POA: Insufficient documentation

## 2016-01-05 DIAGNOSIS — E119 Type 2 diabetes mellitus without complications: Secondary | ICD-10-CM | POA: Insufficient documentation

## 2016-01-05 DIAGNOSIS — R29810 Facial weakness: Secondary | ICD-10-CM | POA: Diagnosis present

## 2016-01-05 DIAGNOSIS — Z87891 Personal history of nicotine dependence: Secondary | ICD-10-CM | POA: Diagnosis not present

## 2016-01-05 DIAGNOSIS — I10 Essential (primary) hypertension: Secondary | ICD-10-CM | POA: Insufficient documentation

## 2016-01-05 DIAGNOSIS — Z79899 Other long term (current) drug therapy: Secondary | ICD-10-CM | POA: Diagnosis not present

## 2016-01-05 DIAGNOSIS — J45909 Unspecified asthma, uncomplicated: Secondary | ICD-10-CM | POA: Insufficient documentation

## 2016-01-05 LAB — GLUCOSE, CAPILLARY: GLUCOSE-CAPILLARY: 140 mg/dL — AB (ref 65–99)

## 2016-01-05 MED ORDER — PREDNISONE 20 MG PO TABS: 60.0000 mg | ORAL_TABLET | Freq: Every day | ORAL | 0 refills | Status: DC

## 2016-01-05 MED ORDER — VALACYCLOVIR HCL 500 MG PO TABS
1000.0000 mg | ORAL_TABLET | ORAL | Status: AC
  Administered 2016-01-05: 1000 mg via ORAL
  Filled 2016-01-05: qty 2

## 2016-01-05 MED ORDER — VALACYCLOVIR HCL 1 G PO TABS: 1000.0000 mg | ORAL_TABLET | Freq: Three times a day (TID) | ORAL | 0 refills | Status: AC

## 2016-01-05 MED ORDER — PREDNISONE 20 MG PO TABS
60.0000 mg | ORAL_TABLET | ORAL | Status: AC
Start: 2016-01-05 — End: 2016-01-05
  Administered 2016-01-05: 60 mg via ORAL
  Filled 2016-01-05: qty 3

## 2016-01-05 NOTE — ED Notes (Signed)
Pt in CT.

## 2016-01-05 NOTE — ED Notes (Signed)
Pt speaking with neurologist;

## 2016-01-05 NOTE — ED Triage Notes (Signed)
Pt presents with husband complaining of facial drooping since today at 4:30.  Patient denies weakness on either side, confusion, and headache.  Pt's right eye is tearing.

## 2016-01-05 NOTE — ED Provider Notes (Signed)
[facility] Emergency Department Provider Note  ____________________________________________   First MD Initiated Contact with Patient 01/05/16 1914     (approximate)  I have reviewed the triage vital signs and the nursing notes.   HISTORY  Chief Complaint Facial Droop    HPI Deborah Miller is a 60 y.o. female who presents for evaluation of acute onset right-sided facial droop.  She reports that she was eating dinner and realized that she was having trouble drooling and having things fall out of her mouth.  She was not able to hold a straw between her lips very well.  Then she talked to her family member and they noticed that the right side of her face was drooping.  They called a nurse line for their doctor and they mention it was probably Bell's palsy but suggested that she come to the emergency department for evaluation.  She was made a code stroke upon arrival given her age and the onset of symptoms being within the last several hours.  She was in for emergent head CT which questioned a very small area which could either be hemorrhage or calcification.  I saw her after she came back from CT.  She denies any recent fever/chills, chest pain, shortness of breath, nausea, vomiting, diarrhea.  She does not have a headache nor neck pain.  She has had no recent traumas.  She has had no numbness nor weakness in any of her extremities.  The only area that feels unusual to her is the right side of her face.  She describes the symptoms as severe and nothing is making them better nor worse.   Past Medical History:  Diagnosis Date  . Acid reflux   . Anxiety   . Arthritis   . Asthma 1957  . Breast microcalcification, mammographic 2014  . Breast screening, unspecified   . Carpal tunnel syndrome   . Depression   . Diabetes mellitus without complication (HCC)   . Fecal smearing   . Headache   . Hemorrhoids 2012  . History of hiatal hernia   . Hypertension   . Pain     chronic lbp,stenosis  . Shortness of breath dyspnea    increased with anxiety  . Sleep apnea    cpap    Patient Active Problem List   Diagnosis Date Noted  . Obesity, Class III, BMI 40-49.9 (morbid obesity) (HCC) 11/29/2015  . History of abnormal cervical Pap smear 11/29/2015  . Family history of osteoporosis 11/29/2015  . Menopausal syndrome 11/29/2015  . S/P total hip arthroplasty 02/14/2015  . Esophageal reflux 03/21/2014  . Other screening mammogram 05/26/2013  . Breast microcalcification, mammographic 10/28/2012  . Superficial skin lesion 10/28/2012    Past Surgical History:  Procedure Laterality Date  . BACK SURGERY  2010   C 5 & 6 fusion  . BREAST BIOPSY Left 2014   Stereo Bx  . BUNIONECTOMY  1996  . carpal tunnel--left hand  Left 2012  . CHOLECYSTECTOMY  2002  . COLONOSCOPY  2009   Dr. Mechele Collin  . DILATION AND CURETTAGE OF UTERUS  1987  . ganglion cyst removal   1988  . KNEE SURGERY Left   . left breast biopsy  2006  . NASAL SEPTUM SURGERY  1975  . neck injection  2014  . NOSE SURGERY  2001  . tonsilloadenoidectomy  2003  . TOTAL HIP ARTHROPLASTY Right 02/14/2015   Procedure: TOTAL HIP ARTHROPLASTY;  Surgeon: Donato Heinz, MD;  Location:  [facility] ORS;  Service: Orthopedics;  Laterality: Right;  . UPPER GI ENDOSCOPY  2009    Prior to Admission medications   Medication Sig Start Date End Date Taking? Authorizing Provider  ADVAIR HFA 115-21 MCG/ACT inhaler Inhale 1-2 puffs into the lungs daily. 10/10/12   Historical Provider, MD  albuterol (PROVENTIL HFA;VENTOLIN HFA) 108 (90 BASE) MCG/ACT inhaler Inhale 2 puffs into the lungs 3 (three) times daily as needed for wheezing or shortness of breath.    Historical Provider, MD  ALPRAZolam Prudy Feeler) 0.25 MG tablet Take 1 tablet by mouth daily. 10/30/12   Historical Provider, MD  calcium carbonate (TUMS - DOSED IN MG ELEMENTAL CALCIUM) 500 MG chewable tablet Chew 1 tablet by mouth 2 (two) times daily as needed for  indigestion or heartburn.    Historical Provider, MD  Calcium Carbonate-Vitamin D (CALCIUM 600 + D PO) Take 1 tablet by mouth daily.    Historical Provider, MD  cholestyramine light (PREVALITE) 4 GM/DOSE powder Take 1 g by mouth daily as needed.    Historical Provider, MD  docusate sodium (COLACE) 100 MG capsule Take 100 mg by mouth daily as needed for mild constipation.    Historical Provider, MD  EPINEPHrine (EPIPEN 2-PAK) 0.3 mg/0.3 mL IJ SOAJ injection Inject into the muscle once. AS NEEDED    Historical Provider, MD  gabapentin (NEURONTIN) 300 MG capsule Take 2 capsules by mouth 2 (two) times daily. 10/10/12   Historical Provider, MD  L-Lysine 500 MG CAPS Take 2 capsules by mouth daily.    Historical Provider, MD  loratadine-pseudoephedrine (CLARITIN-D 12-HOUR) 5-120 MG per tablet Take 1 tablet by mouth daily.    Historical Provider, MD  meloxicam (MOBIC) 7.5 MG tablet Take 1 tablet by mouth 2 (two) times daily.  07/29/12   Historical Provider, MD  Multiple Vitamin (MULTIVITAMIN) tablet Take 1 tablet by mouth daily.    Historical Provider, MD  omeprazole (PRILOSEC) 20 MG capsule Take 20 mg by mouth daily.    Historical Provider, MD  potassium chloride (K-DUR) 10 MEQ tablet Take 10 mEq by mouth daily.    Historical Provider, MD  predniSONE (DELTASONE) 20 MG tablet Take 3 tablets (60 mg total) by mouth daily. Continue for six days. 01/05/16   Loleta Rose, MD  sodium chloride (OCEAN) 0.65 % SOLN nasal spray Place 1 spray into both nostrils as needed for congestion.    Historical Provider, MD  SPIRIVA HANDIHALER 18 MCG inhalation capsule Place 1 capsule into inhaler and inhale daily. 10/22/12   Historical Provider, MD  torsemide (DEMADEX) 20 MG tablet Take 1 tablet by mouth daily. 10/22/12   Historical Provider, MD  traMADol (ULTRAM) 50 MG tablet Take 1-2 tablets (50-100 mg total) by mouth every 4 (four) hours as needed for moderate pain. 02/16/15   Tera Partridge, PA  valACYclovir (VALTREX) 1000 MG tablet  Take 1 tablet (1,000 mg total) by mouth 3 (three) times daily. 01/05/16 01/12/16  Loleta Rose, MD    Allergies Augmentin [amoxicillin-pot clavulanate]; Latex; and Tape  Family History  Problem Relation Age of Onset  . Ovarian cancer Maternal Grandmother   . Diabetes Father   . Heart disease Father   . Breast cancer Neg Hx   . Colon cancer Neg Hx     Social History Social History  Substance Use Topics  . Smoking status: Former Smoker    Packs/day: 1.00    Years: 10.00    Quit date: 01/24/1987  . Smokeless tobacco: Never Used  . Alcohol  use Yes     Comment: occ    Review of Systems Constitutional: No fever/chills Eyes: No visual changes. ENT: No sore throat. Cardiovascular: Denies chest pain. Respiratory: Denies shortness of breath. Gastrointestinal: No abdominal pain.  No nausea, no vomiting.  No diarrhea.  No constipation. Genitourinary: Negative for dysuria. Musculoskeletal: Negative for back pain. Skin: Negative for rash. Neurological: Right-sided facial droop  10-point ROS otherwise negative.  ____________________________________________   PHYSICAL EXAM:  VITAL SIGNS: ED Triage Vitals  Enc Vitals Group     BP 01/05/16 1859 (!) 155/94     Pulse Rate 01/05/16 1859 98     Resp 01/05/16 1859 16     Temp --      Temp src --      SpO2 01/05/16 1859 96 %     Weight 01/05/16 1900 212 lb (96.2 kg)     Height 01/05/16 1900 4\' 7"  (1.397 m)     Head Circumference --      Peak Flow --      Pain Score --      Pain Loc --      Pain Edu? --      Excl. in GC? --     Constitutional: Alert and oriented. Well appearing and in no acute distress. Eyes: Conjunctivae are normal. PERRL. EOMI. Head: Atraumatic. Nose: No congestion/rhinnorhea. Mouth/Throat: Mucous membranes are moist.  Oropharynx non-erythematous. Neck: No stridor.  No meningeal signs.   Cardiovascular: Normal rate, regular rhythm. Good peripheral circulation. Grossly normal heart sounds. Respiratory:  Normal respiratory effort.  No retractions. Lungs CTAB. Gastrointestinal: Soft and nontender. No distention.  Musculoskeletal: No lower extremity tenderness nor edema. No gross deformities of extremities. Neurologic:  Normal speech and language except for slight slur due to right facial droop.  She has normal light touch sensation.  Her right eye will not completely closed when she tries to squeeze it shut.  When she smiles broadly the right corner does not turn up.  When she tries to blow out her cheeks she cannot do so on the right side.  Otherwise completely neurologically intact with normal motor strength and sensation in all of her major muscle groups.  Normal finger to nose testing. Skin:  Skin is warm, dry and intact. No rash noted. Psychiatric: Mood and affect are normal. Speech and behavior are normal.  ____________________________________________   LABS (all labs ordered are listed, but only abnormal results are displayed)  Labs Reviewed  GLUCOSE, CAPILLARY - Abnormal; Notable for the following:       Result Value   Glucose-Capillary 140 (*)    All other components within normal limits   ____________________________________________  EKG  ED ECG REPORT I, Armanie Martine, the attending physician, personally viewed and interpreted this ECG.  Date: 01/05/2016 EKG Time: 19:22 Rate: 93 Rhythm: normal sinus rhythm QRS Axis: normal Intervals: normal ST/T Wave abnormalities: normal Conduction Disturbances: none Narrative Interpretation: unremarkable  ____________________________________________  RADIOLOGY   Ct Head Code Stroke W/o Cm  Result Date: 01/05/2016 CLINICAL DATA:  Code stroke.  Right facial droop. EXAM: CT HEAD WITHOUT CONTRAST TECHNIQUE: Contiguous axial images were obtained from the base of the skull through the vertex without intravenous contrast. COMPARISON:  None. FINDINGS: There is a 2 mm focus of hyperattenuation in the white matter of the left centrum  semiovale (series 2, image 21) which may reflect a tiny acute hemorrhage or calcification. There is no evidence of acute intracranial hemorrhage elsewhere. There is no evidence of acute large  territory infarct, mass, midline shift, or extra-axial fluid collection. The ventricles and sulci are within normal limits for age. Orbits are unremarkable. The visualized paranasal sinuses and mastoid air cells are clear. No acute osseous abnormality is identified. ASPECTS [facility] Stroke Program Early CT Score) - Ganglionic level infarction (caudate, lentiform nuclei, internal capsule, insula, M1-M3 cortex): 7 - Supraganglionic infarction (M4-M6 cortex): 3 Total score (0-10 with 10 being normal): 10 IMPRESSION: 1. Punctate focus of hemorrhage or calcification in the left centrum semiovale. 2. No evidence of acute cortically based infarct. 3. ASPECTS is 10. Critical Value/emergent results were called by telephone at the time of interpretation on 01/05/2016 at 7:29 pm to Dr. Mayford Knife, who verbally acknowledged these results. Electronically Signed   By: Sebastian Ache M.D.   On: 01/05/2016 19:31    ____________________________________________   PROCEDURES  Procedure(s) performed:   Procedures   Critical Care performed: No ____________________________________________   INITIAL IMPRESSION / ASSESSMENT AND PLAN / ED COURSE  Pertinent labs & imaging results that were available during my care of the patient were reviewed by me and considered in my medical decision making (see chart for details).  NIH Stroke Scale  Interval: Baseline Time: 19:15 Person Administering Scale: Yehuda Printup  Administer stroke scale items in the order listed. Record performance in each category after each subscale exam. Do not go back and change scores. Follow directions provided for each exam technique. Scores should reflect what the patient does, not what the clinician thinks the patient can do. The clinician should record answers  while administering the exam and work quickly. Except where indicated, the patient should not be coached (i.e., repeated requests to patient to make a special effort).   1a  Level of consciousness: 0=alert; keenly responsive  1b. LOC questions:  0=Performs both tasks correctly  1c. LOC commands: 0=Performs both tasks correctly  2.  Best Gaze: 0=normal  3.  Visual: 0=No visual loss  4. Facial Palsy: 1=Minor paralysis (flattened nasolabial fold, asymmetric on smiling)  5a.  Motor left arm: 0=No drift, limb holds 90 (or 45) degrees for full 10 seconds  5b.  Motor right arm: 0=No drift, limb holds 90 (or 45) degrees for full 10 seconds  6a. motor left leg: 0=No drift, limb holds 90 (or 45) degrees for full 10 seconds  6b  Motor right leg:  0=No drift, limb holds 90 (or 45) degrees for full 10 seconds  7. Limb Ataxia: 0=Absent  8.  Sensory: 0=Normal; no sensory loss  9. Best Language:  0=No aphasia, normal  10. Dysarthria: 0=Normal  11. Extinction and Inattention: 0=No abnormality  12. Distal motor function: 0=Normal   Total:   1   Does not qualify for tPA due to low NIHSS and high probability facial droop is from Bell's Palsy, not CVA.    Clinical Course  Comment By Time  I evaluated the patient in person after she got back from CT scan.  Given the physical exam as documented above and the NIH stroke scale of 1, I believe her signs and symptoms are consistent with Bell's palsy and much less likely to be consistent with CVA.  However, given her age and the slight abnormality appreciated on the CT scan, I will proceed with the specialist on-call neurology evaluation.  I will discuss the results with him.  I asked the nurse to hold off on blood work at this time in favor of proceeding with the tele-consult as blood work will not the likely to determine  the disposition or plan. Loleta Rose, MD 09/02 1944  I discussed the case with the specialist on-call neurologist and she and I both agree with  "99% certainty" that the patient is suffering from Bell's palsy.  Neither of [place] are concerned with the questionable calcification versus hemorrhage on the head CT; the patient's symptoms are not consistent with the finding and it is most likely artifact or calcification.  I discussed with the patient and her husband are impressions and offered additional testing such as MRI but we all agreed that that was not necessary.  She does not want lab work and I agree with that as well.  I will treat her for Bell's palsy and I gave strict return precautions should she develop new or worsening symptoms.  She will follow up with her primary care doctor at the next available opportunity. Loleta Rose, MD 09/02 2145    ____________________________________________  FINAL CLINICAL IMPRESSION(S) / ED DIAGNOSES  Final diagnoses:  Right-sided Bell's palsy     MEDICATIONS GIVEN DURING THIS VISIT:  Medications  predniSONE (DELTASONE) tablet 60 mg (60 mg Oral Given 01/05/16 2144)  valACYclovir (VALTREX) tablet 1,000 mg (1,000 mg Oral Given 01/05/16 2144)     NEW OUTPATIENT MEDICATIONS STARTED DURING THIS VISIT:  Discharge Medication List as of 01/05/2016  9:49 PM    START taking these medications   Details  predniSONE (DELTASONE) 20 MG tablet Take 3 tablets (60 mg total) by mouth daily. Continue for six days., Starting Sat 01/05/2016, Print       Discharge Medication List as of 01/05/2016  9:49 PM    CONTINUE these medications which have CHANGED   Details  valACYclovir (VALTREX) 1000 MG tablet Take 1 tablet (1,000 mg total) by mouth 3 (three) times daily., Starting Sat 01/05/2016, Until Sat 01/12/2016, Print         Note:  This document was prepared using Dragon voice recognition software and may include unintentional dictation errors.    Loleta Rose, MD 01/06/16 0010

## 2016-01-05 NOTE — ED Notes (Addendum)
Dr York CeriseForbach at bedside; per MD, waiting for him to speak with neurologist before drawing blood

## 2016-01-05 NOTE — ED Notes (Signed)
Pt sitting up on stretcher, waiting for neurologist consult

## 2016-01-05 NOTE — ED Notes (Signed)
Discussed patient's symptoms with Dr. Mayford KnifeWilliams,  MD advised to call a code stroke at this time.

## 2016-05-01 DIAGNOSIS — I10 Essential (primary) hypertension: Secondary | ICD-10-CM | POA: Insufficient documentation

## 2016-06-30 NOTE — Progress Notes (Signed)
ANNUAL PREVENTATIVE CARE GYN  ENCOUNTER NOTE  Subjective:       Deborah Miller is a 61 y.o. G0P0 female here for a routine annual gynecologic exam.  Current complaints: 1.  Right breast upper quad- small lump/raised area  2. Hernia abd; tender to palpation; no nausea, vomiting or change in bowel function.   Gynecologic History No LMP recorded. Patient is postmenopausal. Contraception: post menopausal status Last Pap: 2017. Results were: normal Last mammogram: 06/2015 . Results were: normal Last colonoscopy: 2009  Obstetric History OB History  Gravida Para Term Preterm AB Living  0            SAB TAB Ectopic Multiple Live Births                 Obstetric Comments  1st Menstrual Cycle:  Age 61    Past Medical History:  Diagnosis Date  . Acid reflux   . Anxiety   . Arthritis   . Asthma 1957  . Breast microcalcification, mammographic 2014  . Breast screening, unspecified   . Carpal tunnel syndrome   . Depression   . Diabetes mellitus without complication (HCC)   . Fecal smearing   . Headache   . Hemorrhoids 2012  . History of hiatal hernia   . Hypertension   . Pain    chronic lbp,stenosis  . Shortness of breath dyspnea    increased with anxiety  . Sleep apnea    cpap    Past Surgical History:  Procedure Laterality Date  . BACK SURGERY  2010   C 5 & 6 fusion  . BREAST BIOPSY Left 2014   Stereo Bx  . BUNIONECTOMY  1996  . carpal tunnel--left hand  Left 2012  . CHOLECYSTECTOMY  2002  . COLONOSCOPY  2009   Dr. Mechele CollinElliott  . DILATION AND CURETTAGE OF UTERUS  1987  . ganglion cyst removal   1988  . KNEE SURGERY Left   . left breast biopsy  2006  . NASAL SEPTUM SURGERY  1975  . neck injection  2014  . NOSE SURGERY  2001  . tonsilloadenoidectomy  2003  . TOTAL HIP ARTHROPLASTY Right 02/14/2015   Procedure: TOTAL HIP ARTHROPLASTY;  Surgeon: Donato HeinzJames P Hooten, MD;  Location: [facility] ORS;  Service: Orthopedics;  Laterality: Right;  . UPPER GI ENDOSCOPY  2009     Current Outpatient Prescriptions on File Prior to Visit  Medication Sig Dispense Refill  . ADVAIR HFA 115-21 MCG/ACT inhaler Inhale 1-2 puffs into the lungs daily.    Marland Kitchen. albuterol (PROVENTIL HFA;VENTOLIN HFA) 108 (90 BASE) MCG/ACT inhaler Inhale 2 puffs into the lungs 3 (three) times daily as needed for wheezing or shortness of breath.    . ALPRAZolam (XANAX) 0.25 MG tablet Take 1 tablet by mouth daily.    . calcium carbonate (TUMS - DOSED IN MG ELEMENTAL CALCIUM) 500 MG chewable tablet Chew 1 tablet by mouth 2 (two) times daily as needed for indigestion or heartburn.    . Calcium Carbonate-Vitamin D (CALCIUM 600 + D PO) Take 1 tablet by mouth daily.    . cholestyramine light (PREVALITE) 4 GM/DOSE powder Take 1 g by mouth daily as needed.    . docusate sodium (COLACE) 100 MG capsule Take 100 mg by mouth daily as needed for mild constipation.    Marland Kitchen. EPINEPHrine (EPIPEN 2-PAK) 0.3 mg/0.3 mL IJ SOAJ injection Inject into the muscle once. AS NEEDED    . gabapentin (NEURONTIN) 300 MG capsule Take 2 capsules  by mouth 2 (two) times daily.    Marland Kitchen L-Lysine 500 MG CAPS Take 2 capsules by mouth daily.    Marland Kitchen loratadine-pseudoephedrine (CLARITIN-D 12-HOUR) 5-120 MG per tablet Take 1 tablet by mouth daily.    . meloxicam (MOBIC) 7.5 MG tablet Take 1 tablet by mouth 2 (two) times daily.     . Multiple Vitamin (MULTIVITAMIN) tablet Take 1 tablet by mouth daily.    Marland Kitchen omeprazole (PRILOSEC) 20 MG capsule Take 20 mg by mouth daily.    . potassium chloride (K-DUR) 10 MEQ tablet Take 10 mEq by mouth daily.    . predniSONE (DELTASONE) 20 MG tablet Take 3 tablets (60 mg total) by mouth daily. Continue for six days. 18 tablet 0  . sodium chloride (OCEAN) 0.65 % SOLN nasal spray Place 1 spray into both nostrils as needed for congestion.    Marland Kitchen SPIRIVA HANDIHALER 18 MCG inhalation capsule Place 1 capsule into inhaler and inhale daily.    Marland Kitchen torsemide (DEMADEX) 20 MG tablet Take 1 tablet by mouth daily.    . traMADol (ULTRAM)  50 MG tablet Take 1-2 tablets (50-100 mg total) by mouth every 4 (four) hours as needed for moderate pain. 30 tablet 0   No current facility-administered medications on file prior to visit.     Allergies  Allergen Reactions  . Augmentin [Amoxicillin-Pot Clavulanate] Nausea And Vomiting  . Latex     Blisters NOTE: Latex IgE NEGATIVE (<0.10)  . Tape     blisters    Social History   Social History  . Marital status: Married    Spouse name: N/A  . Number of children: N/A  . Years of education: N/A   Occupational History  . Not on file.   Social History Main Topics  . Smoking status: Former Smoker    Packs/day: 1.00    Years: 10.00    Quit date: 01/24/1987  . Smokeless tobacco: Never Used  . Alcohol use Yes     Comment: occ  . Drug use: No  . Sexual activity: No   Other Topics Concern  . Not on file   Social History Narrative  . No narrative on file    Family History  Problem Relation Age of Onset  . Ovarian cancer Maternal Grandmother   . Diabetes Father   . Heart disease Father   . Breast cancer Neg Hx   . Colon cancer Neg Hx     The following portions of the patient's history were reviewed and updated as appropriate: allergies, current medications, past family history, past medical history, past social history, past surgical history and problem list.  Review of Systems Review of Systems  Constitutional: Negative.        No hot flashes  HENT: Negative.   Respiratory: Negative.   Cardiovascular: Negative.   Gastrointestinal: Positive for abdominal pain. Negative for constipation and diarrhea.       Tender in substernal region-ventral hernia  Genitourinary: Negative.        Occasional mild leakage of urine  Musculoskeletal: Negative.   Skin: Negative.   Neurological: Negative.   Endo/Heme/Allergies: Negative.   Psychiatric/Behavioral: Negative.     Objective:   BP (!) 155/75   Pulse (!) 103   Ht 4' 7.5" (1.41 m)   Wt 219 lb 9.6 oz (99.6 kg)    BMI 50.12 kg/m  CONSTITUTIONAL: Well-developed, well-nourished female in no acute distress.  PSYCHIATRIC: Normal mood and affect. Normal behavior. Normal judgment and thought content. NEUROLGIC: Alert  and oriented to person, place, and time. Normal muscle tone coordination. No cranial nerve deficit noted. HENT:  Normocephalic, atraumatic, External right and left ear normal.  EYES: Conjunctivae and EOM are normal. No scleral icterus.  NECK: Normal range of motion, supple, no masses.  Normal thyroid.  SKIN: Skin is warm and dry. No rash noted. Not diaphoretic. No erythema. No pallor. CARDIOVASCULAR: Normal heart rate noted, regular rhythm, no murmur. RESPIRATORY: Clear to auscultation bilaterally. Effort and breath sounds normal, no problems with respiration noted. BREASTS: Symmetric in size. No masses, skin changes, nipple drainage, or lymphadenopathy. Breasts scar left breast 3:00 position, 6 cm, well-healed ABDOMEN: Soft, normal bowel sounds, no distention noted. Mild tenderness substernal area, midline, and region of possible ventral hernia; no rebound or guarding.  BLADDER: Normal PELVIC:  External Genitalia: Normal  BUS: Normal  Vagina: Mild atrophy  Cervix: Normal; no lesions; no cervical motion tenderness  Uterus: Normal; midplane, not enlarged  Adnexa: Normal; nonpalpable nontender  RV: External Exam NormaI, No Rectal Masses and Normal Sphincter tone  MUSCULOSKELETAL: Normal range of motion. No tenderness.  No cyanosis, clubbing, or edema.  2+ distal pulses. LYMPHATIC: No Axillary, Supraclavicular, or Inguinal Adenopathy.    Assessment:   Annual gynecologic examination 61 y.o. Contraception: post menopausal status bmi-50 weight gain, 20 pounds this year Ventral hernia, asymptomatic Menopausal state, asymptomatic History of breast calcifications, stable; needing diagnostic mammogram per radiology protocol Multiple comorbidities including diabetes mellitus, hypertension,  morbid obesity, managed by primary care/internal medicine  Plan:  Pap: Pap Co Test Mammogram: Ordered- needs dx bilateral- rt breast calcifications Stool Guaiac Testing:  Ordered Labs: pcp Routine preventative health maintenance measures emphasized: Exercise/Diet/Weight control, Tobacco Warnings and Alcohol/Substance use risks  Monitor ventral hernia symptoms; return if abdominal pain develops or nausea with vomiting Return to Clinic - 1 Year   [employer], CMA  Herold Harms, MD  Note: This dictation was prepared with Dragon dictation along with smaller phrase technology. Any transcriptional errors that result from this process are unintentional.

## 2016-07-01 ENCOUNTER — Encounter: Payer: Self-pay | Admitting: Obstetrics and Gynecology

## 2016-07-01 ENCOUNTER — Ambulatory Visit (INDEPENDENT_AMBULATORY_CARE_PROVIDER_SITE_OTHER): Payer: BC Managed Care – PPO | Admitting: Obstetrics and Gynecology

## 2016-07-01 VITALS — BP 155/75 | HR 103 | Ht <= 58 in | Wt 219.6 lb

## 2016-07-01 DIAGNOSIS — Z8742 Personal history of other diseases of the female genital tract: Secondary | ICD-10-CM

## 2016-07-01 DIAGNOSIS — Z8262 Family history of osteoporosis: Secondary | ICD-10-CM

## 2016-07-01 DIAGNOSIS — Z1211 Encounter for screening for malignant neoplasm of colon: Secondary | ICD-10-CM

## 2016-07-01 DIAGNOSIS — Z1239 Encounter for other screening for malignant neoplasm of breast: Secondary | ICD-10-CM

## 2016-07-01 DIAGNOSIS — Z01419 Encounter for gynecological examination (general) (routine) without abnormal findings: Secondary | ICD-10-CM

## 2016-07-01 DIAGNOSIS — K439 Ventral hernia without obstruction or gangrene: Secondary | ICD-10-CM

## 2016-07-01 DIAGNOSIS — R921 Mammographic calcification found on diagnostic imaging of breast: Secondary | ICD-10-CM

## 2016-07-01 DIAGNOSIS — N951 Menopausal and female climacteric states: Secondary | ICD-10-CM

## 2016-07-01 DIAGNOSIS — E66813 Obesity, class 3: Secondary | ICD-10-CM

## 2016-07-01 DIAGNOSIS — Z87898 Personal history of other specified conditions: Secondary | ICD-10-CM

## 2016-07-01 DIAGNOSIS — Z1231 Encounter for screening mammogram for malignant neoplasm of breast: Secondary | ICD-10-CM

## 2016-07-01 NOTE — Patient Instructions (Signed)
1. Pap smear is done 2. Diagnostic mammogram is ordered 3. Stool guaiac cards are given for colon cancer screening 4. Continue with healthy eating, exercise, and controlled weight loss 5. Continue with calcium and vitamin D supplementation 6. Return in 1 year for physical   Health Maintenance for Postmenopausal Women Menopause is a normal process in which your reproductive ability comes to an end. This process happens gradually over a span of months to years, usually between the ages of 21 and 10. Menopause is complete when you have missed 12 consecutive menstrual periods. It is important to talk with your health care provider about some of the most common conditions that affect postmenopausal women, such as heart disease, cancer, and bone loss (osteoporosis). Adopting a healthy lifestyle and getting preventive care can help to promote your health and wellness. Those actions can also lower your chances of developing some of these common conditions. What should I know about menopause? During menopause, you may experience a number of symptoms, such as:  Moderate-to-severe hot flashes.  Night sweats.  Decrease in sex drive.  Mood swings.  Headaches.  Tiredness.  Irritability.  Memory problems.  Insomnia. Choosing to treat or not to treat menopausal changes is an individual decision that you make with your health care provider. What should I know about hormone replacement therapy and supplements? Hormone therapy products are effective for treating symptoms that are associated with menopause, such as hot flashes and night sweats. Hormone replacement carries certain risks, especially as you become older. If you are thinking about using estrogen or estrogen with progestin treatments, discuss the benefits and risks with your health care provider. What should I know about heart disease and stroke? Heart disease, heart attack, and stroke become more likely as you age. This may be due, in  part, to the hormonal changes that your body experiences during menopause. These can affect how your body processes dietary fats, triglycerides, and cholesterol. Heart attack and stroke are both medical emergencies. There are many things that you can do to help prevent heart disease and stroke:  Have your blood pressure checked at least every 1-2 years. High blood pressure causes heart disease and increases the risk of stroke.  If you are 57-61 years old, ask your health care provider if you should take aspirin to prevent a heart attack or a stroke.  Do not use any tobacco products, including cigarettes, chewing tobacco, or electronic cigarettes. If you need help quitting, ask your health care provider.  It is important to eat a healthy diet and maintain a healthy weight.  Be sure to include plenty of vegetables, fruits, low-fat dairy products, and lean protein.  Avoid eating foods that are high in solid fats, added sugars, or salt (sodium).  Get regular exercise. This is one of the most important things that you can do for your health.  Try to exercise for at least 150 minutes each week. The type of exercise that you do should increase your heart rate and make you sweat. This is known as moderate-intensity exercise.  Try to do strengthening exercises at least twice each week. Do these in addition to the moderate-intensity exercise.  Know your numbers.Ask your health care provider to check your cholesterol and your blood glucose. Continue to have your blood tested as directed by your health care provider. What should I know about cancer screening? There are several types of cancer. Take the following steps to reduce your risk and to catch any cancer development as early  as possible. Breast Cancer  Practice breast self-awareness.  This means understanding how your breasts normally appear and feel.  It also means doing regular breast self-exams. Let your health care provider know about  any changes, no matter how small.  If you are 63 or older, have a clinician do a breast exam (clinical breast exam or CBE) every year. Depending on your age, family history, and medical history, it may be recommended that you also have a yearly breast X-ray (mammogram).  If you have a family history of breast cancer, talk with your health care provider about genetic screening.  If you are at high risk for breast cancer, talk with your health care provider about having an MRI and a mammogram every year.  Breast cancer (BRCA) gene test is recommended for women who have family members with BRCA-related cancers. Results of the assessment will determine the need for genetic counseling and BRCA1 and for BRCA2 testing. BRCA-related cancers include these types:  Breast. This occurs in males or females.  Ovarian.  Tubal. This may also be called fallopian tube cancer.  Cancer of the abdominal or pelvic lining (peritoneal cancer).  Prostate.  Pancreatic. Cervical, Uterine, and Ovarian Cancer  Your health care provider may recommend that you be screened regularly for cancer of the pelvic organs. These include your ovaries, uterus, and vagina. This screening involves a pelvic exam, which includes checking for microscopic changes to the surface of your cervix (Pap test).  For women ages 21-65, health care providers may recommend a pelvic exam and a Pap test every three years. For women ages 56-65, they may recommend the Pap test and pelvic exam, combined with testing for human papilloma virus (HPV), every five years. Some types of HPV increase your risk of cervical cancer. Testing for HPV may also be done on women of any age who have unclear Pap test results.  Other health care providers may not recommend any screening for nonpregnant women who are considered low risk for pelvic cancer and have no symptoms. Ask your health care provider if a screening pelvic exam is right for you.  If you have had past  treatment for cervical cancer or a condition that could lead to cancer, you need Pap tests and screening for cancer for at least 20 years after your treatment. If Pap tests have been discontinued for you, your risk factors (such as having a new sexual partner) need to be reassessed to determine if you should start having screenings again. Some women have medical problems that increase the chance of getting cervical cancer. In these cases, your health care provider may recommend that you have screening and Pap tests more often.  If you have a family history of uterine cancer or ovarian cancer, talk with your health care provider about genetic screening.  If you have vaginal bleeding after reaching menopause, tell your health care provider.  There are currently no reliable tests available to screen for ovarian cancer. Lung Cancer  Lung cancer screening is recommended for adults 38-20 years old who are at high risk for lung cancer because of a history of smoking. A yearly low-dose CT scan of the lungs is recommended if you:  Currently smoke.  Have a history of at least 30 pack-years of smoking and you currently smoke or have quit within the past 15 years. A pack-year is smoking an average of one pack of cigarettes per day for one year. Yearly screening should:  Continue until it has been 15 years  since you quit.  Stop if you develop a health problem that would prevent you from having lung cancer treatment. Colorectal Cancer  This type of cancer can be detected and can often be prevented.  Routine colorectal cancer screening usually begins at age 84 and continues through age 12.  If you have risk factors for colon cancer, your health care provider may recommend that you be screened at an earlier age.  If you have a family history of colorectal cancer, talk with your health care provider about genetic screening.  Your health care provider may also recommend using home test kits to check for  hidden blood in your stool.  A small camera at the end of a tube can be used to examine your colon directly (sigmoidoscopy or colonoscopy). This is done to check for the earliest forms of colorectal cancer.  Direct examination of the colon should be repeated every 5-10 years until age 51. However, if early forms of precancerous polyps or small growths are found or if you have a family history or genetic risk for colorectal cancer, you may need to be screened more often. Skin Cancer  Check your skin from head to toe regularly.  Monitor any moles. Be sure to tell your health care provider:  About any new moles or changes in moles, especially if there is a change in a mole's shape or color.  If you have a mole that is larger than the size of a pencil eraser.  If any of your family members has a history of skin cancer, especially at a young age, talk with your health care provider about genetic screening.  Always use sunscreen. Apply sunscreen liberally and repeatedly throughout the day.  Whenever you are outside, protect yourself by wearing long sleeves, pants, a wide-brimmed hat, and sunglasses. What should I know about osteoporosis? Osteoporosis is a condition in which bone destruction happens more quickly than new bone creation. After menopause, you may be at an increased risk for osteoporosis. To help prevent osteoporosis or the bone fractures that can happen because of osteoporosis, the following is recommended:  If you are 73-62 years old, get at least 1,000 mg of calcium and at least 600 mg of vitamin D per day.  If you are older than age 80 but younger than age 33, get at least 1,200 mg of calcium and at least 600 mg of vitamin D per day.  If you are older than age 18, get at least 1,200 mg of calcium and at least 800 mg of vitamin D per day. Smoking and excessive alcohol intake increase the risk of osteoporosis. Eat foods that are rich in calcium and vitamin D, and do weight-bearing  exercises several times each week as directed by your health care provider. What should I know about how menopause affects my mental health? Depression may occur at any age, but it is more common as you become older. Common symptoms of depression include:  Low or sad mood.  Changes in sleep patterns.  Changes in appetite or eating patterns.  Feeling an overall lack of motivation or enjoyment of activities that you previously enjoyed.  Frequent crying spells. Talk with your health care provider if you think that you are experiencing depression. What should I know about immunizations? It is important that you get and maintain your immunizations. These include:  Tetanus, diphtheria, and pertussis (Tdap) booster vaccine.  Influenza every year before the flu season begins.  Pneumonia vaccine.  Shingles vaccine. Your health care  provider may also recommend other immunizations. This information is not intended to replace advice given to you by your health care provider. Make sure you discuss any questions you have with your health care provider. Document Released: 06/13/2005 Document Revised: 11/09/2015 Document Reviewed: 01/23/2015 Elsevier Interactive Patient Education  2017 [employer].

## 2016-07-05 LAB — PAP IG AND HPV HIGH-RISK
HPV, high-risk: NEGATIVE
PAP SMEAR COMMENT: 0

## 2016-07-11 LAB — FECAL OCCULT BLOOD, IMMUNOCHEMICAL: FECAL OCCULT BLD: NEGATIVE

## 2016-07-13 ENCOUNTER — Encounter: Payer: Self-pay | Admitting: Obstetrics and Gynecology

## 2016-07-14 NOTE — Telephone Encounter (Signed)
Patient called and stated that she spoke with the surgical office and they need a referral to be placed in order for the appt to be scheduled. Please advise

## 2016-07-15 NOTE — Addendum Note (Signed)
Addended by: Marchelle FolksMILLER, Yuchen Fedor G on: 07/15/2016 04:37 PM   Modules accepted: Orders

## 2016-07-22 ENCOUNTER — Ambulatory Visit
Admission: RE | Admit: 2016-07-22 | Discharge: 2016-07-22 | Disposition: A | Discharge status: Home / Self Care | Payer: BC Managed Care – PPO | Source: Ambulatory Visit | Attending: Obstetrics and Gynecology | Admitting: Obstetrics and Gynecology

## 2016-07-22 ENCOUNTER — Other Ambulatory Visit: Payer: Self-pay | Admitting: Obstetrics and Gynecology

## 2016-07-22 DIAGNOSIS — R921 Mammographic calcification found on diagnostic imaging of breast: Secondary | ICD-10-CM

## 2016-07-22 DIAGNOSIS — Z1239 Encounter for other screening for malignant neoplasm of breast: Secondary | ICD-10-CM

## 2016-07-22 DIAGNOSIS — R920 Mammographic microcalcification found on diagnostic imaging of breast: Secondary | ICD-10-CM | POA: Insufficient documentation

## 2016-07-24 ENCOUNTER — Ambulatory Visit (INDEPENDENT_AMBULATORY_CARE_PROVIDER_SITE_OTHER): Payer: BC Managed Care – PPO | Admitting: General Surgery

## 2016-07-24 ENCOUNTER — Encounter: Payer: Self-pay | Admitting: General Surgery

## 2016-07-24 VITALS — BP 160/88 | HR 104 | Resp 18 | Ht <= 58 in | Wt 215.0 lb

## 2016-07-24 DIAGNOSIS — R194 Change in bowel habit: Secondary | ICD-10-CM

## 2016-07-24 DIAGNOSIS — M6208 Separation of muscle (nontraumatic), other site: Secondary | ICD-10-CM

## 2016-07-24 MED ORDER — POLYETHYLENE GLYCOL 3350 17 GM/SCOOP PO POWD: ORAL | 0 refills | Status: DC

## 2016-07-24 NOTE — Patient Instructions (Addendum)
The patient is aware to call back for any questions or concerns.  Colonoscopy, Adult A colonoscopy is an exam to look at the entire large intestine. During the exam, a lubricated, bendable tube is inserted into the anus and then passed into the rectum, colon, and other parts of the large intestine. A colonoscopy is often done as a part of normal colorectal screening or in response to certain symptoms, such as anemia, persistent diarrhea, abdominal pain, and blood in the stool. The exam can help screen for and diagnose medical problems, including:  Tumors.  Polyps.  Inflammation.  Areas of bleeding. Tell a health care provider about:  Any allergies you have.  All medicines you are taking, including vitamins, herbs, eye drops, creams, and over-the-counter medicines.  Any problems you or family members have had with anesthetic medicines.  Any blood disorders you have.  Any surgeries you have had.  Any medical conditions you have.  Any problems you have had passing stool. What are the risks? Generally, this is a safe procedure. However, problems may occur, including:  Bleeding.  A tear in the intestine.  A reaction to medicines given during the exam.  Infection (rare). What happens before the procedure? Eating and drinking restrictions  Follow instructions from your health care provider about eating and drinking, which may include:  A few days before the procedure - follow a low-fiber diet. Avoid nuts, seeds, dried fruit, raw fruits, and vegetables.  1-3 days before the procedure - follow a clear liquid diet. Drink only clear liquids, such as clear broth or bouillon, black coffee or tea, clear juice, clear soft drinks or sports drinks, gelatin dessert, and popsicles. Avoid any liquids that contain red or purple dye.  On the day of the procedure - do not eat or drink anything during the 2 hours before the procedure, or within the time period that your health care provider  recommends. Bowel prep  If you were prescribed an oral bowel prep to clean out your colon:  Take it as told by your health care provider. Starting the day before your procedure, you will need to drink a large amount of medicated liquid. The liquid will cause you to have multiple loose stools until your stool is almost clear or light green.  If your skin or anus gets irritated from diarrhea, you may use these to relieve the irritation:  Medicated wipes, such as adult wet wipes with aloe and vitamin E.  A skin soothing-product like petroleum jelly.  If you vomit while drinking the bowel prep, take a break for up to 60 minutes and then begin the bowel prep again. If vomiting continues and you cannot take the bowel prep without vomiting, call your health care provider. General instructions   Ask your health care provider about changing or stopping your regular medicines. This is especially important if you are taking diabetes medicines or blood thinners.  Plan to have someone take you home from the hospital or clinic. What happens during the procedure?  An IV tube may be inserted into one of your veins.  You will be given medicine to help you relax (sedative).  To reduce your risk of infection:  Your health care team will wash or sanitize their hands.  Your anal area will be washed with soap.  You will be asked to lie on your side with your knees bent.  Your health care provider will lubricate a long, thin, flexible tube. The tube will have a camera and a   light on the end.  The tube will be inserted into your anus.  The tube will be gently eased through your rectum and colon.  Air will be delivered into your colon to keep it open. You may feel some pressure or cramping.  The camera will be used to take images during the procedure.  A small tissue sample may be removed from your body to be examined under a microscope (biopsy). If any potential problems are found, the tissue will  be sent to a lab for testing.  If small polyps are found, your health care provider may remove them and have them checked for cancer cells.  The tube that was inserted into your anus will be slowly removed. The procedure may vary among health care providers and hospitals. What happens after the procedure?  Your blood pressure, heart rate, breathing rate, and blood oxygen level will be monitored until the medicines you were given have worn off.  Do not drive for 24 hours after the exam.  You may have a small amount of blood in your stool.  You may pass gas and have mild abdominal cramping or bloating due to the air that was used to inflate your colon during the exam.  It is up to you to get the results of your procedure. Ask your health care provider, or the department performing the procedure, when your results will be ready. This information is not intended to replace advice given to you by your health care provider. Make sure you discuss any questions you have with your health care provider. Document Released: 04/18/2000 Document Revised: 02/20/2016 Document Reviewed: 07/03/2015 Elsevier Interactive Patient Education  2017 [employer]vier [employer].  

## 2016-07-24 NOTE — Progress Notes (Signed)
Patient ID: Deborah HalimMaryann C Billingham, female   DOB: 1955-12-12, 61 y.o.   MRN: [id number]  Chief Complaint  Patient presents with  . Hernia    HPI Deborah Miller is a 61 y.o. female.  Here today for evaluation of a possible ventral hernia referred by Dr Greggory Keenefrancesco. She states she noticed an abdominal lump above belly button associated with some pain last December. She states she noticed while leaning back on the toilet. She states she feels better sleeping on the right side. She does admit to increased "gas" and change in bowel habits.The patient is not port a mucus in the stools. No diarrhea. Denies rectal bleeding. Bowels move every 1-3 days.  HPI  Past Medical History:  Diagnosis Date  . Acid reflux   . Anxiety   . Arthritis   . Asthma 1957  . Breast microcalcification, mammographic 2014  . Breast screening, unspecified   . Carpal tunnel syndrome   . Depression   . Diabetes mellitus without complication (HCC)   . Fecal smearing   . Headache   . Hemorrhoids 2012  . History of hiatal hernia   . Hypertension   . Pain    chronic lbp,stenosis  . Shortness of breath dyspnea    increased with anxiety  . Sleep apnea    cpap    Past Surgical History:  Procedure Laterality Date  . BACK SURGERY  2010   C 5 & 6 fusion  . BREAST BIOPSY Left 2014   Stereo Bx  . BUNIONECTOMY  1996  . carpal tunnel--left hand  Left 2012  . CHOLECYSTECTOMY  2002  . COLONOSCOPY  2009   Dr. Mechele CollinElliott  . DILATION AND CURETTAGE OF UTERUS  1987  . ganglion cyst removal   1988  . KNEE SURGERY Left 2014  . left breast biopsy  2006  . NASAL SEPTUM SURGERY  1975  . neck injection  2014  . NOSE SURGERY  2001  . tonsilloadenoidectomy  2003  . TOTAL HIP ARTHROPLASTY Right 02/14/2015   Procedure: TOTAL HIP ARTHROPLASTY;  Surgeon: Donato HeinzJames P Hooten, MD;  Location: [facility] ORS;  Service: Orthopedics;  Laterality: Right;  . UPPER GI ENDOSCOPY  2009, 2015   Dr Mechele CollinElliott / Dr Lemar LivingsByrnett    Family History  Problem Relation  Age of Onset  . Ovarian cancer Maternal Grandmother   . Diabetes Father   . Heart disease Father   . Breast cancer Neg Hx   . Colon cancer Neg Hx     Social History Social History  Substance Use Topics  . Smoking status: Former Smoker    Packs/day: 1.00    Years: 10.00    Quit date: 01/24/1987  . Smokeless tobacco: Never Used  . Alcohol use Yes     Comment: occ    Allergies  Allergen Reactions  . Augmentin [Amoxicillin-Pot Clavulanate] Nausea And Vomiting  . Cefadroxil Other (See Comments)  . Latex     Blisters NOTE: Latex IgE NEGATIVE (<0.10)  . Tape     blisters    Current Outpatient Prescriptions  Medication Sig Dispense Refill  . ADVAIR HFA 115-21 MCG/ACT inhaler Inhale 1-2 puffs into the lungs daily.    Deborah Miller Kitchen. albuterol (PROVENTIL HFA;VENTOLIN HFA) 108 (90 BASE) MCG/ACT inhaler Inhale 2 puffs into the lungs 3 (three) times daily as needed for wheezing or shortness of breath.    . ALPRAZolam (XANAX) 0.25 MG tablet Take 1 tablet by mouth daily.    . calcium carbonate (TUMS - DOSED  IN MG ELEMENTAL CALCIUM) 500 MG chewable tablet Chew 1 tablet by mouth 2 (two) times daily as needed for indigestion or heartburn.    . Calcium Carbonate-Vitamin D (CALCIUM 600 + D PO) Take 1 tablet by mouth daily.    . cholestyramine light (PREVALITE) 4 GM/DOSE powder Take 1 g by mouth daily as needed.    . docusate sodium (COLACE) 100 MG capsule Take 100 mg by mouth daily as needed for mild constipation.    Deborah Miller Kitchen EPINEPHrine (EPIPEN 2-PAK) 0.3 mg/0.3 mL IJ SOAJ injection Inject into the muscle once. AS NEEDED    . gabapentin (NEURONTIN) 300 MG capsule Take 2 capsules by mouth 2 (two) times daily.    Deborah Miller Kitchen L-Lysine 500 MG CAPS Take 2 capsules by mouth daily.    Deborah Miller Kitchen loratadine-pseudoephedrine (CLARITIN-D 12-HOUR) 5-120 MG per tablet Take 1 tablet by mouth daily.    . meloxicam (MOBIC) 7.5 MG tablet Take 1 tablet by mouth 2 (two) times daily.     . Multiple Vitamin (MULTIVITAMIN) tablet Take 1 tablet by mouth  daily.    Deborah Miller Kitchen omeprazole (PRILOSEC) 20 MG capsule Take 20 mg by mouth daily.    . potassium chloride (K-DUR) 10 MEQ tablet Take 10 mEq by mouth daily.    . predniSONE (DELTASONE) 20 MG tablet Take 3 tablets (60 mg total) by mouth daily. Continue for six days. 18 tablet 0  . sodium chloride (OCEAN) 0.65 % SOLN nasal spray Place 1 spray into both nostrils as needed for congestion.    Deborah Miller Kitchen SPIRIVA HANDIHALER 18 MCG inhalation capsule Place 1 capsule into inhaler and inhale daily.    Deborah Miller Kitchen torsemide (DEMADEX) 20 MG tablet Take 1 tablet by mouth daily.    . traMADol (ULTRAM) 50 MG tablet Take 1-2 tablets (50-100 mg total) by mouth every 4 (four) hours as needed for moderate pain. 30 tablet 0  . valACYclovir (VALTREX) 1000 MG tablet Take by mouth.    . polyethylene glycol powder (GLYCOLAX/MIRALAX) powder 255 grams one bottle for colonoscopy prep 255 g 0   No current facility-administered medications for this visit.     Review of Systems Review of Systems  Constitutional: Negative.   Respiratory: Negative.   Cardiovascular: Negative.     Blood pressure (!) 160/88, pulse (!) 104, resp. rate 18, height 4\' 7"  (1.397 m), weight 215 lb (97.5 kg).  Physical Exam Physical Exam  Constitutional: She is oriented to person, place, and time. She appears well-developed and well-nourished.  HENT:  Mouth/Throat: Oropharynx is clear and moist.  Eyes: Conjunctivae are normal. No scleral icterus.  Neck: Neck supple.  Cardiovascular: Normal rate, regular rhythm and normal heart sounds.   Pulmonary/Chest: Effort normal and breath sounds normal.  Abdominal: Soft. Normal appearance. There is no hepatosplenomegaly. There is no tenderness. No hernia.    Diastasis recti present   Lymphadenopathy:    She has no cervical adenopathy.  Neurological: She is alert and oriented to person, place, and time.  Skin: Skin is warm and dry.  Psychiatric: Her behavior is normal.      Assessment    Diastases recti without  evidence of ventral hernia.  Change in stool habits.    Plan    The patient would be a candidate for her typical screening exam next year. In light of her 1 year history of change in stool habits it seems reasonable to move this up. This will not change the total number of exams required over a lifetime.  Colonoscopy with possible biopsy/polypectomy prn: Information regarding the procedure, including its potential risks and complications (including but not limited to perforation of the bowel, which may require emergency surgery to repair, and bleeding) was verbally given to the patient. Educational information regarding lower intestinal endoscopy was given to the patient. Written instructions for how to complete the bowel prep using Miralax were provided. The importance of drinking ample fluids to avoid dehydration as a result of the prep emphasized.  The patient is aware to call back for any questions or new concerns.   Patient has been scheduled for a colonoscopy on 08-20-16 at [facility]. The patient has a long history of constipation and for that reason we'll modify her preparation. The patient will complete a clear liquid prep for two days prior to colonoscopy. She will also take 10 oz. of citrate magnesia 2 days prior to procedure in addition to regular Miralax prep.    This information has been scribed by Dorathy Daft RN, BSN,BC.   Earline Mayotte 07/25/2016, 7:11 AM

## 2016-07-25 DIAGNOSIS — M6208 Separation of muscle (nontraumatic), other site: Secondary | ICD-10-CM | POA: Insufficient documentation

## 2016-07-25 DIAGNOSIS — R194 Change in bowel habit: Secondary | ICD-10-CM | POA: Insufficient documentation

## 2016-07-28 ENCOUNTER — Encounter: Payer: Self-pay | Admitting: General Surgery

## 2016-08-15 ENCOUNTER — Other Ambulatory Visit: Payer: Self-pay | Admitting: General Surgery

## 2016-08-15 DIAGNOSIS — Z1211 Encounter for screening for malignant neoplasm of colon: Secondary | ICD-10-CM

## 2016-08-15 MED ORDER — DEXTROSE 5 % IV SOLN: 600.0000 mg | Freq: Once | INTRAVENOUS | Status: DC

## 2016-08-15 NOTE — Progress Notes (Unsigned)
Discussed need for prophylaxis for this patient < 2 years s/p total hip replacement with her orthopaedist.  He requested a pre procedure dose of clindamycin.

## 2016-08-19 ENCOUNTER — Encounter: Payer: Self-pay | Admitting: *Deleted

## 2016-08-20 ENCOUNTER — Ambulatory Visit
Admission: RE | Admit: 2016-08-20 | Discharge: 2016-08-20 | Disposition: A | Discharge status: Home / Self Care | Payer: BC Managed Care – PPO | Source: Ambulatory Visit | Attending: General Surgery | Admitting: General Surgery

## 2016-08-20 ENCOUNTER — Ambulatory Visit: Payer: BC Managed Care – PPO | Admitting: Anesthesiology

## 2016-08-20 ENCOUNTER — Encounter
Admission: RE | Disposition: A | Discharge status: Home / Self Care | Payer: Self-pay | Source: Ambulatory Visit | Attending: General Surgery

## 2016-08-20 DIAGNOSIS — Z79899 Other long term (current) drug therapy: Secondary | ICD-10-CM | POA: Diagnosis not present

## 2016-08-20 DIAGNOSIS — Z96641 Presence of right artificial hip joint: Secondary | ICD-10-CM | POA: Diagnosis not present

## 2016-08-20 DIAGNOSIS — I10 Essential (primary) hypertension: Secondary | ICD-10-CM | POA: Diagnosis not present

## 2016-08-20 DIAGNOSIS — K5730 Diverticulosis of large intestine without perforation or abscess without bleeding: Secondary | ICD-10-CM | POA: Insufficient documentation

## 2016-08-20 DIAGNOSIS — K219 Gastro-esophageal reflux disease without esophagitis: Secondary | ICD-10-CM | POA: Diagnosis not present

## 2016-08-20 DIAGNOSIS — Z91040 Latex allergy status: Secondary | ICD-10-CM | POA: Insufficient documentation

## 2016-08-20 DIAGNOSIS — Z87891 Personal history of nicotine dependence: Secondary | ICD-10-CM | POA: Diagnosis not present

## 2016-08-20 DIAGNOSIS — Z791 Long term (current) use of non-steroidal anti-inflammatories (NSAID): Secondary | ICD-10-CM | POA: Insufficient documentation

## 2016-08-20 DIAGNOSIS — R194 Change in bowel habit: Secondary | ICD-10-CM | POA: Insufficient documentation

## 2016-08-20 DIAGNOSIS — F418 Other specified anxiety disorders: Secondary | ICD-10-CM | POA: Insufficient documentation

## 2016-08-20 DIAGNOSIS — Z6841 Body Mass Index (BMI) 40.0 and over, adult: Secondary | ICD-10-CM | POA: Insufficient documentation

## 2016-08-20 DIAGNOSIS — E119 Type 2 diabetes mellitus without complications: Secondary | ICD-10-CM | POA: Insufficient documentation

## 2016-08-20 DIAGNOSIS — Z880 Allergy status to penicillin: Secondary | ICD-10-CM | POA: Diagnosis not present

## 2016-08-20 DIAGNOSIS — G4730 Sleep apnea, unspecified: Secondary | ICD-10-CM | POA: Diagnosis not present

## 2016-08-20 HISTORY — PX: COLONOSCOPY WITH PROPOFOL: SHX5780

## 2016-08-20 LAB — GLUCOSE, CAPILLARY: GLUCOSE-CAPILLARY: 124 mg/dL — AB (ref 65–99)

## 2016-08-20 SURGERY — COLONOSCOPY WITH PROPOFOL
Anesthesia: General

## 2016-08-20 MED ORDER — PHENYLEPHRINE HCL 10 MG/ML IJ SOLN
INTRAMUSCULAR | Status: DC | PRN
  Administered 2016-08-20: 100 ug via INTRAVENOUS

## 2016-08-20 MED ORDER — CLINDAMYCIN PHOSPHATE 600 MG/50ML IV SOLN
600.0000 mg | Freq: Once | INTRAVENOUS | Status: AC
  Administered 2016-08-20: 600 mg via INTRAVENOUS
  Filled 2016-08-20: qty 50

## 2016-08-20 MED ORDER — PROPOFOL 500 MG/50ML IV EMUL
INTRAVENOUS | Status: DC | PRN
  Administered 2016-08-20: 150 ug/kg/min via INTRAVENOUS

## 2016-08-20 MED ORDER — PROPOFOL 10 MG/ML IV BOLUS
INTRAVENOUS | Status: DC | PRN
Start: 2016-08-20 — End: 2016-08-20
  Administered 2016-08-20 (×3): 30 mg via INTRAVENOUS

## 2016-08-20 MED ORDER — SODIUM CHLORIDE 0.9 % IV SOLN
INTRAVENOUS | Status: DC
  Administered 2016-08-20: 11:00:00 via INTRAVENOUS

## 2016-08-20 MED ORDER — LACTATED RINGERS IV SOLN
INTRAVENOUS | Status: DC | PRN
  Administered 2016-08-20: 11:00:00 via INTRAVENOUS

## 2016-08-20 MED ORDER — PROPOFOL 500 MG/50ML IV EMUL
INTRAVENOUS | Status: AC
  Filled 2016-08-20: qty 50

## 2016-08-20 NOTE — Anesthesia Preprocedure Evaluation (Signed)
Anesthesia Evaluation  Patient identified by MRN, date of birth, ID band Patient awake    Reviewed: Allergy & Precautions, H&P , NPO status , Patient's Chart, lab work & pertinent test results  History of Anesthesia Complications Negative for: history of anesthetic complications  Airway Mallampati: III  TM Distance: <3 FB Neck ROM: limited    Dental  (+) Poor Dentition, Chipped, Caps, Implants   Pulmonary shortness of breath and with exertion, asthma , sleep apnea , former smoker,    Pulmonary exam normal breath sounds clear to auscultation       Cardiovascular Exercise Tolerance: Poor hypertension, (-) angina(-) Past MI Normal cardiovascular exam Rhythm:regular Rate:Normal     Neuro/Psych  Headaches, PSYCHIATRIC DISORDERS Anxiety Depression  Neuromuscular disease    GI/Hepatic Neg liver ROS, hiatal hernia, GERD  Controlled,  Endo/Other  diabetes, Type obesity  Renal/GU negative Renal ROS  negative genitourinary   Musculoskeletal  (+) Arthritis ,   Abdominal   Peds  Hematology negative hematology ROS (+)   Anesthesia Other Findings Past Medical History: No date: Acid reflux No date: Anxiety No date: Arthritis 1957: Asthma 2014: Breast microcalcification, mammographic No date: Breast screening, unspecified No date: Carpal tunnel syndrome No date: Depression No date: Diabetes mellitus without complication (HCC) No date: Fecal smearing No date: Headache 2012: Hemorrhoids No date: History of hiatal hernia No date: Hypertension No date: Pain     Comment: chronic lbp,stenosis No date: Shortness of breath dyspnea     Comment: increased with anxiety No date: Sleep apnea     Comment: cpap  Past Surgical History: 2010: BACK SURGERY     Comment: C 5 & 6 fusion 2014: BREAST BIOPSY Left     Comment: Stereo Bx 1996: BUNIONECTOMY 2012: carpal tunnel--left hand  Left 2002: CHOLECYSTECTOMY 2009:  COLONOSCOPY     Comment: Dr. Mechele Collin 1987: DILATION AND CURETTAGE OF UTERUS 1988: ganglion cyst removal  No date: JOINT REPLACEMENT     Comment: TOTAL HIP ARTHROPLASTY 2014: KNEE SURGERY Left 2006: left breast biopsy 1975: NASAL SEPTUM SURGERY 2014: neck injection 2001: NOSE SURGERY 2003: tonsilloadenoidectomy 02/14/2015: TOTAL HIP ARTHROPLASTY Right     Comment: Procedure: TOTAL HIP ARTHROPLASTY;  Surgeon:               Donato Heinz, MD;  Location: [facility] ORS;                Service: Orthopedics;  Laterality: Right; 2009, 2015: UPPER GI ENDOSCOPY     Comment: Dr Mechele Collin / Dr Lemar Livings  BMI    Body Mass Index:  47.93 kg/m      Reproductive/Obstetrics negative OB ROS                             Anesthesia Physical Anesthesia Plan  ASA: III  Anesthesia Plan: General   Post-op Pain Management:    Induction:   Airway Management Planned:   Additional Equipment:   Intra-op Plan:   Post-operative Plan:   Informed Consent: I have reviewed the patients History and Physical, chart, labs and discussed the procedure including the risks, benefits and alternatives for the proposed anesthesia with the patient or authorized representative who has indicated his/her understanding and acceptance.   Dental Advisory Given  Plan Discussed with: Anesthesiologist, CRNA and Surgeon  Anesthesia Plan Comments:         Anesthesia Quick Evaluation

## 2016-08-20 NOTE — Transfer of Care (Signed)
Immediate Anesthesia Transfer of Care Note  Patient: Deborah Miller  Procedure(s) Performed: Procedure(s): COLONOSCOPY WITH PROPOFOL (N/A)  Patient Location: PACU  Anesthesia Type:General  Level of Consciousness: sedated  Airway & Oxygen Therapy: Patient Spontanous Breathing  Post-op Assessment: Report given to RN and Post -op Vital signs reviewed and stable  Post vital signs: Reviewed and stable  Last Vitals:  Vitals:   08/20/16 1022  BP: (!) 141/83  Pulse: (!) 107  Resp: 18  Temp: 36.9 C    Last Pain:  Vitals:   08/20/16 1022  TempSrc: Tympanic         Complications: No apparent anesthesia complications

## 2016-08-20 NOTE — Anesthesia Postprocedure Evaluation (Signed)
Anesthesia Post Note  Patient: Deborah Miller  Procedure(s) Performed: Procedure(s) (LRB): COLONOSCOPY WITH PROPOFOL (N/A)  Patient location during evaluation: Endoscopy Anesthesia Type: General Level of consciousness: awake and alert Pain management: pain level controlled Vital Signs Assessment: post-procedure vital signs reviewed and stable Respiratory status: spontaneous breathing, nonlabored ventilation, respiratory function stable and patient connected to nasal cannula oxygen Cardiovascular status: blood pressure returned to baseline and stable Postop Assessment: no signs of nausea or vomiting Anesthetic complications: no     Last Vitals:  Vitals:   08/20/16 1210 08/20/16 1220  BP: 110/70 124/68  Pulse: 81 78  Resp: 16 (!) 22  Temp:      Last Pain:  Vitals:   08/20/16 1150  TempSrc: Tympanic                 Kamaal Cast S

## 2016-08-20 NOTE — Op Note (Signed)
[facility]astroenterology Patient Name: Deborah Miller Procedure Date: 08/20/2016 11:12 AM MRN: [id number] Account #: [id number] Date of Birth: 1956/04/27 Admit Type: Outpatient Age: 61 Room: [facility] ENDO ROOM 1 Gender: Female Note Status: Finalized Procedure:            Colonoscopy Indications:          Change in bowel habits Providers:            Earline Mayotte, MD Medicines:            Monitored Anesthesia Care Complications:        No immediate complications. Procedure:            Pre-Anesthesia Assessment:                       - Prior to the procedure, a History and Physical was                        performed, and patient medications, allergies and                        sensitivities were reviewed. The patient's tolerance of                        previous anesthesia was reviewed.                       - The risks and benefits of the procedure and the                        sedation options and risks were discussed with the                        patient. All questions were answered and informed                        consent was obtained.                       After obtaining informed consent, the colonoscope was                        passed under direct vision. Throughout the procedure,                        the patient's blood pressure, pulse, and oxygen                        saturations were monitored continuously. The                        Colonoscope was introduced through the anus and                        advanced to the the cecum, identified by appendiceal                        orifice and ileocecal valve. The colonoscopy was                        performed without difficulty. The patient tolerated the  procedure well. The quality of the bowel preparation                        was excellent. Findings:      A few medium-mouthed diverticula were found in the sigmoid colon.      The retroflexed view of the distal  rectum and anal verge was normal and       showed no anal or rectal abnormalities. Impression:           - Diverticulosis in the sigmoid colon.                       - The distal rectum and anal verge are normal on                        retroflexion view.                       - No specimens collected. Recommendation:       - Repeat colonoscopy in 10 years for screening purposes.                       - High fiber diet [Duration]. Procedure Code(s):    --- Professional ---                       [phone], Colonoscopy, flexible; diagnostic, including                        collection of specimen(s) by brushing or washing, when                        performed (separate procedure) Diagnosis Code(s):    --- Professional ---                       R19.4, Change in bowel habit                       K57.30, Diverticulosis of large intestine without                        perforation or abscess without bleeding CPT copyright 2016 American Medical Association. All rights reserved. The codes documented in this report are preliminary and upon coder review may  be revised to meet current compliance requirements. Earline Mayotte, MD 08/20/2016 11:45:57 AM This report has been signed electronically. Number of Addenda: 0 Note Initiated On: 08/20/2016 11:12 AM Scope Withdrawal Time: 0 hours 5 minutes 59 seconds  Total Procedure Duration: 0 hours 11 minutes 12 seconds       [facility]

## 2016-08-20 NOTE — H&P (Signed)
No change in clinical exam or history. Had noted a modest change in stool habits.  Planned screening exam for 2019 moved up to this year.

## 2016-08-20 NOTE — Anesthesia Post-op Follow-up Note (Cosign Needed)
Anesthesia QCDR form completed.        

## 2016-08-21 ENCOUNTER — Encounter: Payer: Self-pay | Admitting: General Surgery

## 2016-11-22 IMAGING — CR DG HIP (WITH OR WITHOUT PELVIS) 1V PORT*R*
1 series · 2 of 2 positions shown · non-contrast
Comparison: None.

CLINICAL DATA: Hip replacement.

EXAM:
DG HIP (WITH OR WITHOUT PELVIS) 1V PORT RIGHT

[Series 1: ap · 0.17mm/px · 2 of 2 slices shown]
[im 1/2]
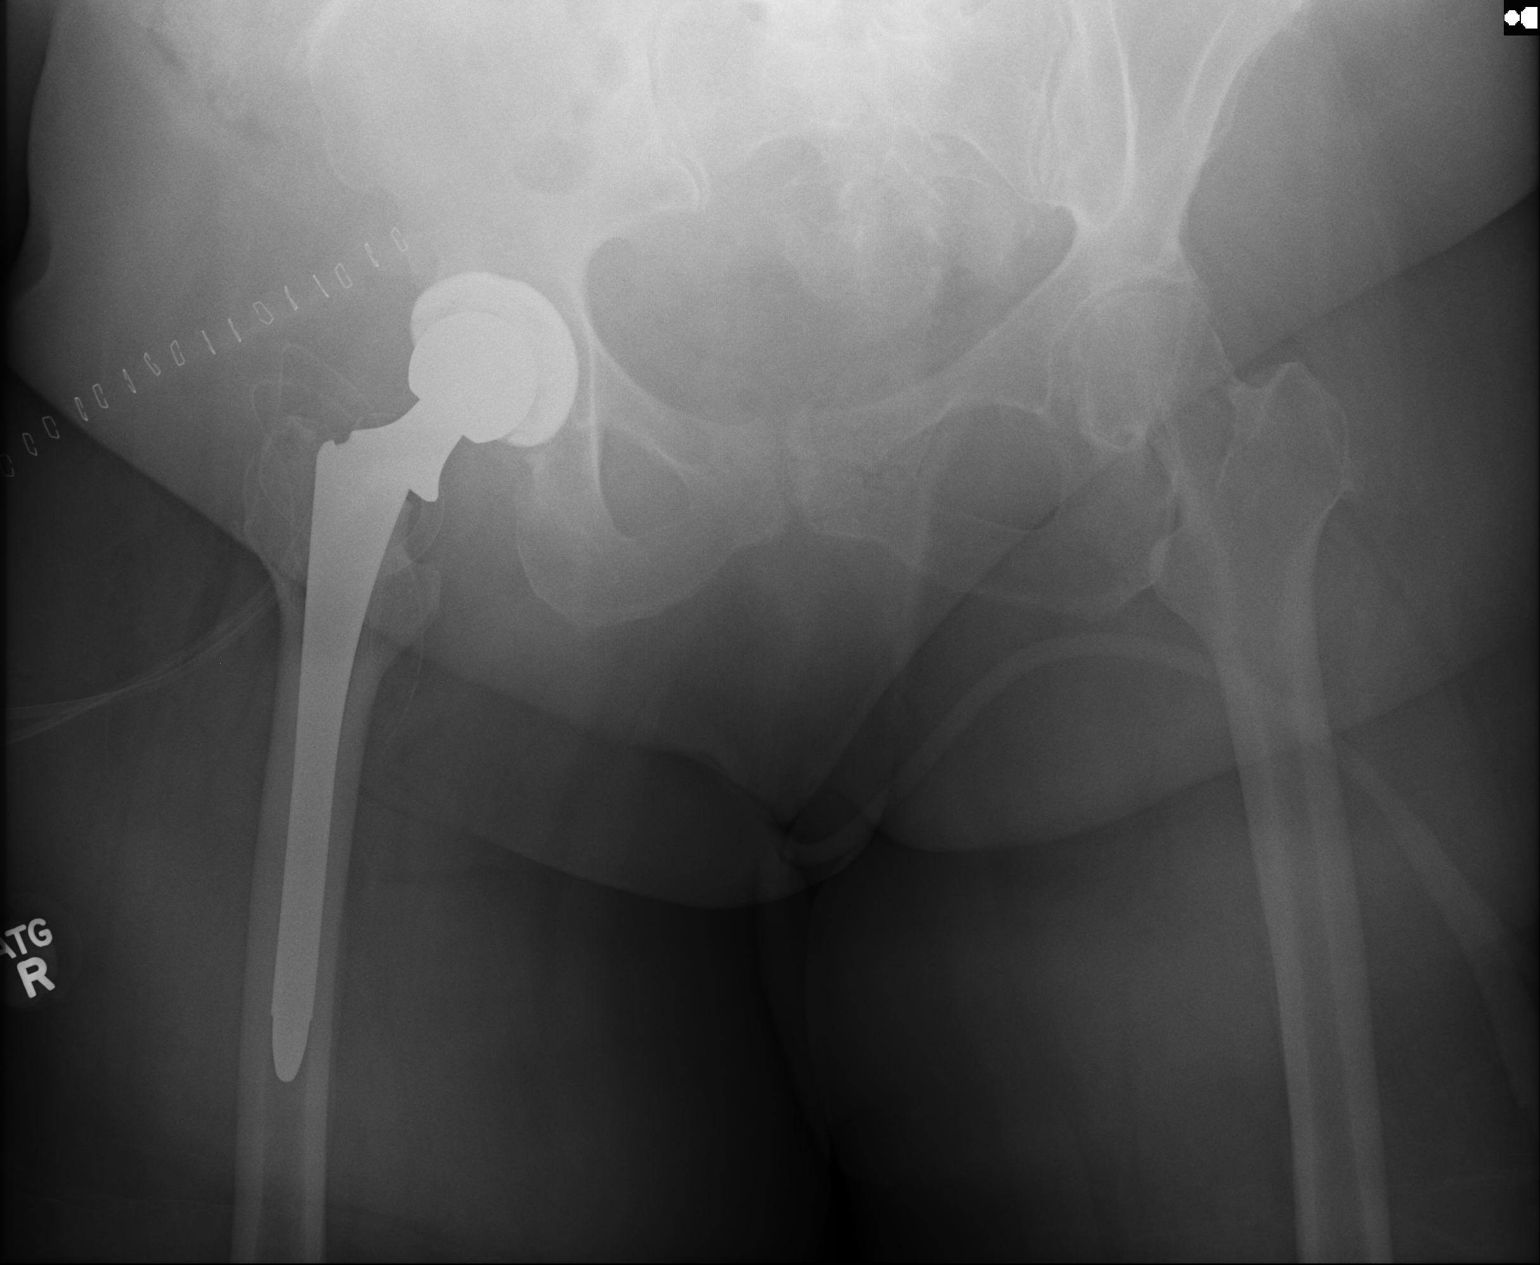
[im 2/2]
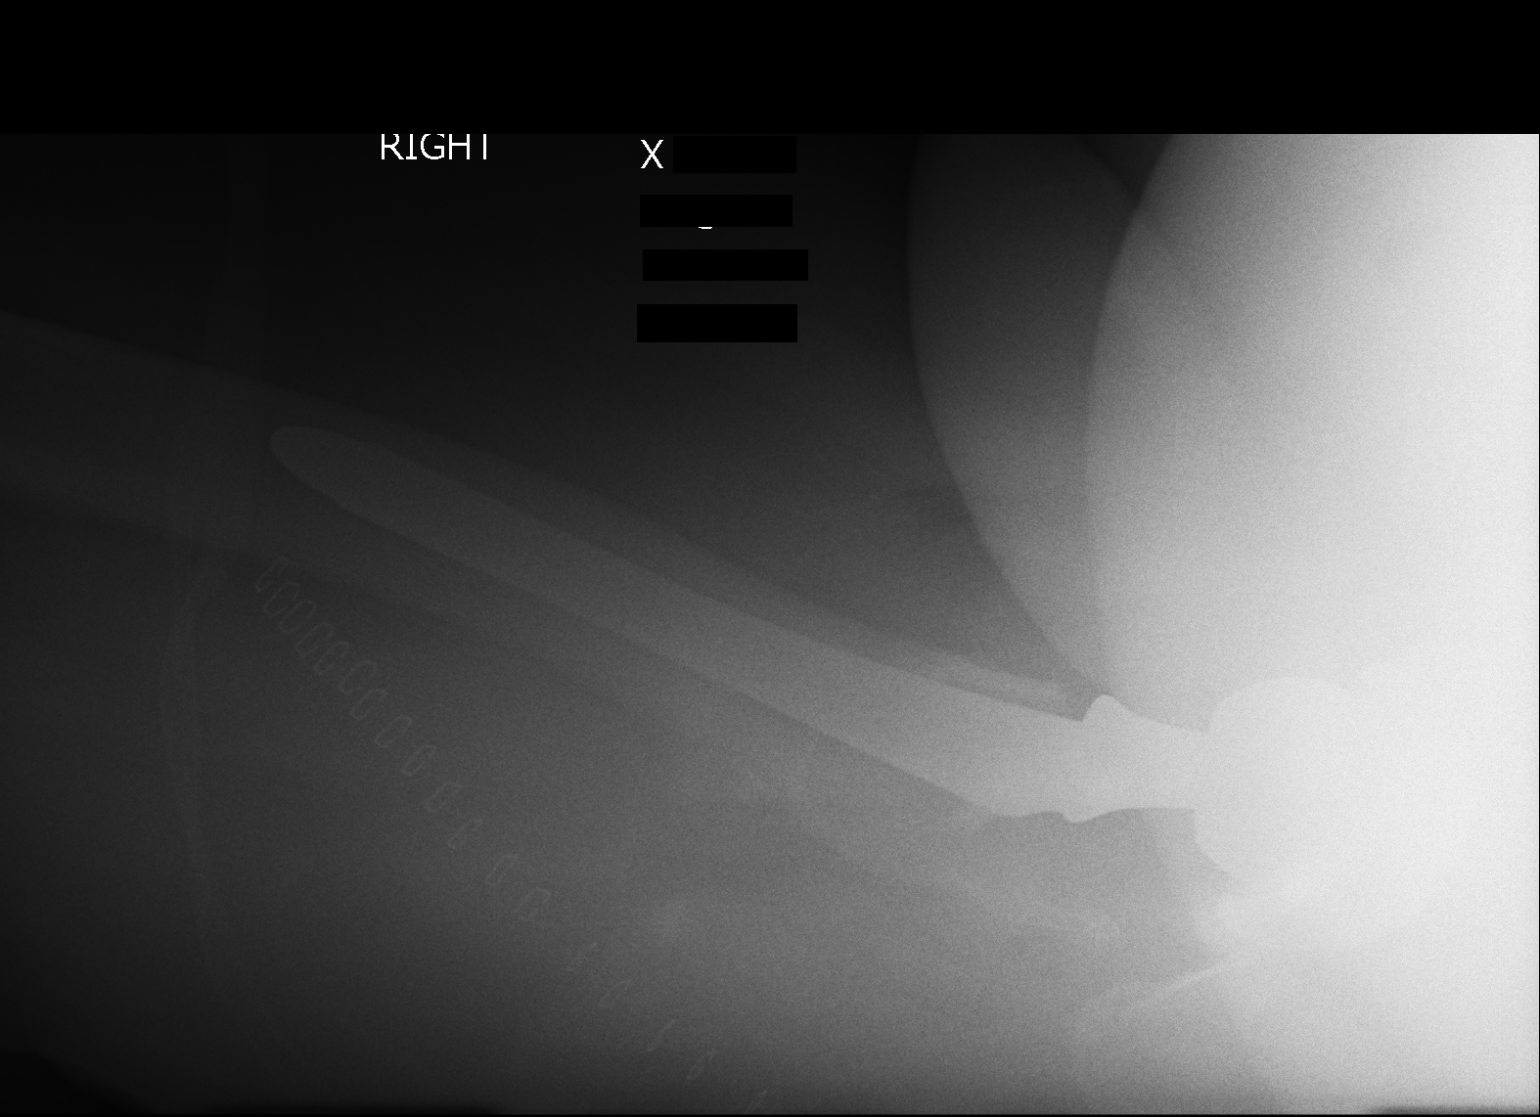

[2 of 2 positions shown; findings below may reference images not displayed]

FINDINGS: Total right hip replacement with good anatomic alignment. Hardware
intact. No acute bony abnormality .
IMPRESSION: Total right hip replacement good anatomic alignment. Hardware intact
.

## 2016-12-29 ENCOUNTER — Other Ambulatory Visit: Payer: Self-pay | Admitting: Physical Medicine and Rehabilitation

## 2016-12-29 DIAGNOSIS — M5412 Radiculopathy, cervical region: Secondary | ICD-10-CM

## 2017-01-13 ENCOUNTER — Ambulatory Visit
Admission: RE | Admit: 2017-01-13 | Discharge: 2017-01-13 | Disposition: A | Discharge status: Home / Self Care | Payer: BC Managed Care – PPO | Source: Ambulatory Visit | Attending: Physical Medicine and Rehabilitation | Admitting: Physical Medicine and Rehabilitation

## 2017-01-13 DIAGNOSIS — M4802 Spinal stenosis, cervical region: Secondary | ICD-10-CM | POA: Insufficient documentation

## 2017-01-13 DIAGNOSIS — M5412 Radiculopathy, cervical region: Secondary | ICD-10-CM | POA: Diagnosis present

## 2017-01-13 DIAGNOSIS — M4803 Spinal stenosis, cervicothoracic region: Secondary | ICD-10-CM | POA: Insufficient documentation

## 2017-01-13 DIAGNOSIS — M4312 Spondylolisthesis, cervical region: Secondary | ICD-10-CM | POA: Diagnosis not present

## 2017-01-13 DIAGNOSIS — M4682 Other specified inflammatory spondylopathies, cervical region: Secondary | ICD-10-CM | POA: Diagnosis not present

## 2017-05-05 DIAGNOSIS — E079 Disorder of thyroid, unspecified: Secondary | ICD-10-CM

## 2017-05-05 DIAGNOSIS — E119 Type 2 diabetes mellitus without complications: Secondary | ICD-10-CM

## 2017-05-05 HISTORY — DX: Disorder of thyroid, unspecified: E07.9

## 2017-05-05 HISTORY — DX: Type 2 diabetes mellitus without complications: E11.9

## 2017-07-05 ENCOUNTER — Encounter: Payer: Self-pay | Admitting: Obstetrics and Gynecology

## 2017-07-07 ENCOUNTER — Encounter: Payer: BC Managed Care – PPO | Admitting: Obstetrics and Gynecology

## 2017-07-08 ENCOUNTER — Encounter: Payer: BC Managed Care – PPO | Admitting: Obstetrics and Gynecology

## 2017-07-16 NOTE — Progress Notes (Deleted)
ANNUAL PREVENTATIVE CARE GYN  ENCOUNTER NOTE  Subjective:       Deborah Miller is a 62 y.o. G0P0 female here for a routine annual gynecologic exam.  Current complaints: 1.   Gynecologic History No LMP recorded. Patient is postmenopausal. Contraception: post menopausal status Last Pap: 07/01/2016 neg/neg  Results were: normal Last mammogram: 07/22/2016 birad 2. Results were: normal Last colonoscopy: 2009  Obstetric History OB History  Gravida Para Term Preterm AB Living  0            SAB TAB Ectopic Multiple Live Births                 Obstetric Comments  1st Menstrual Cycle:  Age 62    Past Medical History:  Diagnosis Date  . Acid reflux   . Anxiety   . Arthritis   . Asthma 1957  . Breast microcalcification, mammographic 2014  . Breast screening, unspecified   . Carpal tunnel syndrome   . Depression   . Diabetes mellitus without complication (HCC)   . Fecal smearing   . Headache   . Hemorrhoids 2012  . History of hiatal hernia   . Hypertension   . Pain    chronic lbp,stenosis  . Shortness of breath dyspnea    increased with anxiety  . Sleep apnea    cpap    Past Surgical History:  Procedure Laterality Date  . BACK SURGERY  2010   C 5 & 6 fusion  . BREAST BIOPSY Left 2014   Stereo Bx  . BUNIONECTOMY  1996  . carpal tunnel--left hand  Left 2012  . CHOLECYSTECTOMY  2002  . COLONOSCOPY  2009   Dr. Mechele CollinElliott  . COLONOSCOPY WITH PROPOFOL N/A 08/20/2016   Procedure: COLONOSCOPY WITH PROPOFOL;  Surgeon: Earline MayotteJeffrey W Byrnett, MD;  [facility];  Service: Endoscopy;  Laterality: N/A;  . DILATION AND CURETTAGE OF UTERUS  1987  . ganglion cyst removal   1988  . JOINT REPLACEMENT     TOTAL HIP ARTHROPLASTY  . KNEE SURGERY Left 2014  . left breast biopsy  2006  . NASAL SEPTUM SURGERY  1975  . neck injection  2014  . NOSE SURGERY  2001  . tonsilloadenoidectomy  2003  . TOTAL HIP ARTHROPLASTY Right 02/14/2015   Procedure: TOTAL HIP ARTHROPLASTY;   Surgeon: Donato HeinzJames P Hooten, MD;  Location: [facility] ORS;  Service: Orthopedics;  Laterality: Right;  . UPPER GI ENDOSCOPY  2009, 2015   Dr Mechele CollinElliott / Dr Lemar LivingsByrnett    Current Outpatient Medications on File Prior to Visit  Medication Sig Dispense Refill  . ADVAIR HFA 115-21 MCG/ACT inhaler Inhale 1-2 puffs into the lungs daily.    Marland Kitchen. albuterol (PROVENTIL HFA;VENTOLIN HFA) 108 (90 BASE) MCG/ACT inhaler Inhale 2 puffs into the lungs 3 (three) times daily as needed for wheezing or shortness of breath.    . ALPRAZolam (XANAX) 0.25 MG tablet Take 1 tablet by mouth daily.    . calcium carbonate (TUMS - DOSED IN MG ELEMENTAL CALCIUM) 500 MG chewable tablet Chew 1 tablet by mouth 2 (two) times daily as needed for indigestion or heartburn.    . Calcium Carbonate-Vitamin D (CALCIUM 600 + D PO) Take 1 tablet by mouth daily.    . cholestyramine light (PREVALITE) 4 GM/DOSE powder Take 1 g by mouth daily as needed.    . docusate sodium (COLACE) 100 MG capsule Take 100 mg by mouth daily as needed for mild constipation.    .Marland Kitchen  EPINEPHrine (EPIPEN 2-PAK) 0.3 mg/0.3 mL IJ SOAJ injection Inject into the muscle once. AS NEEDED    . gabapentin (NEURONTIN) 300 MG capsule Take 2 capsules by mouth 2 (two) times daily.    Marland Kitchen L-Lysine 500 MG CAPS Take 2 capsules by mouth daily.    Marland Kitchen loratadine-pseudoephedrine (CLARITIN-D 12-HOUR) 5-120 MG per tablet Take 1 tablet by mouth daily.    . meloxicam (MOBIC) 7.5 MG tablet Take 1 tablet by mouth 2 (two) times daily.     . Multiple Vitamin (MULTIVITAMIN) tablet Take 1 tablet by mouth daily.    Marland Kitchen omeprazole (PRILOSEC) 20 MG capsule Take 20 mg by mouth daily.    . potassium chloride (K-DUR) 10 MEQ tablet Take 10 mEq by mouth daily.    . predniSONE (DELTASONE) 20 MG tablet Take 3 tablets (60 mg total) by mouth daily. Continue for six days. 18 tablet 0  . sodium chloride (OCEAN) 0.65 % SOLN nasal spray Place 1 spray into both nostrils as needed for congestion.    Marland Kitchen SPIRIVA HANDIHALER 18 MCG  inhalation capsule Place 1 capsule into inhaler and inhale daily.    Marland Kitchen torsemide (DEMADEX) 20 MG tablet Take 1 tablet by mouth daily.    . traMADol (ULTRAM) 50 MG tablet Take 1-2 tablets (50-100 mg total) by mouth every 4 (four) hours as needed for moderate pain. 30 tablet 0  . valACYclovir (VALTREX) 1000 MG tablet Take by mouth.     No current facility-administered medications on file prior to visit.     Allergies  Allergen Reactions  . Augmentin [Amoxicillin-Pot Clavulanate] Nausea And Vomiting  . Cefadroxil Other (See Comments)  . Latex     Blisters NOTE: Latex IgE NEGATIVE (<0.10)  . Tape     blisters    Social History   Socioeconomic History  . Marital status: Married    Spouse name: Not on file  . Number of children: Not on file  . Years of education: Not on file  . Highest education level: Not on file  Social Needs  . Financial resource strain: Not on file  . Food insecurity - worry: Not on file  . Food insecurity - inability: Not on file  . Transportation needs - medical: Not on file  . Transportation needs - non-medical: Not on file  Occupational History  . Not on file  Tobacco Use  . Smoking status: Former Smoker    Packs/day: 1.00    Years: 10.00    Pack years: 10.00    Last attempt to quit: 01/24/1987    Years since quitting: 30.4  . Smokeless tobacco: Never Used  Substance and Sexual Activity  . Alcohol use: Yes    Comment: occ  . Drug use: No  . Sexual activity: No    Birth control/protection: Post-menopausal  Other Topics Concern  . Not on file  Social History Narrative  . Not on file    Family History  Problem Relation Age of Onset  . Ovarian cancer Maternal Grandmother   . Diabetes Father   . Heart disease Father   . Breast cancer Neg Hx   . Colon cancer Neg Hx     The following portions of the patient's history were reviewed and updated as appropriate: allergies, current medications, past family history, past medical history, past  social history, past surgical history and problem list.  Review of Systems Review of Systems  Constitutional: Negative.        No hot flashes  HENT: Negative.  Respiratory: Negative.   Cardiovascular: Negative.   Gastrointestinal: Positive for abdominal pain. Negative for constipation and diarrhea.       Tender in substernal region-ventral hernia  Genitourinary: Negative.        Occasional mild leakage of urine  Musculoskeletal: Negative.   Skin: Negative.   Neurological: Negative.   Endo/Heme/Allergies: Negative.   Psychiatric/Behavioral: Negative.     Objective:   There were no vitals taken for this visit. CONSTITUTIONAL: Well-developed, well-nourished female in no acute distress.  PSYCHIATRIC: Normal mood and affect. Normal behavior. Normal judgment and thought content. NEUROLGIC: Alert and oriented to person, place, and time. Normal muscle tone coordination. No cranial nerve deficit noted. HENT:  Normocephalic, atraumatic, External right and left ear normal.  EYES: Conjunctivae and EOM are normal. No scleral icterus.  NECK: Normal range of motion, supple, no masses.  Normal thyroid.  SKIN: Skin is warm and dry. No rash noted. Not diaphoretic. No erythema. No pallor. CARDIOVASCULAR: Normal heart rate noted, regular rhythm, no murmur. RESPIRATORY: Clear to auscultation bilaterally. Effort and breath sounds normal, no problems with respiration noted. BREASTS: Symmetric in size. No masses, skin changes, nipple drainage, or lymphadenopathy. Breasts scar left breast 3:00 position, 6 cm, well-healed ABDOMEN: Soft, normal bowel sounds, no distention noted. Mild tenderness substernal area, midline, and region of possible ventral hernia; no rebound or guarding.  BLADDER: Normal PELVIC:  External Genitalia: Normal  BUS: Normal  Vagina: Mild atrophy  Cervix: Normal; no lesions; no cervical motion tenderness  Uterus: Normal; midplane, not enlarged  Adnexa: Normal; nonpalpable  nontender  RV: External Exam NormaI, No Rectal Masses and Normal Sphincter tone  MUSCULOSKELETAL: Normal range of motion. No tenderness.  No cyanosis, clubbing, or edema.  2+ distal pulses. LYMPHATIC: No Axillary, Supraclavicular, or Inguinal Adenopathy.    Assessment:   Annual gynecologic examination 62 y.o. Contraception: post menopausal status bmi- Ventral hernia, asymptomatic Menopausal state, asymptomatic Multiple comorbidities including diabetes mellitus, hypertension, morbid obesity, managed by primary care/internal medicine  Plan:  Pap: Due 2021 Mammogram: Ordered-  Stool Guaiac Testing:  Ordered Labs: pcp Routine preventative health maintenance measures emphasized: Exercise/Diet/Weight control, Tobacco Warnings and Alcohol/Substance use risks  Monitor ventral hernia symptoms; return if abdominal pain develops or nausea with vomiting Return to Clinic - 1 Year   Darol Destine, [place]   Note: This dictation was prepared with Dragon dictation along with smaller phrase technology. Any transcriptional errors that result from this process are unintentional.

## 2017-07-17 ENCOUNTER — Encounter: Payer: BC Managed Care – PPO | Admitting: Obstetrics and Gynecology

## 2017-07-22 ENCOUNTER — Encounter: Payer: BC Managed Care – PPO | Admitting: Obstetrics and Gynecology

## 2017-07-31 NOTE — Progress Notes (Deleted)
ANNUAL PREVENTATIVE CARE GYN  ENCOUNTER NOTE  Subjective:       Deborah Miller is a 62 y.o. G0P0 female here for a routine annual gynecologic exam.  Current complaints: 1.   Gynecologic History No LMP recorded. Patient is postmenopausal. Contraception: post menopausal status Last Pap: 07/01/2016 neg/neg  Results were: normal Last mammogram: 07/22/2016 birad 2. Results were: normal Last colonoscopy: 2009  Obstetric History OB History  Gravida Para Term Preterm AB Living  0            SAB TAB Ectopic Multiple Live Births             Obstetric Comments  1st Menstrual Cycle:  Age 62    Past Medical History:  Diagnosis Date  . Acid reflux   . Anxiety   . Arthritis   . Asthma 1957  . Breast microcalcification, mammographic 2014  . Breast screening, unspecified   . Carpal tunnel syndrome   . Depression   . Diabetes mellitus without complication (HCC)   . Fecal smearing   . Headache   . Hemorrhoids 2012  . History of hiatal hernia   . Hypertension   . Pain    chronic lbp,stenosis  . Shortness of breath dyspnea    increased with anxiety  . Sleep apnea    cpap    Past Surgical History:  Procedure Laterality Date  . BACK SURGERY  2010   C 5 & 6 fusion  . BREAST BIOPSY Left 2014   Stereo Bx  . BUNIONECTOMY  1996  . carpal tunnel--left hand  Left 2012  . CHOLECYSTECTOMY  2002  . COLONOSCOPY  2009   Dr. Mechele CollinElliott  . COLONOSCOPY WITH PROPOFOL N/A 08/20/2016   Procedure: COLONOSCOPY WITH PROPOFOL;  Surgeon: Earline MayotteJeffrey W Byrnett, MD;  [facility];  Service: Endoscopy;  Laterality: N/A;  . DILATION AND CURETTAGE OF UTERUS  1987  . ganglion cyst removal   1988  . JOINT REPLACEMENT     TOTAL HIP ARTHROPLASTY  . KNEE SURGERY Left 2014  . left breast biopsy  2006  . NASAL SEPTUM SURGERY  1975  . neck injection  2014  . NOSE SURGERY  2001  . tonsilloadenoidectomy  2003  . TOTAL HIP ARTHROPLASTY Right 02/14/2015   Procedure: TOTAL HIP ARTHROPLASTY;  Surgeon:  Donato HeinzJames P Hooten, MD;  Location: [facility] ORS;  Service: Orthopedics;  Laterality: Right;  . UPPER GI ENDOSCOPY  2009, 2015   Dr Mechele CollinElliott / Dr Lemar LivingsByrnett    Current Outpatient Medications on File Prior to Visit  Medication Sig Dispense Refill  . ADVAIR HFA 115-21 MCG/ACT inhaler Inhale 1-2 puffs into the lungs daily.    Marland Kitchen. albuterol (PROVENTIL HFA;VENTOLIN HFA) 108 (90 BASE) MCG/ACT inhaler Inhale 2 puffs into the lungs 3 (three) times daily as needed for wheezing or shortness of breath.    . ALPRAZolam (XANAX) 0.25 MG tablet Take 1 tablet by mouth daily.    . calcium carbonate (TUMS - DOSED IN MG ELEMENTAL CALCIUM) 500 MG chewable tablet Chew 1 tablet by mouth 2 (two) times daily as needed for indigestion or heartburn.    . Calcium Carbonate-Vitamin D (CALCIUM 600 + D PO) Take 1 tablet by mouth daily.    . cholestyramine light (PREVALITE) 4 GM/DOSE powder Take 1 g by mouth daily as needed.    . docusate sodium (COLACE) 100 MG capsule Take 100 mg by mouth daily as needed for mild constipation.    Marland Kitchen. EPINEPHrine (EPIPEN 2-PAK) 0.3  mg/0.3 mL IJ SOAJ injection Inject into the muscle once. AS NEEDED    . gabapentin (NEURONTIN) 300 MG capsule Take 2 capsules by mouth 2 (two) times daily.    Marland Kitchen L-Lysine 500 MG CAPS Take 2 capsules by mouth daily.    Marland Kitchen loratadine-pseudoephedrine (CLARITIN-D 12-HOUR) 5-120 MG per tablet Take 1 tablet by mouth daily.    . meloxicam (MOBIC) 7.5 MG tablet Take 1 tablet by mouth 2 (two) times daily.     . Multiple Vitamin (MULTIVITAMIN) tablet Take 1 tablet by mouth daily.    Marland Kitchen omeprazole (PRILOSEC) 20 MG capsule Take 20 mg by mouth daily.    . potassium chloride (K-DUR) 10 MEQ tablet Take 10 mEq by mouth daily.    . predniSONE (DELTASONE) 20 MG tablet Take 3 tablets (60 mg total) by mouth daily. Continue for six days. 18 tablet 0  . sodium chloride (OCEAN) 0.65 % SOLN nasal spray Place 1 spray into both nostrils as needed for congestion.    Marland Kitchen SPIRIVA HANDIHALER 18 MCG inhalation  capsule Place 1 capsule into inhaler and inhale daily.    Marland Kitchen torsemide (DEMADEX) 20 MG tablet Take 1 tablet by mouth daily.    . traMADol (ULTRAM) 50 MG tablet Take 1-2 tablets (50-100 mg total) by mouth every 4 (four) hours as needed for moderate pain. 30 tablet 0  . valACYclovir (VALTREX) 1000 MG tablet Take by mouth.     No current facility-administered medications on file prior to visit.     Allergies  Allergen Reactions  . Augmentin [Amoxicillin-Pot Clavulanate] Nausea And Vomiting  . Cefadroxil Other (See Comments)  . Latex     Blisters NOTE: Latex IgE NEGATIVE (<0.10)  . Tape     blisters    Social History   Socioeconomic History  . Marital status: Married    Spouse name: Not on file  . Number of children: Not on file  . Years of education: Not on file  . Highest education level: Not on file  Occupational History  . Not on file  Social Needs  . Financial resource strain: Not on file  . Food insecurity:    Worry: Not on file    Inability: Not on file  . Transportation needs:    Medical: Not on file    Non-medical: Not on file  Tobacco Use  . Smoking status: Former Smoker    Packs/day: 1.00    Years: 10.00    Pack years: 10.00    Last attempt to quit: 01/24/1987    Years since quitting: 30.5  . Smokeless tobacco: Never Used  Substance and Sexual Activity  . Alcohol use: Yes    Comment: occ  . Drug use: No  . Sexual activity: Never    Birth control/protection: Post-menopausal  Lifestyle  . Physical activity:    Days per week: Not on file    Minutes per session: Not on file  . Stress: Not on file  Relationships  . Social connections:    Talks on phone: Not on file    Gets together: Not on file    Attends religious service: Not on file    Active member of club or organization: Not on file    Attends meetings of clubs or organizations: Not on file    Relationship status: Not on file  . Intimate partner violence:    Fear of current or ex partner: Not on  file    Emotionally abused: Not on file  Physically abused: Not on file    Forced sexual activity: Not on file  Other Topics Concern  . Not on file  Social History Narrative  . Not on file    Family History  Problem Relation Age of Onset  . Ovarian cancer Maternal Grandmother   . Diabetes Father   . Heart disease Father   . Breast cancer Neg Hx   . Colon cancer Neg Hx     The following portions of the patient's history were reviewed and updated as appropriate: allergies, current medications, past family history, past medical history, past social history, past surgical history and problem list.  Review of Systems   Objective:   There were no vitals taken for this visit. CONSTITUTIONAL: Well-developed, well-nourished female in no acute distress.  PSYCHIATRIC: Normal mood and affect. Normal behavior. Normal judgment and thought content. NEUROLGIC: Alert and oriented to person, place, and time. Normal muscle tone coordination. No cranial nerve deficit noted. HENT:  Normocephalic, atraumatic, External right and left ear normal.  EYES: Conjunctivae and EOM are normal. No scleral icterus.  NECK: Normal range of motion, supple, no masses.  Normal thyroid.  SKIN: Skin is warm and dry. No rash noted. Not diaphoretic. No erythema. No pallor. CARDIOVASCULAR: Normal heart rate noted, regular rhythm, no murmur. RESPIRATORY: Clear to auscultation bilaterally. Effort and breath sounds normal, no problems with respiration noted. BREASTS: Symmetric in size. No masses, skin changes, nipple drainage, or lymphadenopathy. Breasts scar left breast 3:00 position, 6 cm, well-healed ABDOMEN: Soft, normal bowel sounds, no distention noted. Mild tenderness substernal area, midline, and region of possible ventral hernia; no rebound or guarding.  BLADDER: Normal PELVIC:  External Genitalia: Normal  BUS: Normal  Vagina: Mild atrophy  Cervix: Normal; no lesions; no cervical motion tenderness  Uterus:  Normal; midplane, not enlarged  Adnexa: Normal; nonpalpable nontender  RV: External Exam NormaI, No Rectal Masses and Normal Sphincter tone  MUSCULOSKELETAL: Normal range of motion. No tenderness.  No cyanosis, clubbing, or edema.  2+ distal pulses. LYMPHATIC: No Axillary, Supraclavicular, or Inguinal Adenopathy.    Assessment:   Annual gynecologic examination 62 y.o. Contraception: post menopausal status bmi- Ventral hernia, asymptomatic Menopausal state, asymptomatic Multiple comorbidities including diabetes mellitus, hypertension, morbid obesity, managed by primary care/internal medicine  Plan:  Pap: Due 2021 Mammogram: Ordered-  Stool Guaiac Testing:  Ordered Labs: pcp Routine preventative health maintenance measures emphasized: Exercise/Diet/Weight control, Tobacco Warnings and Alcohol/Substance use risks  Monitor ventral hernia symptoms; return if abdominal pain develops or nausea with vomiting Return to Clinic - 1 Year   Darol Destine, [place]   Note: This dictation was prepared with Dragon dictation along with smaller phrase technology. Any transcriptional errors that result from this process are unintentional.

## 2017-08-06 ENCOUNTER — Encounter: Payer: BC Managed Care – PPO | Admitting: Obstetrics and Gynecology

## 2017-08-12 ENCOUNTER — Encounter: Payer: BC Managed Care – PPO | Admitting: Obstetrics and Gynecology

## 2017-08-22 ENCOUNTER — Encounter: Payer: Self-pay | Admitting: Obstetrics and Gynecology

## 2017-08-24 ENCOUNTER — Other Ambulatory Visit: Payer: Self-pay

## 2017-08-24 DIAGNOSIS — Z1239 Encounter for other screening for malignant neoplasm of breast: Secondary | ICD-10-CM

## 2017-08-27 ENCOUNTER — Ambulatory Visit
Admission: RE | Admit: 2017-08-27 | Discharge: 2017-08-27 | Disposition: A | Discharge status: Home / Self Care | Payer: BC Managed Care – PPO | Source: Ambulatory Visit | Attending: Obstetrics and Gynecology | Admitting: Obstetrics and Gynecology

## 2017-08-27 DIAGNOSIS — Z1231 Encounter for screening mammogram for malignant neoplasm of breast: Secondary | ICD-10-CM | POA: Insufficient documentation

## 2017-08-27 DIAGNOSIS — Z1239 Encounter for other screening for malignant neoplasm of breast: Secondary | ICD-10-CM

## 2017-08-27 NOTE — Progress Notes (Signed)
ANNUAL PREVENTATIVE CARE GYN  ENCOUNTER NOTE  Subjective:       Deborah Miller is a 62 y.o. G0P0 female here for a routine annual gynecologic exam.  Current complaints:  1. Vaginal itching at times; no current symptoms at this time.  Patient is currently being evaluated for an enlarged left submandibular lymph node-Dr. Sarina Ser angle.  She had a sore throat and low-grade temperatures in the recent past but no current symptoms.  She did finish a course of doxycycline. Patient is to have a right knee replacement in June 2019. Bowel function is normal. Bladder function is normal with the exception of occasional urine leakage where she wears a pad; nocturia x1 is noted. Husband has dementia and is in a assisted living center.  Gynecologic History No LMP recorded. Patient is postmenopausal. Contraception: post menopausal status Last Pap: 07/01/2016 neg/neg  Results were: normal Last mammogram: 08/27/2017  birad 1. Results were: normal Last colonoscopy:08/2016- wnl  Obstetric History OB History  Gravida Para Term Preterm AB Living  0            SAB TAB Ectopic Multiple Live Births             Obstetric Comments  1st Menstrual Cycle:  Age 76    Past Medical History:  Diagnosis Date  . Acid reflux   . Anxiety   . Arthritis   . Asthma 1957  . Breast microcalcification, mammographic 2014  . Breast screening, unspecified   . Carpal tunnel syndrome   . Depression   . Diabetes mellitus without complication (HCC)   . Fecal smearing   . Headache   . Hemorrhoids 2012  . History of hiatal hernia   . Hypertension   . Pain    chronic lbp,stenosis  . Shortness of breath dyspnea    increased with anxiety  . Sleep apnea    cpap    Past Surgical History:  Procedure Laterality Date  . BACK SURGERY  2010   C 5 & 6 fusion  . BREAST BIOPSY Left 2014   Stereo Bx  . BUNIONECTOMY  1996  . carpal tunnel--left hand  Left 2012  . CHOLECYSTECTOMY  2002  . COLONOSCOPY  2009   Dr. Mechele Collin   . COLONOSCOPY WITH PROPOFOL N/A 08/20/2016   Procedure: COLONOSCOPY WITH PROPOFOL;  Surgeon: Earline Mayotte, MD;  Location: [facility] ENDOSCOPY;  Service: Endoscopy;  Laterality: N/A;  . DILATION AND CURETTAGE OF UTERUS  1987  . ganglion cyst removal   1988  . JOINT REPLACEMENT     TOTAL HIP ARTHROPLASTY  . KNEE SURGERY Left 2014  . left breast biopsy  2006  . NASAL SEPTUM SURGERY  1975  . neck injection  2014  . NOSE SURGERY  2001  . tonsilloadenoidectomy  2003  . TOTAL HIP ARTHROPLASTY Right 02/14/2015   Procedure: TOTAL HIP ARTHROPLASTY;  Surgeon: Donato Heinz, MD;  Location: [facility] ORS;  Service: Orthopedics;  Laterality: Right;  . UPPER GI ENDOSCOPY  2009, 2015   Dr Mechele Collin / Dr Lemar Livings    Current Outpatient Medications on File Prior to Visit  Medication Sig Dispense Refill  . ADVAIR HFA 115-21 MCG/ACT inhaler Inhale 1-2 puffs into the lungs daily.    Marland Kitchen albuterol (PROVENTIL HFA;VENTOLIN HFA) 108 (90 BASE) MCG/ACT inhaler Inhale 2 puffs into the lungs 3 (three) times daily as needed for wheezing or shortness of breath.    . ALPRAZolam (XANAX) 0.25 MG tablet Take 1 tablet by mouth daily.    Marland Kitchen  calcium carbonate (TUMS - DOSED IN MG ELEMENTAL CALCIUM) 500 MG chewable tablet Chew 1 tablet by mouth 2 (two) times daily as needed for indigestion or heartburn.    . Calcium Carbonate-Vitamin D (CALCIUM 600 + D PO) Take 1 tablet by mouth daily.    . cholestyramine light (PREVALITE) 4 GM/DOSE powder Take 1 g by mouth daily as needed.    . docusate sodium (COLACE) 100 MG capsule Take 100 mg by mouth daily as needed for mild constipation.    Marland Kitchen EPINEPHrine (EPIPEN 2-PAK) 0.3 mg/0.3 mL IJ SOAJ injection Inject into the muscle once. AS NEEDED    . gabapentin (NEURONTIN) 300 MG capsule Take 2 capsules by mouth 2 (two) times daily.    Marland Kitchen L-Lysine 500 MG CAPS Take 2 capsules by mouth daily.    Marland Kitchen loratadine-pseudoephedrine (CLARITIN-D 12-HOUR) 5-120 MG per tablet Take 1 tablet by mouth daily.    .  meloxicam (MOBIC) 7.5 MG tablet Take 1 tablet by mouth 2 (two) times daily.     . Multiple Vitamin (MULTIVITAMIN) tablet Take 1 tablet by mouth daily.    Marland Kitchen omeprazole (PRILOSEC) 20 MG capsule Take 20 mg by mouth daily.    . potassium chloride (K-DUR) 10 MEQ tablet Take 10 mEq by mouth daily.    . predniSONE (DELTASONE) 20 MG tablet Take 3 tablets (60 mg total) by mouth daily. Continue for six days. 18 tablet 0  . sodium chloride (OCEAN) 0.65 % SOLN nasal spray Place 1 spray into both nostrils as needed for congestion.    Marland Kitchen SPIRIVA HANDIHALER 18 MCG inhalation capsule Place 1 capsule into inhaler and inhale daily.    Marland Kitchen torsemide (DEMADEX) 20 MG tablet Take 1 tablet by mouth daily.    . traMADol (ULTRAM) 50 MG tablet Take 1-2 tablets (50-100 mg total) by mouth every 4 (four) hours as needed for moderate pain. 30 tablet 0  . valACYclovir (VALTREX) 1000 MG tablet Take by mouth.     No current facility-administered medications on file prior to visit.     Allergies  Allergen Reactions  . Augmentin [Amoxicillin-Pot Clavulanate] Nausea And Vomiting  . Cefadroxil Other (See Comments)  . Latex     Blisters NOTE: Latex IgE NEGATIVE (<0.10)  . Tape     blisters    Social History   Socioeconomic History  . Marital status: Married    Spouse name: Not on file  . Number of children: Not on file  . Years of education: Not on file  . Highest education level: Not on file  Occupational History  . Not on file  Social Needs  . Financial resource strain: Not on file  . Food insecurity:    Worry: Not on file    Inability: Not on file  . Transportation needs:    Medical: Not on file    Non-medical: Not on file  Tobacco Use  . Smoking status: Former Smoker    Packs/day: 1.00    Years: 10.00    Pack years: 10.00    Last attempt to quit: 01/24/1987    Years since quitting: 30.6  . Smokeless tobacco: Never Used  Substance and Sexual Activity  . Alcohol use: Yes    Comment: occ  . Drug use: No   . Sexual activity: Never    Birth control/protection: Post-menopausal  Lifestyle  . Physical activity:    Days per week: Not on file    Minutes per session: Not on file  . Stress: Not on  file  Relationships  . Social connections:    Talks on phone: Not on file    Gets together: Not on file    Attends religious service: Not on file    Active member of club or organization: Not on file    Attends meetings of clubs or organizations: Not on file    Relationship status: Not on file  . Intimate partner violence:    Fear of current or ex partner: Not on file    Emotionally abused: Not on file    Physically abused: Not on file    Forced sexual activity: Not on file  Other Topics Concern  . Not on file  Social History Narrative  . Not on file    Family History  Problem Relation Age of Onset  . Ovarian cancer Maternal Grandmother   . Diabetes Father   . Heart disease Father   . Breast cancer Neg Hx   . Colon cancer Neg Hx     The following portions of the patient's history were reviewed and updated as appropriate: allergies, current medications, past family history, past medical history, past social history, past surgical history and problem list.  Review of Systems Review of Systems  Constitutional: Positive for weight loss. Negative for chills and fever.       No vasomotor symptoms  HENT: Negative.   Eyes: Negative.   Respiratory: Negative.   Cardiovascular: Negative.   Gastrointestinal: Negative.        Ventral hernia symptoms minimal  Genitourinary:       Occasional urine leakage No significant stress incontinence No urgency  Musculoskeletal: Positive for joint pain.       Scheduled to have right knee replacement  Skin: Negative for rash.       Occasional vulvar itching  Neurological: Negative.   Endo/Heme/Allergies: Negative.   Psychiatric/Behavioral: Negative.      Objective:   BP 125/80   Pulse 85   Ht 4' 7.5" (1.41 m)   Wt 209 lb 8 oz (95 kg)   BMI  47.82 kg/m  CONSTITUTIONAL: Well-developed, well-nourished female in no acute distress.  PSYCHIATRIC: Normal mood and affect. Normal behavior. Normal judgment and thought content. NEUROLGIC: Alert and oriented to person, place, and time. Normal muscle tone coordination. No cranial nerve deficit noted. HENT:  Normocephalic, atraumatic, External right and left ear normal.  EYES: Conjunctivae and EOM are normal. No scleral icterus.  NECK: Normal range of motion, supple, palpable left submandibular lymph node 2 x 1 cm, nontender.  Normal thyroid.  SKIN: Skin is warm and dry. No rash noted. Not diaphoretic. No erythema. No pallor. CARDIOVASCULAR: Normal heart rate noted, regular rhythm, no murmur. RESPIRATORY: Clear to auscultation bilaterally. Effort and breath sounds normal, no problems with respiration noted. BREASTS: Symmetric in size. No masses, skin changes, nipple drainage, or lymphadenopathy. Breasts scar left breast 3:00 position, 6 cm, well-healed ABDOMEN: Soft,  no distention noted. Mild tenderness substernal area, midline, and region of possible ventral hernia; no rebound or guarding.  BLADDER: Normal PELVIC:  External Genitalia: Normal  BUS: Normal  Vagina: Mild atrophy  Cervix: Normal; no lesions; no cervical motion tenderness  Uterus: Normal; midplane, not enlarged, difficult to palpate due to body habitus  Adnexa: Normal; nonpalpable nontender  RV: External Exam NormaI, No Rectal Masses and Normal Sphincter tone  MUSCULOSKELETAL: Normal range of motion. No tenderness.  No cyanosis, clubbing, or edema.  2+ distal pulses. LYMPHATIC: No Axillary, Supraclavicular, or Inguinal Adenopathy.    Assessment:  Annual gynecologic examination 62 y.o. Contraception: post menopausal status [id number] Ventral hernia, asymptomatic Menopausal state, asymptomatic Multiple comorbidities including diabetes mellitus, hypertension, morbid obesity, managed by primary care/internal medicine Left  submandibular lymph node, palpable, undergoing investigation by ENT  Plan:  Pap: Due 2021 Mammogram: utd Stool Guaiac Testing:  Ordered Labs: pcp Routine preventative health maintenance measures emphasized: Exercise/Diet/Weight control, Tobacco Warnings and Alcohol/Substance use risks  Return to Clinic - 1 Year   [employer], CMA  Herold Harms, MD   Note: This dictation was prepared with Dragon dictation along with smaller phrase technology. Any transcriptional errors that result from this process are unintentional.

## 2017-09-01 ENCOUNTER — Ambulatory Visit (INDEPENDENT_AMBULATORY_CARE_PROVIDER_SITE_OTHER): Payer: BC Managed Care – PPO | Admitting: Obstetrics and Gynecology

## 2017-09-01 ENCOUNTER — Encounter: Payer: Self-pay | Admitting: Obstetrics and Gynecology

## 2017-09-01 VITALS — BP 125/80 | HR 85 | Ht <= 58 in | Wt 209.5 lb

## 2017-09-01 DIAGNOSIS — Z1211 Encounter for screening for malignant neoplasm of colon: Secondary | ICD-10-CM

## 2017-09-01 DIAGNOSIS — Z8262 Family history of osteoporosis: Secondary | ICD-10-CM

## 2017-09-01 DIAGNOSIS — Z1239 Encounter for other screening for malignant neoplasm of breast: Secondary | ICD-10-CM

## 2017-09-01 DIAGNOSIS — Z8742 Personal history of other diseases of the female genital tract: Secondary | ICD-10-CM

## 2017-09-01 DIAGNOSIS — Z1231 Encounter for screening mammogram for malignant neoplasm of breast: Secondary | ICD-10-CM

## 2017-09-01 DIAGNOSIS — Z87898 Personal history of other specified conditions: Secondary | ICD-10-CM

## 2017-09-01 DIAGNOSIS — Z01419 Encounter for gynecological examination (general) (routine) without abnormal findings: Secondary | ICD-10-CM

## 2017-09-01 DIAGNOSIS — N951 Menopausal and female climacteric states: Secondary | ICD-10-CM | POA: Diagnosis not present

## 2017-09-01 NOTE — Patient Instructions (Signed)
1.  No Pap smear is done.  Next Pap smear is due 2021 2.  Mammogram already obtained this year 3.  Stool guaiac card testing for colon cancer screening is ordered 4.  Screening labs are done through primary care 5.  Continue with healthy eating and exercise with controlled weight loss.  Weight loss goal is 1 pound per month 6.  Continue with calcium and vitamin D supplementation for osteo-porosis prevention 7.  Return in 1 year for annual exam   Health Maintenance for Postmenopausal Women Menopause is a normal process in which your reproductive ability comes to an end. This process happens gradually over a span of months to years, usually between the ages of 59 and 55. Menopause is complete when you have missed 12 consecutive menstrual periods. It is important to talk with your health care provider about some of the most common conditions that affect postmenopausal women, such as heart disease, cancer, and bone loss (osteoporosis). Adopting a healthy lifestyle and getting preventive care can help to promote your health and wellness. Those actions can also lower your chances of developing some of these common conditions. What should I know about menopause? During menopause, you may experience a number of symptoms, such as:  Moderate-to-severe hot flashes.  Night sweats.  Decrease in sex drive.  Mood swings.  Headaches.  Tiredness.  Irritability.  Memory problems.  Insomnia.  Choosing to treat or not to treat menopausal changes is an individual decision that you make with your health care provider. What should I know about hormone replacement therapy and supplements? Hormone therapy products are effective for treating symptoms that are associated with menopause, such as hot flashes and night sweats. Hormone replacement carries certain risks, especially as you become older. If you are thinking about using estrogen or estrogen with progestin treatments, discuss the benefits and risks  with your health care provider. What should I know about heart disease and stroke? Heart disease, heart attack, and stroke become more likely as you age. This may be due, in part, to the hormonal changes that your body experiences during menopause. These can affect how your body processes dietary fats, triglycerides, and cholesterol. Heart attack and stroke are both medical emergencies. There are many things that you can do to help prevent heart disease and stroke:  Have your blood pressure checked at least every 1-2 years. High blood pressure causes heart disease and increases the risk of stroke.  If you are 26-13 years old, ask your health care provider if you should take aspirin to prevent a heart attack or a stroke.  Do not use any tobacco products, including cigarettes, chewing tobacco, or electronic cigarettes. If you need help quitting, ask your health care provider.  It is important to eat a healthy diet and maintain a healthy weight. ? Be sure to include plenty of vegetables, fruits, low-fat dairy products, and lean protein. ? Avoid eating foods that are high in solid fats, added sugars, or salt (sodium).  Get regular exercise. This is one of the most important things that you can do for your health. ? Try to exercise for at least 150 minutes each week. The type of exercise that you do should increase your heart rate and make you sweat. This is known as moderate-intensity exercise. ? Try to do strengthening exercises at least twice each week. Do these in addition to the moderate-intensity exercise.  Know your numbers.Ask your health care provider to check your cholesterol and your blood glucose. Continue to  to have your blood tested as directed by your health care provider.  What should I know about cancer screening? There are several types of cancer. Take the following steps to reduce your risk and to catch any cancer development as early as possible. Breast Cancer  Practice breast  self-awareness. ? This means understanding how your breasts normally appear and feel. ? It also means doing regular breast self-exams. Let your health care provider know about any changes, no matter how small.  If you are 40 or older, have a clinician do a breast exam (clinical breast exam or CBE) every year. Depending on your age, family history, and medical history, it may be recommended that you also have a yearly breast X-ray (mammogram).  If you have a family history of breast cancer, talk with your health care provider about genetic screening.  If you are at high risk for breast cancer, talk with your health care provider about having an MRI and a mammogram every year.  Breast cancer (BRCA) gene test is recommended for women who have family members with BRCA-related cancers. Results of the assessment will determine the need for genetic counseling and BRCA1 and for BRCA2 testing. BRCA-related cancers include these types: ? Breast. This occurs in males or females. ? Ovarian. ? Tubal. This may also be called fallopian tube cancer. ? Cancer of the abdominal or pelvic lining (peritoneal cancer). ? Prostate. ? Pancreatic.  Cervical, Uterine, and Ovarian Cancer Your health care provider may recommend that you be screened regularly for cancer of the pelvic organs. These include your ovaries, uterus, and vagina. This screening involves a pelvic exam, which includes checking for microscopic changes to the surface of your cervix (Pap test).  For women ages 21-65, health care providers may recommend a pelvic exam and a Pap test every three years. For women ages 30-65, they may recommend the Pap test and pelvic exam, combined with testing for human papilloma virus (HPV), every five years. Some types of HPV increase your risk of cervical cancer. Testing for HPV may also be done on women of any age who have unclear Pap test results.  Other health care providers may not recommend any screening for  nonpregnant women who are considered low risk for pelvic cancer and have no symptoms. Ask your health care provider if a screening pelvic exam is right for you.  If you have had past treatment for cervical cancer or a condition that could lead to cancer, you need Pap tests and screening for cancer for at least 20 years after your treatment. If Pap tests have been discontinued for you, your risk factors (such as having a new sexual partner) need to be reassessed to determine if you should start having screenings again. Some women have medical problems that increase the chance of getting cervical cancer. In these cases, your health care provider may recommend that you have screening and Pap tests more often.  If you have a family history of uterine cancer or ovarian cancer, talk with your health care provider about genetic screening.  If you have vaginal bleeding after reaching menopause, tell your health care provider.  There are currently no reliable tests available to screen for ovarian cancer.  Lung Cancer Lung cancer screening is recommended for adults 55-80 years old who are at high risk for lung cancer because of a history of smoking. A yearly low-dose CT scan of the lungs is recommended if you:  Currently smoke.  Have a history of at least 30   pack-years of smoking and you currently smoke or have quit within the past 15 years. A pack-year is smoking an average of one pack of cigarettes per day for one year.  Yearly screening should:  Continue until it has been 15 years since you quit.  Stop if you develop a health problem that would prevent you from having lung cancer treatment.  Colorectal Cancer  This type of cancer can be detected and can often be prevented.  Routine colorectal cancer screening usually begins at age 50 and continues through age 75.  If you have risk factors for colon cancer, your health care provider may recommend that you be screened at an earlier age.  If you  have a family history of colorectal cancer, talk with your health care provider about genetic screening.  Your health care provider may also recommend using home test kits to check for hidden blood in your stool.  A small camera at the end of a tube can be used to examine your colon directly (sigmoidoscopy or colonoscopy). This is done to check for the earliest forms of colorectal cancer.  Direct examination of the colon should be repeated every 5-10 years until age 75. However, if early forms of precancerous polyps or small growths are found or if you have a family history or genetic risk for colorectal cancer, you may need to be screened more often.  Skin Cancer  Check your skin from head to toe regularly.  Monitor any moles. Be sure to tell your health care provider: ? About any new moles or changes in moles, especially if there is a change in a mole's shape or color. ? If you have a mole that is larger than the size of a pencil eraser.  If any of your family members has a history of skin cancer, especially at a young age, talk with your health care provider about genetic screening.  Always use sunscreen. Apply sunscreen liberally and repeatedly throughout the day.  Whenever you are outside, protect yourself by wearing long sleeves, pants, a wide-brimmed hat, and sunglasses.  What should I know about osteoporosis? Osteoporosis is a condition in which bone destruction happens more quickly than new bone creation. After menopause, you may be at an increased risk for osteoporosis. To help prevent osteoporosis or the bone fractures that can happen because of osteoporosis, the following is recommended:  If you are 19-50 years old, get at least 1,000 mg of calcium and at least 600 mg of vitamin D per day.  If you are older than age 50 but younger than age 70, get at least 1,200 mg of calcium and at least 600 mg of vitamin D per day.  If you are older than age 70, get at least 1,200 mg of  calcium and at least 800 mg of vitamin D per day.  Smoking and excessive alcohol intake increase the risk of osteoporosis. Eat foods that are rich in calcium and vitamin D, and do weight-bearing exercises several times each week as directed by your health care provider. What should I know about how menopause affects my mental health? Depression may occur at any age, but it is more common as you become older. Common symptoms of depression include:  Low or sad mood.  Changes in sleep patterns.  Changes in appetite or eating patterns.  Feeling an overall lack of motivation or enjoyment of activities that you previously enjoyed.  Frequent crying spells.  Talk with your health care provider if you think that   are experiencing depression. What should I know about immunizations? It is important that you get and maintain your immunizations. These include:  Tetanus, diphtheria, and pertussis (Tdap) booster vaccine.  Influenza every year before the flu season begins.  Pneumonia vaccine.  Shingles vaccine.  Your health care provider may also recommend other immunizations. This information is not intended to replace advice given to you by your health care provider. Make sure you discuss any questions you have with your health care provider. Document Released: 06/13/2005 Document Revised: 11/09/2015 Document Reviewed: 01/23/2015 Elsevier Interactive Patient Education  2018 [employer].

## 2017-09-05 LAB — FECAL OCCULT BLOOD, IMMUNOCHEMICAL: Fecal Occult Bld: NEGATIVE

## 2017-09-07 ENCOUNTER — Other Ambulatory Visit: Payer: Self-pay | Admitting: Otolaryngology

## 2017-09-07 DIAGNOSIS — L040 Acute lymphadenitis of face, head and neck: Secondary | ICD-10-CM

## 2017-09-18 ENCOUNTER — Other Ambulatory Visit
Admission: RE | Admit: 2017-09-18 | Discharge: 2017-09-18 | Disposition: A | Discharge status: Home / Self Care | Payer: BC Managed Care – PPO | Source: Ambulatory Visit | Attending: Otolaryngology | Admitting: Otolaryngology

## 2017-09-18 ENCOUNTER — Ambulatory Visit
Admission: RE | Admit: 2017-09-18 | Discharge: 2017-09-18 | Disposition: A | Discharge status: Home / Self Care | Payer: BC Managed Care – PPO | Source: Ambulatory Visit | Attending: Otolaryngology | Admitting: Otolaryngology

## 2017-09-18 DIAGNOSIS — E079 Disorder of thyroid, unspecified: Secondary | ICD-10-CM | POA: Insufficient documentation

## 2017-09-18 DIAGNOSIS — L049 Acute lymphadenitis, unspecified: Secondary | ICD-10-CM | POA: Insufficient documentation

## 2017-09-18 DIAGNOSIS — Z888 Allergy status to other drugs, medicaments and biological substances status: Secondary | ICD-10-CM | POA: Diagnosis not present

## 2017-09-18 DIAGNOSIS — L040 Acute lymphadenitis of face, head and neck: Secondary | ICD-10-CM

## 2017-09-18 LAB — CREATININE, SERUM
Creatinine, Ser: 0.72 mg/dL (ref 0.44–1.00)
GFR calc Af Amer: >60 mL/min (ref >60)
GFR calc non Af Amer: >60 mL/min (ref >60)

## 2017-09-18 MED ORDER — IOPAMIDOL (ISOVUE-370) INJECTION 76%
75.0000 mL | Freq: Once | INTRAVENOUS | Status: AC | PRN
  Administered 2017-09-18: 75 mL via INTRAVENOUS

## 2017-10-14 ENCOUNTER — Other Ambulatory Visit: Payer: Self-pay | Admitting: Otolaryngology

## 2017-10-14 DIAGNOSIS — E042 Nontoxic multinodular goiter: Secondary | ICD-10-CM

## 2017-10-15 ENCOUNTER — Encounter
Admission: RE | Admit: 2017-10-15 | Discharge: 2017-10-15 | Disposition: A | Discharge status: Home / Self Care | Payer: BC Managed Care – PPO | Source: Ambulatory Visit | Attending: Orthopedic Surgery | Admitting: Orthopedic Surgery

## 2017-10-15 ENCOUNTER — Other Ambulatory Visit: Payer: Self-pay

## 2017-10-15 DIAGNOSIS — Z01812 Encounter for preprocedural laboratory examination: Secondary | ICD-10-CM | POA: Diagnosis present

## 2017-10-15 DIAGNOSIS — R9431 Abnormal electrocardiogram [ECG] [EKG]: Secondary | ICD-10-CM | POA: Insufficient documentation

## 2017-10-15 DIAGNOSIS — I10 Essential (primary) hypertension: Secondary | ICD-10-CM | POA: Insufficient documentation

## 2017-10-15 DIAGNOSIS — Z01810 Encounter for preprocedural cardiovascular examination: Secondary | ICD-10-CM | POA: Diagnosis present

## 2017-10-15 HISTORY — DX: Other specified postprocedural states: Z98.890

## 2017-10-15 HISTORY — DX: Cardiac murmur, unspecified: R01.1

## 2017-10-15 HISTORY — DX: Disorder of thyroid, unspecified: E07.9

## 2017-10-15 HISTORY — DX: Other specified postprocedural states: R11.2

## 2017-10-15 LAB — URINALYSIS, ROUTINE W REFLEX MICROSCOPIC
BILIRUBIN URINE: NEGATIVE
Glucose, UA: NEGATIVE mg/dL
Hgb urine dipstick: NEGATIVE
Ketones, ur: NEGATIVE mg/dL
Leukocytes, UA: NEGATIVE
NITRITE: NEGATIVE
PROTEIN: NEGATIVE mg/dL
SPECIFIC GRAVITY, URINE: 1.006 (ref 1.005–1.030)
pH: 5 (ref 5.0–8.0)

## 2017-10-15 LAB — COMPREHENSIVE METABOLIC PANEL
ALBUMIN: 4.4 g/dL (ref 3.5–5.0)
ALK PHOS: 77 U/L (ref 38–126)
ALT: 27 U/L (ref 14–54)
AST: 29 U/L (ref 15–41)
Anion gap: 12 (ref 5–15)
BILIRUBIN TOTAL: 0.8 mg/dL (ref 0.3–1.2)
BUN: 23 mg/dL — AB (ref 6–20)
CALCIUM: 9.2 mg/dL (ref 8.9–10.3)
CO2: 26 mmol/L (ref 22–32)
Chloride: 102 mmol/L (ref 101–111)
Creatinine, Ser: 0.81 mg/dL (ref 0.44–1.00)
GFR calc Af Amer: >60 mL/min (ref >60)
GFR calc non Af Amer: >60 mL/min (ref >60)
GLUCOSE: 145 mg/dL — AB (ref 65–99)
Potassium: 3.6 mmol/L (ref 3.5–5.1)
Sodium: 140 mmol/L (ref 135–145)
TOTAL PROTEIN: 7.9 g/dL (ref 6.5–8.1)

## 2017-10-15 LAB — CBC
HEMATOCRIT: 43.8 % (ref 35.0–47.0)
HEMOGLOBIN: 14.6 g/dL (ref 12.0–16.0)
MCH: 29.3 pg (ref 26.0–34.0)
MCHC: 33.3 g/dL (ref 32.0–36.0)
MCV: 88.2 fL (ref 80.0–100.0)
Platelets: 246 10*3/uL (ref 150–440)
RBC: 4.96 MIL/uL (ref 3.80–5.20)
RDW: 13.8 % (ref 11.5–14.5)
WBC: 10 10*3/uL (ref 3.6–11.0)

## 2017-10-15 LAB — APTT: APTT: 28 s (ref 24–36)

## 2017-10-15 LAB — SURGICAL PCR SCREEN
MRSA, PCR: NEGATIVE
Staphylococcus aureus: NEGATIVE

## 2017-10-15 LAB — C-REACTIVE PROTEIN: CRP: 1.2 mg/dL — AB (ref <1.0)

## 2017-10-15 LAB — SEDIMENTATION RATE: SED RATE: 16 mm/h (ref 0–30)

## 2017-10-15 LAB — HEMOGLOBIN A1C
Hgb A1c MFr Bld: 6.5 % — ABNORMAL HIGH (ref 4.8–5.6)
Mean Plasma Glucose: 139.85 mg/dL

## 2017-10-15 LAB — PROTIME-INR
INR: 0.92
Prothrombin Time: 12.3 seconds (ref 11.4–15.2)

## 2017-10-15 NOTE — Patient Instructions (Signed)
Your procedure is scheduled on: Wednesday, June 26,2019  Report to THE SECOND FLOOR OF THE MEDICAL MALL.Marland Kitchen.  To find out your arrival time please call [phone] between 1PM - 3PM on Tuesday, June 25,2019  Remember: Instructions that are not followed completely may result in serious medical risk,  up to and including death, or upon the discretion of your surgeon and anesthesiologist your surgery may need to be rescheduled.     _X__ 1. Do not eat food after midnight the night before your procedure.                 No gum chewing or hard candies.NOTHING SOLID IN YOUR MOUTH AFTER MIDNIGHT.                  You may drink clear liquids up to 2 hours before you are scheduled to arrive for your surgery-                  DO not drink clear liquids within 2 hours of the start of your surgery.                  Clear Liquids include:  water, apple juice without pulp, clear carbohydrate                 drink such as Clearfast of Gatorade, Black Coffee or Tea (Do not add                 anything to coffee or tea).  __X__2.  On the morning of surgery brush your teeth with toothpaste and water,                     you may rinse your mouth with mouthwash if you wish.                           Do not swallow any toothpaste of mouthwash.     _X__ 3.  No Alcohol for 24 hours before or after surgery.   _X__ 4.  Do Not Smoke or use e-cigarettes For 24 Hours Prior to Your Surgery.                 Do not use any chewable tobacco products for at least 6 hours prior to                 surgery.  ____  5.  Bring all medications with you on the day of surgery if instructed.   ____  6.  Notify your doctor if there is any change in your medical condition      (cold, fever, infections).     Do not wear jewelry, make-up, hairpins, clips or nail polish. Do not wear lotions, powders, or perfumes. You may wear deodorant. Do not shave 48 hours prior to surgery. Men may shave face and  neck. Do not bring valuables to the hospital.    [facility] for any belongings or valuables.  Contacts, dentures or bridgework may not be worn into surgery. Leave your suitcase in the car. After surgery it may be brought to your room. For patients admitted to the hospital, discharge time is determined by your treatment team.   Patients discharged the day of surgery will not be allowed to drive home.   Please read over the following fact sheets that you were given:   PREPARING FOR SURGERY   ____  Take these medicines the morning of surgery with A SIP OF WATER:    1.ADVAIR AND SPIRIVA INHALERS  2. GABAPENTIN  3. LORATADINE  4. NASAL SPRAY  5. OMEPRAZOLE  6. TRAMADOL, IF NEEDED             7. XANAX, IF NEEDED  ____ Fleet Enema (as directed)   _X___ Use CHG Soap as directed  __X__ Use inhalers on the day of surgery. BRING WITH YOU TO THE HOSPITAL  _X___ Stop ALL ASPIRIN PRODUCTS AS OF October 21, 2017  __X__ Stop Anti-inflammatories AS OF October 21, 2017              THIS INCLUDES IBUPROFEN / MOTRIN / ADVIL / ALEVE / MELOXICAM   _X___ Stop supplements until after surgery.              THIS INCLUDES B COMPLEX / CALCIUM AND VITAMIN D / L-LYSINE / MULTIVITAMINS     __X__ Bring C-Pap to the hospital. BRING IT INTO THE HOSPITAL WHEN YOU ARRIVE  YOU MAY CONTINUE TO TAKE:   PREVALITE(as needed) / CORICIDIN / COLACE / CLARITIN D / TUMS AND MELATONIN           DO NOT TAKE ANY OF THESE ON THE MORNING OF SURGERY  CONTINUE TO TAKE TORSEMIDE AND POTASSIUM AS USUAL BUT DO NOT TAKE ON DAY OF SURGERY.  WEAR COMFORTABLE CLOTHES TO THE HOSPITAL. HAVE SAFE AND STURDY SHOES.  ARRANGE TRANSPORTATION  CONTINUE STOOL SOFTENERS AVAILABLE ONCE HOME  BRING YOUR MEDICAL POA/ LIVING WILL TO THE HOSPITAL SO WE CAN PLACE A COPY IN YOUR CHART.

## 2017-10-16 LAB — URINE CULTURE: Special Requests: NORMAL

## 2017-10-16 LAB — TYPE AND SCREEN
ABO/RH(D): O POS
ANTIBODY SCREEN: NEGATIVE

## 2017-10-16 NOTE — Pre-Procedure Instructions (Signed)
CRP and HgbA1C results sent to Dr. Ernest PineHooten for review. DOS 10/28/17.

## 2017-10-17 NOTE — Pre-Procedure Instructions (Signed)
Urine culture results sent to Dr. Ernest PineHooten for review, asked if wanted it recollected as suggested?

## 2017-10-22 ENCOUNTER — Ambulatory Visit
Admission: RE | Admit: 2017-10-22 | Discharge: 2017-10-22 | Disposition: A | Discharge status: Home / Self Care | Payer: BC Managed Care – PPO | Source: Ambulatory Visit | Attending: Otolaryngology | Admitting: Otolaryngology

## 2017-10-22 DIAGNOSIS — E042 Nontoxic multinodular goiter: Secondary | ICD-10-CM | POA: Diagnosis not present

## 2017-10-28 ENCOUNTER — Encounter
Admission: RE | Disposition: A | Discharge status: Skilled Nursing Facility | Payer: Self-pay | Source: Ambulatory Visit | Attending: Orthopedic Surgery

## 2017-10-28 ENCOUNTER — Other Ambulatory Visit: Payer: Self-pay

## 2017-10-28 ENCOUNTER — Encounter: Payer: Self-pay | Admitting: Orthopedic Surgery

## 2017-10-28 ENCOUNTER — Inpatient Hospital Stay: Payer: BC Managed Care – PPO

## 2017-10-28 ENCOUNTER — Inpatient Hospital Stay
Admission: RE | Admit: 2017-10-28 | Discharge: 2017-10-30 | DRG: 470 | Disposition: A | Discharge status: Skilled Nursing Facility | Payer: BC Managed Care – PPO | Source: Ambulatory Visit | Attending: Orthopedic Surgery | Admitting: Orthopedic Surgery

## 2017-10-28 ENCOUNTER — Inpatient Hospital Stay: Payer: BC Managed Care – PPO | Admitting: Anesthesiology

## 2017-10-28 DIAGNOSIS — M25761 Osteophyte, right knee: Secondary | ICD-10-CM | POA: Diagnosis present

## 2017-10-28 DIAGNOSIS — G4733 Obstructive sleep apnea (adult) (pediatric): Secondary | ICD-10-CM | POA: Diagnosis present

## 2017-10-28 DIAGNOSIS — M1711 Unilateral primary osteoarthritis, right knee: Principal | ICD-10-CM | POA: Diagnosis present

## 2017-10-28 DIAGNOSIS — Z6841 Body Mass Index (BMI) 40.0 and over, adult: Secondary | ICD-10-CM

## 2017-10-28 DIAGNOSIS — Z87891 Personal history of nicotine dependence: Secondary | ICD-10-CM | POA: Diagnosis not present

## 2017-10-28 DIAGNOSIS — F418 Other specified anxiety disorders: Secondary | ICD-10-CM | POA: Diagnosis present

## 2017-10-28 DIAGNOSIS — I10 Essential (primary) hypertension: Secondary | ICD-10-CM | POA: Diagnosis present

## 2017-10-28 DIAGNOSIS — I2781 Cor pulmonale (chronic): Secondary | ICD-10-CM | POA: Diagnosis present

## 2017-10-28 DIAGNOSIS — Z96652 Presence of left artificial knee joint: Secondary | ICD-10-CM | POA: Diagnosis present

## 2017-10-28 DIAGNOSIS — K449 Diaphragmatic hernia without obstruction or gangrene: Secondary | ICD-10-CM | POA: Diagnosis present

## 2017-10-28 DIAGNOSIS — K219 Gastro-esophageal reflux disease without esophagitis: Secondary | ICD-10-CM | POA: Diagnosis present

## 2017-10-28 DIAGNOSIS — E119 Type 2 diabetes mellitus without complications: Secondary | ICD-10-CM | POA: Diagnosis present

## 2017-10-28 DIAGNOSIS — Z96659 Presence of unspecified artificial knee joint: Secondary | ICD-10-CM

## 2017-10-28 HISTORY — PX: KNEE ARTHROPLASTY: SHX992

## 2017-10-28 LAB — GLUCOSE, CAPILLARY
GLUCOSE-CAPILLARY: 111 mg/dL — AB (ref 70–99)
GLUCOSE-CAPILLARY: 145 mg/dL — AB (ref 70–99)
GLUCOSE-CAPILLARY: 164 mg/dL — AB (ref 70–99)
GLUCOSE-CAPILLARY: 165 mg/dL — AB (ref 70–99)

## 2017-10-28 SURGERY — ARTHROPLASTY, KNEE, TOTAL, USING IMAGELESS COMPUTER-ASSISTED NAVIGATION
Anesthesia: Spinal | Laterality: Right

## 2017-10-28 MED ORDER — POTASSIUM CHLORIDE CRYS ER 10 MEQ PO TBCR
10.0000 meq | EXTENDED_RELEASE_TABLET | Freq: Every day | ORAL | Status: DC
  Administered 2017-10-28 – 2017-10-30 (×3): 10 meq via ORAL
  Filled 2017-10-28 (×3): qty 1

## 2017-10-28 MED ORDER — TETRACAINE HCL 1 % IJ SOLN
INTRAMUSCULAR | Status: DC | PRN
  Administered 2017-10-28: 4 mg via INTRASPINAL

## 2017-10-28 MED ORDER — ALUM & MAG HYDROXIDE-SIMETH 200-200-20 MG/5ML PO SUSP: 30.0000 mL | ORAL | Status: DC | PRN

## 2017-10-28 MED ORDER — TRANEXAMIC ACID 1000 MG/10ML IV SOLN
1000.0000 mg | INTRAVENOUS | Status: AC
  Administered 2017-10-28: 1000 mg via INTRAVENOUS
  Filled 2017-10-28: qty 10

## 2017-10-28 MED ORDER — DEXAMETHASONE SODIUM PHOSPHATE 10 MG/ML IJ SOLN
INTRAMUSCULAR | Status: AC
  Filled 2017-10-28: qty 1

## 2017-10-28 MED ORDER — TRANEXAMIC ACID 1000 MG/10ML IV SOLN
1000.0000 mg | Freq: Once | INTRAVENOUS | Status: AC
  Administered 2017-10-28: 1000 mg via INTRAVENOUS
  Filled 2017-10-28: qty 1100

## 2017-10-28 MED ORDER — GABAPENTIN 300 MG PO CAPS: 300.0000 mg | ORAL_CAPSULE | Freq: Once | ORAL | Status: DC

## 2017-10-28 MED ORDER — VALACYCLOVIR HCL 500 MG PO TABS
2000.0000 mg | ORAL_TABLET | Freq: Two times a day (BID) | ORAL | Status: DC | PRN
  Filled 2017-10-28: qty 4

## 2017-10-28 MED ORDER — ACETAMINOPHEN 10 MG/ML IV SOLN
1000.0000 mg | Freq: Four times a day (QID) | INTRAVENOUS | Status: AC
  Administered 2017-10-28 – 2017-10-29 (×4): 1000 mg via INTRAVENOUS
  Filled 2017-10-28 (×6): qty 100

## 2017-10-28 MED ORDER — MIDAZOLAM HCL 2 MG/2ML IJ SOLN
INTRAMUSCULAR | Status: AC
  Filled 2017-10-28: qty 2

## 2017-10-28 MED ORDER — TRAMADOL HCL 50 MG PO TABS
50.0000 mg | ORAL_TABLET | ORAL | Status: DC | PRN
  Administered 2017-10-28 – 2017-10-29 (×5): 100 mg via ORAL
  Administered 2017-10-30: 50 mg via ORAL
  Filled 2017-10-28 (×2): qty 2
  Filled 2017-10-28: qty 1
  Filled 2017-10-28 (×3): qty 2

## 2017-10-28 MED ORDER — CHOLESTYRAMINE LIGHT 4 G PO PACK
4.0000 g | PACK | Freq: Every day | ORAL | Status: DC | PRN
  Filled 2017-10-28: qty 1

## 2017-10-28 MED ORDER — SALINE SPRAY 0.65 % NA SOLN
1.0000 | NASAL | Status: DC | PRN
  Filled 2017-10-28: qty 44

## 2017-10-28 MED ORDER — DEXAMETHASONE SODIUM PHOSPHATE 10 MG/ML IJ SOLN
8.0000 mg | Freq: Once | INTRAMUSCULAR | Status: AC
  Administered 2017-10-28: 8 mg via INTRAVENOUS

## 2017-10-28 MED ORDER — BISACODYL 10 MG RE SUPP
10.0000 mg | Freq: Every day | RECTAL | Status: DC | PRN
  Administered 2017-10-30: 10 mg via RECTAL
  Filled 2017-10-28: qty 1

## 2017-10-28 MED ORDER — FAMOTIDINE 20 MG PO TABS
ORAL_TABLET | ORAL | Status: AC
  Filled 2017-10-28: qty 1

## 2017-10-28 MED ORDER — LORATADINE 10 MG PO TABS
10.0000 mg | ORAL_TABLET | Freq: Every day | ORAL | Status: DC
  Administered 2017-10-29 – 2017-10-30 (×2): 10 mg via ORAL
  Filled 2017-10-28 (×2): qty 1

## 2017-10-28 MED ORDER — DIPHENHYDRAMINE HCL 12.5 MG/5ML PO ELIX: 12.5000 mg | ORAL_SOLUTION | ORAL | Status: DC | PRN

## 2017-10-28 MED ORDER — FENTANYL CITRATE (PF) 100 MCG/2ML IJ SOLN
INTRAMUSCULAR | Status: AC
  Administered 2017-10-28: 25 ug via INTRAVENOUS
  Filled 2017-10-28: qty 2

## 2017-10-28 MED ORDER — ACETAMINOPHEN 10 MG/ML IV SOLN
INTRAVENOUS | Status: DC | PRN
  Administered 2017-10-28: 1000 mg via INTRAVENOUS

## 2017-10-28 MED ORDER — CELECOXIB 200 MG PO CAPS
400.0000 mg | ORAL_CAPSULE | Freq: Once | ORAL | Status: AC
  Administered 2017-10-28: 400 mg via ORAL

## 2017-10-28 MED ORDER — POLYVINYL ALCOHOL 1.4 % OP SOLN
1.0000 [drp] | OPHTHALMIC | Status: DC | PRN
  Filled 2017-10-28: qty 15

## 2017-10-28 MED ORDER — FENTANYL CITRATE (PF) 100 MCG/2ML IJ SOLN
25.0000 ug | INTRAMUSCULAR | Status: AC | PRN
  Administered 2017-10-28 (×6): 25 ug via INTRAVENOUS

## 2017-10-28 MED ORDER — ACETAMINOPHEN 325 MG PO TABS: 325.0000 mg | ORAL_TABLET | Freq: Four times a day (QID) | ORAL | Status: DC | PRN

## 2017-10-28 MED ORDER — CHLORHEXIDINE GLUCONATE 4 % EX LIQD: 60.0000 mL | Freq: Once | CUTANEOUS | Status: DC

## 2017-10-28 MED ORDER — MENTHOL 3 MG MT LOZG
1.0000 | LOZENGE | OROMUCOSAL | Status: DC | PRN
  Filled 2017-10-28: qty 9

## 2017-10-28 MED ORDER — PHENOL 1.4 % MT LIQD
1.0000 | OROMUCOSAL | Status: DC | PRN
  Filled 2017-10-28: qty 177

## 2017-10-28 MED ORDER — CELECOXIB 200 MG PO CAPS
ORAL_CAPSULE | ORAL | Status: AC
  Administered 2017-10-28: 400 mg via ORAL
  Filled 2017-10-28: qty 2

## 2017-10-28 MED ORDER — MIDAZOLAM HCL 5 MG/5ML IJ SOLN
INTRAMUSCULAR | Status: DC | PRN
  Administered 2017-10-28: 2 mg via INTRAVENOUS

## 2017-10-28 MED ORDER — METOCLOPRAMIDE HCL 10 MG PO TABS: 5.0000 mg | ORAL_TABLET | Freq: Three times a day (TID) | ORAL | Status: DC | PRN

## 2017-10-28 MED ORDER — OXYCODONE HCL 5 MG PO TABS
10.0000 mg | ORAL_TABLET | ORAL | Status: DC | PRN
  Administered 2017-10-28 – 2017-10-30 (×6): 10 mg via ORAL
  Filled 2017-10-28 (×6): qty 2

## 2017-10-28 MED ORDER — TIOTROPIUM BROMIDE MONOHYDRATE 18 MCG IN CAPS
1.0000 | ORAL_CAPSULE | Freq: Every day | RESPIRATORY_TRACT | Status: DC
  Administered 2017-10-29 – 2017-10-30 (×2): 18 ug via RESPIRATORY_TRACT
  Filled 2017-10-28: qty 5

## 2017-10-28 MED ORDER — METOCLOPRAMIDE HCL 5 MG/ML IJ SOLN: 5.0000 mg | Freq: Three times a day (TID) | INTRAMUSCULAR | Status: DC | PRN

## 2017-10-28 MED ORDER — METOCLOPRAMIDE HCL 10 MG PO TABS
10.0000 mg | ORAL_TABLET | Freq: Three times a day (TID) | ORAL | Status: AC
  Administered 2017-10-28 – 2017-10-30 (×8): 10 mg via ORAL
  Filled 2017-10-28 (×8): qty 1

## 2017-10-28 MED ORDER — TORSEMIDE 20 MG PO TABS
20.0000 mg | ORAL_TABLET | Freq: Every day | ORAL | Status: DC
  Administered 2017-10-29 – 2017-10-30 (×2): 20 mg via ORAL
  Filled 2017-10-28 (×2): qty 1

## 2017-10-28 MED ORDER — BUPIVACAINE HCL (PF) 0.5 % IJ SOLN
INTRAMUSCULAR | Status: AC
  Filled 2017-10-28: qty 10

## 2017-10-28 MED ORDER — OXYCODONE HCL 5 MG PO TABS
5.0000 mg | ORAL_TABLET | ORAL | Status: DC | PRN
Start: 2017-10-28 — End: 2017-10-30
  Administered 2017-10-28 – 2017-10-29 (×2): 5 mg via ORAL
  Filled 2017-10-28 (×2): qty 1

## 2017-10-28 MED ORDER — SENNOSIDES-DOCUSATE SODIUM 8.6-50 MG PO TABS
1.0000 | ORAL_TABLET | Freq: Two times a day (BID) | ORAL | Status: DC
  Administered 2017-10-29 – 2017-10-30 (×3): 1 via ORAL
  Filled 2017-10-28 (×4): qty 1

## 2017-10-28 MED ORDER — MOMETASONE FURO-FORMOTEROL FUM 200-5 MCG/ACT IN AERO
2.0000 | INHALATION_SPRAY | Freq: Two times a day (BID) | RESPIRATORY_TRACT | Status: DC
  Administered 2017-10-28 – 2017-10-30 (×4): 2 via RESPIRATORY_TRACT
  Filled 2017-10-28: qty 8.8

## 2017-10-28 MED ORDER — HYDROMORPHONE HCL 1 MG/ML IJ SOLN: 0.5000 mg | INTRAMUSCULAR | Status: DC | PRN

## 2017-10-28 MED ORDER — ALBUTEROL SULFATE (2.5 MG/3ML) 0.083% IN NEBU: 3.0000 mL | INHALATION_SOLUTION | Freq: Four times a day (QID) | RESPIRATORY_TRACT | Status: DC | PRN

## 2017-10-28 MED ORDER — GABAPENTIN 300 MG PO CAPS
ORAL_CAPSULE | ORAL | Status: AC
  Filled 2017-10-28: qty 1

## 2017-10-28 MED ORDER — INSULIN ASPART 100 UNIT/ML ~~LOC~~ SOLN
0.0000 [IU] | Freq: Three times a day (TID) | SUBCUTANEOUS | Status: DC
  Administered 2017-10-28: 3 [IU] via SUBCUTANEOUS
  Administered 2017-10-30: 2 [IU] via SUBCUTANEOUS
  Filled 2017-10-28 (×2): qty 1

## 2017-10-28 MED ORDER — SODIUM CHLORIDE 0.9 % IV SOLN
INTRAVENOUS | Status: DC
  Administered 2017-10-28: 23:00:00 via INTRAVENOUS

## 2017-10-28 MED ORDER — SODIUM CHLORIDE 0.9 % IV SOLN
INTRAVENOUS | Status: DC
  Administered 2017-10-28 (×2): via INTRAVENOUS

## 2017-10-28 MED ORDER — CLINDAMYCIN PHOSPHATE 900 MG/50ML IV SOLN
INTRAVENOUS | Status: AC
  Filled 2017-10-28: qty 50

## 2017-10-28 MED ORDER — ONDANSETRON HCL 4 MG/2ML IJ SOLN: 4.0000 mg | Freq: Four times a day (QID) | INTRAMUSCULAR | Status: DC | PRN

## 2017-10-28 MED ORDER — PANTOPRAZOLE SODIUM 40 MG PO TBEC
40.0000 mg | DELAYED_RELEASE_TABLET | Freq: Two times a day (BID) | ORAL | Status: DC
  Administered 2017-10-28 – 2017-10-30 (×4): 40 mg via ORAL
  Filled 2017-10-28 (×4): qty 1

## 2017-10-28 MED ORDER — ADULT MULTIVITAMIN W/MINERALS CH
1.0000 | ORAL_TABLET | Freq: Every day | ORAL | Status: DC
  Administered 2017-10-29 – 2017-10-30 (×2): 1 via ORAL
  Filled 2017-10-28 (×3): qty 1

## 2017-10-28 MED ORDER — ONDANSETRON HCL 4 MG/2ML IJ SOLN: 4.0000 mg | Freq: Once | INTRAMUSCULAR | Status: DC | PRN

## 2017-10-28 MED ORDER — GABAPENTIN 300 MG PO CAPS
300.0000 mg | ORAL_CAPSULE | Freq: Two times a day (BID) | ORAL | Status: DC
Start: 2017-10-28 — End: 2017-10-30
  Administered 2017-10-28 – 2017-10-30 (×4): 300 mg via ORAL
  Filled 2017-10-28 (×4): qty 1

## 2017-10-28 MED ORDER — CLINDAMYCIN PHOSPHATE 900 MG/50ML IV SOLN
900.0000 mg | INTRAVENOUS | Status: AC
  Administered 2017-10-28: 900 mg via INTRAVENOUS

## 2017-10-28 MED ORDER — MAGNESIUM HYDROXIDE 400 MG/5ML PO SUSP
30.0000 mL | Freq: Every day | ORAL | Status: DC
  Administered 2017-10-28 – 2017-10-29 (×2): 30 mL via ORAL
  Filled 2017-10-28 (×3): qty 30

## 2017-10-28 MED ORDER — PROPOFOL 10 MG/ML IV BOLUS
INTRAVENOUS | Status: DC | PRN
  Administered 2017-10-28 (×2): 20 mg via INTRAVENOUS

## 2017-10-28 MED ORDER — PHENYLEPHRINE HCL 10 MG/ML IJ SOLN
INTRAMUSCULAR | Status: AC
  Filled 2017-10-28: qty 1

## 2017-10-28 MED ORDER — PROPOFOL 500 MG/50ML IV EMUL
INTRAVENOUS | Status: DC | PRN
  Administered 2017-10-28: 75 ug/kg/min via INTRAVENOUS

## 2017-10-28 MED ORDER — FENTANYL CITRATE (PF) 100 MCG/2ML IJ SOLN
INTRAMUSCULAR | Status: DC | PRN
  Administered 2017-10-28 (×2): 50 ug via INTRAVENOUS

## 2017-10-28 MED ORDER — PROPOFOL 500 MG/50ML IV EMUL
INTRAVENOUS | Status: AC
  Filled 2017-10-28: qty 50

## 2017-10-28 MED ORDER — CLINDAMYCIN PHOSPHATE 600 MG/50ML IV SOLN
600.0000 mg | Freq: Four times a day (QID) | INTRAVENOUS | Status: AC
  Administered 2017-10-28 – 2017-10-29 (×4): 600 mg via INTRAVENOUS
  Filled 2017-10-28 (×4): qty 50

## 2017-10-28 MED ORDER — ENOXAPARIN SODIUM 30 MG/0.3ML ~~LOC~~ SOLN
30.0000 mg | Freq: Two times a day (BID) | SUBCUTANEOUS | Status: DC
  Administered 2017-10-29 – 2017-10-30 (×3): 30 mg via SUBCUTANEOUS
  Filled 2017-10-28 (×3): qty 0.3

## 2017-10-28 MED ORDER — CELECOXIB 200 MG PO CAPS
200.0000 mg | ORAL_CAPSULE | Freq: Two times a day (BID) | ORAL | Status: DC
  Administered 2017-10-28 – 2017-10-30 (×4): 200 mg via ORAL
  Filled 2017-10-28 (×4): qty 1

## 2017-10-28 MED ORDER — FLEET ENEMA 7-19 GM/118ML RE ENEM: 1.0000 | ENEMA | Freq: Once | RECTAL | Status: DC | PRN

## 2017-10-28 MED ORDER — PRAMIPEXOLE DIHYDROCHLORIDE 0.25 MG PO TABS
0.1250 mg | ORAL_TABLET | Freq: Every day | ORAL | Status: DC
  Administered 2017-10-28 – 2017-10-29 (×2): 0.125 mg via ORAL
  Filled 2017-10-28 (×2): qty 1

## 2017-10-28 MED ORDER — SODIUM CHLORIDE 0.9 % IV SOLN
INTRAVENOUS | Status: DC | PRN
  Administered 2017-10-28: 30 ug/min via INTRAVENOUS

## 2017-10-28 MED ORDER — BUPIVACAINE HCL (PF) 0.5 % IJ SOLN
INTRAMUSCULAR | Status: DC | PRN
  Administered 2017-10-28: 2.1 mL

## 2017-10-28 MED ORDER — ACETAMINOPHEN 10 MG/ML IV SOLN
INTRAVENOUS | Status: AC
  Filled 2017-10-28: qty 100

## 2017-10-28 MED ORDER — LIDOCAINE HCL (PF) 2 % IJ SOLN
INTRAMUSCULAR | Status: AC
  Filled 2017-10-28: qty 10

## 2017-10-28 MED ORDER — CALCIUM CARBONATE ANTACID 500 MG PO CHEW: 1.0000 | CHEWABLE_TABLET | Freq: Two times a day (BID) | ORAL | Status: DC | PRN

## 2017-10-28 MED ORDER — FERROUS SULFATE 325 (65 FE) MG PO TABS
325.0000 mg | ORAL_TABLET | Freq: Two times a day (BID) | ORAL | Status: DC
  Administered 2017-10-28 – 2017-10-30 (×4): 325 mg via ORAL
  Filled 2017-10-28 (×4): qty 1

## 2017-10-28 MED ORDER — FENTANYL CITRATE (PF) 100 MCG/2ML IJ SOLN
INTRAMUSCULAR | Status: AC
  Filled 2017-10-28: qty 2

## 2017-10-28 MED ORDER — ONDANSETRON HCL 4 MG PO TABS: 4.0000 mg | ORAL_TABLET | Freq: Four times a day (QID) | ORAL | Status: DC | PRN

## 2017-10-28 MED ORDER — TETRACAINE HCL 1 % IJ SOLN
INTRAMUSCULAR | Status: AC
  Filled 2017-10-28: qty 2

## 2017-10-28 MED ORDER — DEXTROMETHORPHAN-GUAIFENESIN 10-100 MG/5ML PO LIQD: 10.0000 mL | ORAL | Status: DC | PRN

## 2017-10-28 MED ORDER — GLYCOPYRROLATE 0.2 MG/ML IJ SOLN
INTRAMUSCULAR | Status: AC
  Filled 2017-10-28: qty 1

## 2017-10-28 MED ORDER — CALCIUM CARBONATE-VITAMIN D 500-200 MG-UNIT PO TABS
1.0000 | ORAL_TABLET | Freq: Every day | ORAL | Status: DC
  Administered 2017-10-29 – 2017-10-30 (×2): 1 via ORAL
  Filled 2017-10-28 (×2): qty 1

## 2017-10-28 MED ORDER — PROPOFOL 10 MG/ML IV BOLUS
INTRAVENOUS | Status: AC
  Filled 2017-10-28: qty 20

## 2017-10-28 MED ORDER — GUAIFENESIN-DM 100-10 MG/5ML PO SYRP: 10.0000 mL | ORAL_SOLUTION | ORAL | Status: DC | PRN

## 2017-10-28 MED ORDER — ALPRAZOLAM 0.25 MG PO TABS
0.2500 mg | ORAL_TABLET | Freq: Every day | ORAL | Status: DC
  Administered 2017-10-29 – 2017-10-30 (×2): 0.25 mg via ORAL
  Filled 2017-10-28 (×2): qty 1

## 2017-10-28 MED ORDER — GLYCOPYRROLATE 0.2 MG/ML IJ SOLN
INTRAMUSCULAR | Status: DC | PRN
  Administered 2017-10-28: 0.2 mg via INTRAVENOUS

## 2017-10-28 SURGICAL SUPPLY — 66 items
BATTERY INSTRU NAVIGATION (MISCELLANEOUS) ×12 IMPLANT
BLADE SAGITTAL 25.0X1.19X90 (BLADE) ×2 IMPLANT
BLADE SAGITTAL 25.0X1.19X90MM (BLADE) ×1
BLADE SAW 1/2 (BLADE) ×3 IMPLANT
BLADE SAW 70X12.5 (BLADE) ×3 IMPLANT
BONE CEMENT GENTAMICIN (Cement) ×6 IMPLANT
CANISTER SUCT 1200ML W/VALVE (MISCELLANEOUS) ×3 IMPLANT
CANISTER SUCT 3000ML PPV (MISCELLANEOUS) ×6 IMPLANT
CAPT KNEE TOTAL 3 ATTUNE ×3 IMPLANT
CEMENT BONE GENTAMICIN 40 (Cement) ×2 IMPLANT
COOLER POLAR GLACIER W/PUMP (MISCELLANEOUS) ×3 IMPLANT
CUFF TOURN 24 STER (MISCELLANEOUS) IMPLANT
CUFF TOURN 30 STER DUAL PORT (MISCELLANEOUS) ×3 IMPLANT
CUFF TOURN 34 STER (MISCELLANEOUS) IMPLANT
DRAPE SHEET LG 3/4 BI-LAMINATE (DRAPES) ×3 IMPLANT
DRSG DERMACEA 8X12 NADH (GAUZE/BANDAGES/DRESSINGS) ×3 IMPLANT
DRSG OPSITE POSTOP 4X14 (GAUZE/BANDAGES/DRESSINGS) ×3 IMPLANT
DRSG TEGADERM 4X4.75 (GAUZE/BANDAGES/DRESSINGS) ×3 IMPLANT
DURAPREP 26ML APPLICATOR (WOUND CARE) ×6 IMPLANT
ELECT CAUTERY BLADE 6.4 (BLADE) ×3 IMPLANT
ELECT REM PT RETURN 9FT ADLT (ELECTROSURGICAL) ×3
ELECTRODE REM PT RTRN 9FT ADLT (ELECTROSURGICAL) ×1 IMPLANT
EX-PIN ORTHOLOCK NAV 4X150 (PIN) ×6 IMPLANT
GLOVE BIOGEL M STRL SZ7.5 (GLOVE) ×6 IMPLANT
GLOVE BIOGEL PI IND STRL 9 (GLOVE) ×1 IMPLANT
GLOVE BIOGEL PI INDICATOR 9 (GLOVE) ×2
GLOVE INDICATOR 8.0 STRL GRN (GLOVE) ×3 IMPLANT
GLOVE SURG SYN 9.0  PF PI (GLOVE) ×2
GLOVE SURG SYN 9.0 PF PI (GLOVE) ×1 IMPLANT
GOWN STRL REUS W/ TWL LRG LVL3 (GOWN DISPOSABLE) ×2 IMPLANT
GOWN STRL REUS W/TWL 2XL LVL3 (GOWN DISPOSABLE) ×3 IMPLANT
GOWN STRL REUS W/TWL LRG LVL3 (GOWN DISPOSABLE) ×4
HEMOVAC 400CC 10FR (MISCELLANEOUS) ×3 IMPLANT
HOLDER FOLEY CATH W/STRAP (MISCELLANEOUS) ×3 IMPLANT
HOOD PEEL AWAY FLYTE STAYCOOL (MISCELLANEOUS) ×6 IMPLANT
KIT TURNOVER KIT A (KITS) ×3 IMPLANT
KNIFE SCULPS 14X20 (INSTRUMENTS) ×3 IMPLANT
LABEL OR SOLS (LABEL) ×3 IMPLANT
NDL SAFETY ECLIPSE 18X1.5 (NEEDLE) ×1 IMPLANT
NEEDLE HYPO 18GX1.5 SHARP (NEEDLE) ×2
NEEDLE SPNL 20GX3.5 QUINCKE YW (NEEDLE) ×6 IMPLANT
NS IRRIG 500ML POUR BTL (IV SOLUTION) ×3 IMPLANT
PACK TOTAL KNEE (MISCELLANEOUS) ×3 IMPLANT
PAD WRAPON POLAR KNEE (MISCELLANEOUS) ×1 IMPLANT
PIN DRILL QUICK PACK ×3 IMPLANT
PIN FIXATION 1/8DIA X 3INL (PIN) ×9 IMPLANT
PULSAVAC PLUS IRRIG FAN TIP (DISPOSABLE) ×3
SOL .9 NS 3000ML IRR  AL (IV SOLUTION) ×2
SOL .9 NS 3000ML IRR UROMATIC (IV SOLUTION) ×1 IMPLANT
SOL PREP PVP 2OZ (MISCELLANEOUS) ×3
SOLUTION PREP PVP 2OZ (MISCELLANEOUS) ×1 IMPLANT
SPONGE DRAIN TRACH 4X4 STRL 2S (GAUZE/BANDAGES/DRESSINGS) ×3 IMPLANT
STAPLER SKIN PROX 35W (STAPLE) ×3 IMPLANT
STRAP TIBIA SHORT (MISCELLANEOUS) ×3 IMPLANT
SUCTION FRAZIER HANDLE 10FR (MISCELLANEOUS) ×2
SUCTION TUBE FRAZIER 10FR DISP (MISCELLANEOUS) ×1 IMPLANT
SUT VIC AB 0 CT1 36 (SUTURE) ×3 IMPLANT
SUT VIC AB 1 CT1 36 (SUTURE) ×6 IMPLANT
SUT VIC AB 2-0 CT2 27 (SUTURE) ×3 IMPLANT
SYR 20CC LL (SYRINGE) ×3 IMPLANT
SYR 30ML LL (SYRINGE) ×6 IMPLANT
TIP FAN IRRIG PULSAVAC PLUS (DISPOSABLE) ×1 IMPLANT
TOWEL OR 17X26 4PK STRL BLUE (TOWEL DISPOSABLE) ×3 IMPLANT
TOWER CARTRIDGE SMART MIX (DISPOSABLE) ×3 IMPLANT
TRAY FOLEY MTR SLVR 16FR STAT (SET/KITS/TRAYS/PACK) ×3 IMPLANT
WRAPON POLAR PAD KNEE (MISCELLANEOUS) ×3

## 2017-10-28 NOTE — Op Note (Signed)
OPERATIVE NOTE  DATE OF SURGERY:  10/28/2017  PATIENT NAME:  Deborah Miller   DOB: 1955-07-26  MRN: [id number]  PRE-OPERATIVE DIAGNOSIS: Degenerative arthrosis of the right knee, primary  POST-OPERATIVE DIAGNOSIS:  Same  PROCEDURE:  Right total knee arthroplasty using computer-assisted navigation  SURGEON:  Jena GaussJames P Averianna Brugger, Jr. M.D.  ASSISTANT:  Van ClinesJon Wolfe, PA (present and scrubbed throughout the case, critical for assistance with exposure, retraction, instrumentation, and closure)  ANESTHESIA: spinal  ESTIMATED BLOOD LOSS: 100 mL  FLUIDS REPLACED: 1900 mL of crystalloid  TOURNIQUET TIME: 115 minutes  DRAINS: 2 medium Hemovac drains  SOFT TISSUE RELEASES: Anterior cruciate ligament, posterior cruciate ligament, deep medial collateral ligament, patellofemoral ligament  IMPLANTS UTILIZED: DePuy Attune size 3 posterior stabilized femoral component (cemented), size 3 rotating platform tibial component (cemented), 35 mm medialized dome patella (cemented), and an 8 mm stabilized rotating platform polyethylene insert.  INDICATIONS FOR SURGERY: Deborah Miller is a 62 y.o. year old female with a long history of progressive knee pain. X-rays demonstrated severe degenerative changes in tricompartmental fashion. The patient had not seen any significant improvement despite conservative nonsurgical intervention. After discussion of the risks and benefits of surgical intervention, the patient expressed understanding of the risks benefits and agree with plans for total knee arthroplasty.   The risks, benefits, and alternatives were discussed at length including but not limited to the risks of infection, bleeding, nerve injury, stiffness, blood clots, the need for revision surgery, cardiopulmonary complications, among others, and they were willing to proceed.  PROCEDURE IN DETAIL: The patient was brought into the operating room and, after adequate spinal anesthesia was achieved, a tourniquet was  placed on the patient's upper thigh. The patient's knee and leg were cleaned and prepped with alcohol and DuraPrep and draped in the usual sterile fashion. A "timeout" was performed as per usual protocol. The lower extremity was exsanguinated using an Esmarch, and the tourniquet was inflated to 300 mmHg. An anterior longitudinal incision was made followed by a standard mid vastus approach. The deep fibers of the medial collateral ligament were elevated in a subperiosteal fashion off of the medial flare of the tibia so as to maintain a continuous soft tissue sleeve. The patella was subluxed laterally and the patellofemoral ligament was incised. Inspection of the knee demonstrated severe degenerative changes with full-thickness loss of articular cartilage. Osteophytes were debrided using a rongeur. Anterior and posterior cruciate ligaments were excised. Two 4.0 mm Schanz pins were inserted in the femur and into the tibia for attachment of the array of trackers used for computer-assisted navigation. Hip center was identified using a circumduction technique. Distal landmarks were mapped using the computer. The distal femur and proximal tibia were mapped using the computer. The distal femoral cutting guide was positioned using computer-assisted navigation so as to achieve a 5 distal valgus cut.  Due to the bulk of the adipose tissue in the posterior thigh, it was not possible to adequately flex the knee to place the femoral sizing device.  It was thus elected to proceed with the proximal tibial cut.  Medial and lateral menisci were excised. The extramedullary tibial cutting guide was positioned using computer-assisted navigation so as to achieve a 0 varus-valgus alignment and 3 posterior slope. The cut was performed and verified using the computer. The proximal tibia was sized and it was felt that a size 3 tibial tray was appropriate.  Attention was then directed redirected to the distal femur.  The femur was sized  and it  was felt that a size 3 femoral component was appropriate. A size 3 femoral cutting guide was positioned and the anterior cut was performed and verified using the computer. This was followed by completion of the posterior and chamfer cuts. Femoral cutting guide for the central box was then positioned in the center box cut was performed.  Tibial and femoral trials were inserted followed by insertion of an 8 mm polyethylene insert. This allowed for excellent mediolateral soft tissue balancing both in flexion and in full extension. Finally, the patella was cut and prepared so as to accommodate a 35 mm medialized dome patella. A patella trial was placed and the knee was placed through a range of motion with excellent patellar tracking appreciated. The femoral trial was removed after debridement of posterior osteophytes. The central post-hole for the tibial component was reamed followed by insertion of a keel punch. Tibial trials were then removed. Cut surfaces of bone were irrigated with copious amounts of normal saline with antibiotic solution using pulsatile lavage and then suctioned dry. Polymethylmethacrylate cement with gentamicin was prepared in the usual fashion using a vacuum mixer. Cement was applied to the cut surface of the proximal tibia as well as along the undersurface of a size 3 rotating platform tibial component. Tibial component was positioned and impacted into place. Excess cement was removed using Personal assistant. Cement was then applied to the cut surfaces of the femur as well as along the posterior flanges of the size 3 femoral component. The femoral component was positioned and impacted into place. Excess cement was removed using Personal assistant. An 8 mm polyethylene trial was inserted and the knee was brought into full extension with steady axial compression applied. Finally, cement was applied to the backside of a 35 mm medialized dome patella and the patellar component was positioned  and patellar clamp applied. Excess cement was removed using Personal assistant. After adequate curing of the cement, the tourniquet was deflated after a total tourniquet time of 115 minutes. Hemostasis was achieved using electrocautery. The knee was irrigated with copious amounts of normal saline with antibiotic solution using pulsatile lavage and then suctioned dry. 20 mL of 1.3% Exparel and 60 mL of 0.25% Marcaine in 40 mL of normal saline was injected along the posterior capsule, medial and lateral gutters, and along the arthrotomy site. An 8 mm stabilized rotating platform polyethylene insert was inserted and the knee was placed through a range of motion with excellent mediolateral soft tissue balancing appreciated and excellent patellar tracking noted. 2 medium drains were placed in the wound bed and brought out through separate stab incisions. The medial parapatellar portion of the incision was reapproximated using interrupted sutures of #1 Vicryl. Subcutaneous tissue was approximated in layers using first #0 Vicryl followed #2-0 Vicryl. The skin was approximated with skin staples. A sterile dressing was applied.  The patient tolerated the procedure well and was transported to the recovery room in stable condition.    Alexsys Eskin P. Angie Fava., M.D.

## 2017-10-28 NOTE — Anesthesia Preprocedure Evaluation (Signed)
Anesthesia Evaluation  Patient identified by MRN, date of birth, ID band Patient awake    Reviewed: Allergy & Precautions, H&P , NPO status , Patient's Chart, lab work & pertinent test results  History of Anesthesia Complications (+) PONV and history of anesthetic complications  Airway Mallampati: III  TM Distance: >3 FB Neck ROM: limited    Dental no notable dental hx. (+) Teeth Intact   Pulmonary neg shortness of breath, asthma , sleep apnea and Continuous Positive Airway Pressure Ventilation , former smoker,    Pulmonary exam normal breath sounds clear to auscultation       Cardiovascular Exercise Tolerance: Good hypertension, (-) Past MI Normal cardiovascular exam+ Valvular Problems/Murmurs  Rhythm:regular Rate:Normal     Neuro/Psych  Headaches, PSYCHIATRIC DISORDERS Anxiety Depression  Neuromuscular disease    GI/Hepatic Neg liver ROS, hiatal hernia, GERD  Controlled,  Endo/Other  diabetes, Type 2  Renal/GU negative Renal ROS  negative genitourinary   Musculoskeletal negative musculoskeletal ROS (+) Arthritis ,   Abdominal   Peds negative pediatric ROS (+)  Hematology negative hematology ROS (+)   Anesthesia Other Findings Past Medical History:   Asthma                                          1957         Hemorrhoids                                     2012         Hypertension                                                 Breast screening, unspecified                                Acid reflux                                                  Fecal smearing                                               Breast microcalcification, mammographic         2014         Sleep apnea                                                    Comment:cpap   Shortness of breath dyspnea                                    Comment:increased with anxiety   Depression  Anxiety                                                       History of hiatal hernia                                     Headache                                                     Carpal tunnel syndrome                                       Arthritis                                                    Diabetes mellitus without complication (HCC)                 Pain                                                           Comment:chronic lbp,stenosis Morbid Obesity Low Back pain  BMI    Body Mass Index   44.26 kg/m 2      Reproductive/Obstetrics negative OB ROS                             Anesthesia Physical  Anesthesia Plan  ASA: III  Anesthesia Plan: Spinal   Post-op Pain Management:    Induction:   PONV Risk Score and Plan:   Airway Management Planned: Nasal Cannula  Additional Equipment:   Intra-op Plan:   Post-operative Plan:   Informed Consent: I have reviewed the patients History and Physical, chart, labs and discussed the procedure including the risks, benefits and alternatives for the proposed anesthesia with the patient or authorized representative who has indicated his/her understanding and acceptance.   Dental Advisory Given  Plan Discussed with: Anesthesiologist, CRNA and Surgeon  Anesthesia Plan Comments:         Anesthesia Quick Evaluation

## 2017-10-28 NOTE — Progress Notes (Signed)
Physical Therapy Evaluation Patient Details Name: Deborah Miller MRN: [id number] DOB: 01/09/1956 Today's Date: 10/28/2017   History of Present Illness  Pt underwent elective R TKR without reported post-op complications. PT evaluation perform on POD#0 and pt reports mostly full recovery of sensation to RLE. PMH inclues depression, anxiety, OA, DM, low back pain and HTN  Clinical Impression  Pt admitted with above diagnosis. Pt currently with functional limitations due to the deficits listed below (see PT Problem List).  Cues for proper sequencing but no external support provided for bed mobility. Pt provided cues for safe hand placement during transfers. Decreased weight shifting to RLE. She is steady in standing with UE support on rolling walker. Pt able to take short, shuffling steps from bed to recliner with UE support on rolling walker. Denies DOE. Cues for sequencing with walker for forward and backward ambulation in order to reach recliner. She is able to complete all supine exercises as instructed. R knee AAROM is -3 to 86, limited by pain. Recommend SNF at this time. Will monitor progress and update as appropriate. Discussed the possibility of returning home with [facility] PT with patient. Pt will benefit from PT services to address deficits in strength, balance, and mobility in order to return to full function at home.     Follow Up Recommendations SNF    Equipment Recommendations  None recommended by PT    Recommendations for Other Services       Precautions / Restrictions Precautions Precautions: Knee Precaution Booklet Issued: Yes (comment) Required Braces or Orthoses: Knee Immobilizer - Right Knee Immobilizer - Right: Discontinue once straight leg raise with < 10 degree lag Restrictions Weight Bearing Restrictions: Yes RLE Weight Bearing: Weight bearing as tolerated      Mobility  Bed Mobility Overal bed mobility: Needs Assistance Bed Mobility: Supine to Sit     Supine to  sit: Supervision     General bed mobility comments: Cues for proper sequencing but no external support provided  Transfers Overall transfer level: Needs assistance Equipment used: Rolling walker (2 wheeled) Transfers: Sit to/from Stand Sit to Stand: Min guard         General transfer comment: Pt provided cues for safe hand placement during transfers. Decreased weight shifting to RLE. She is steady in standing with UE support on rolling walker  Ambulation/Gait Ambulation/Gait assistance: Min guard Gait Distance (Feet): 5 Feet Assistive device: Rolling walker (2 wheeled) Gait Pattern/deviations: Step-to pattern Gait velocity: Below functional limits for household mobility currently   General Gait Details: Pt able to take short, shuffling steps from bed to recliner with UE support on rolling walker. Denies DOE. UE support on walker. Cues for sequencing with walker for forward and backward ambulation  Stairs            Wheelchair Mobility    Modified Rankin (Stroke Patients Only)       Balance Overall balance assessment: Needs assistance Sitting-balance support: No upper extremity supported Sitting balance-Leahy Scale: Good                                       Pertinent Vitals/Pain Pain Assessment: 0-10 Pain Score: 2  Pain Descriptors / Indicators: Operative site guarding Pain Intervention(s): Monitored during session;Premedicated before session    Home Living Family/patient expects to be discharged to:: Skilled nursing facility Living Arrangements: Alone Available Help at Discharge: Family;Available PRN/intermittently Type of Home: House  Home Access: Stairs to enter Entrance Stairs-Rails: Left Entrance Stairs-Number of Steps: 3(4 steps in back with bilateral wide rails) Home Layout: One level Home Equipment: Walker - 2 wheels;Cane - single point;Bedside commode;Shower seat;Tub bench      Prior Function Level of Independence: Independent  with assistive device(s)         Comments: Infrequent use of single point cane. 1 fall in the last 12 months     Hand Dominance   Dominant Hand: Left    Extremity/Trunk Assessment   Upper Extremity Assessment Upper Extremity Assessment: Overall WFL for tasks assessed    Lower Extremity Assessment Lower Extremity Assessment: RLE deficits/detail RLE Deficits / Details: Reports some residual numbness from spinal. She has full DF/PF. Able to perform SAQ and SLR without assist       Communication   Communication: No difficulties  Cognition Arousal/Alertness: Awake/alert Behavior During Therapy: WFL for tasks assessed/performed Overall Cognitive Status: Within Functional Limits for tasks assessed                                        General Comments      Exercises Total Joint Exercises Ankle Circles/Pumps: Both;15 reps Quad Sets: Both;15 reps Gluteal Sets: Both;15 reps Towel Squeeze: Both;15 reps Short Arc Quad: Right;15 reps Heel Slides: Right;15 reps Hip ABduction/ADduction: Right;15 reps Straight Leg Raises: Right;15 reps Goniometric ROM: -3 to 86 degrees AAROM, pain limited   Assessment/Plan    PT Assessment Patient needs continued PT services  PT Problem List Decreased strength;Decreased range of motion;Decreased activity tolerance;Decreased balance;Decreased mobility       PT Treatment Interventions DME instruction;Gait training;Functional mobility training;Therapeutic activities;Therapeutic exercise;Balance training;Neuromuscular re-education;Patient/family education;Manual techniques    PT Goals (Current goals can be found in the Care Plan section)  Acute Rehab PT Goals Patient Stated Goal: Return to prior function at home PT Goal Formulation: With patient Time For Goal Achievement: 11/11/17 Potential to Achieve Goals: Good    Frequency BID   Barriers to discharge        Co-evaluation               AM-PAC PT "6 Clicks"  Daily Activity  Outcome Measure Difficulty turning over in bed (including adjusting bedclothes, sheets and blankets)?: A Little Difficulty moving from lying on back to sitting on the side of the bed? : A Little Difficulty sitting down on and standing up from a chair with arms (e.g., wheelchair, bedside commode, etc,.)?: A Little Help needed moving to and from a bed to chair (including a wheelchair)?: A Little Help needed walking in hospital room?: A Little Help needed climbing 3-5 steps with a railing? : Total 6 Click Score: 16    End of Session Equipment Utilized During Treatment: Gait belt Activity Tolerance: Patient tolerated treatment well Patient left: with call bell/phone within reach;in chair;with chair alarm set;with SCD's reapplied;Other (comment)(towel roll under heel, polar care in place) Nurse Communication: Mobility status PT Visit Diagnosis: Muscle weakness (generalized) (M62.81);Pain;Difficulty in walking, not elsewhere classified (R26.2) Pain - Right/Left: Right Pain - part of body: Knee    Time: 1550-1630 PT Time Calculation (min) (ACUTE ONLY): 40 min   Charges:   PT Evaluation $PT Eval Low Complexity: 1 Low PT Treatments $Therapeutic Exercise: 8-22 mins   PT G Codes:        Mali Eppard D Chelsee Hosie PT, DPT, GCS   Marwan Lipe 10/28/2017, 5:11 PM

## 2017-10-28 NOTE — Progress Notes (Signed)
Pt admitted to room 133 from PACU. Pt is A&Ox4. Polar care, hemovac, and foley in place; surgical dressing to right knee CDI. VSS. Pt and family updated on plan of care. Pt given prn hydrocodone and tramadol for pain with decrease in pain reported.   [place], Latricia HeftKorie G

## 2017-10-28 NOTE — Discharge Instructions (Signed)
°  Instructions after Total Knee Replacement ° ° Rolen Conger P. Callaway Hardigree, Jr., M.D.    ° Dept. of Orthopaedics & Sports Medicine ° [facility] ° [address] ° [place], [place]  [address] ° Phone: [phone]   Fax: [phone] ° °  °DIET: °• Drink plenty of non-alcoholic fluids. °• Resume your normal diet. Include foods high in fiber. ° °ACTIVITY:  °• You may use crutches or a walker with weight-bearing as tolerated, unless instructed otherwise. °• You may be weaned off of the walker or crutches by your Physical Therapist.  °• Do NOT place pillows under the knee. Anything placed under the knee could limit your ability to straighten the knee.   °• Continue doing gentle exercises. Exercising will reduce the pain and swelling, increase motion, and prevent muscle weakness.   °• Please continue to use the TED compression stockings for 6 weeks. You may remove the stockings at night, but should reapply them in the morning. °• Do not drive or operate any equipment until instructed. ° °WOUND CARE:  °• Continue to use the PolarCare or ice packs periodically to reduce pain and swelling. °• You may bathe or shower after the staples are removed at the first office visit following surgery. ° °MEDICATIONS: °• You may resume your regular medications. °• Please take the pain medication as prescribed on the medication. °• Do not take pain medication on an empty stomach. °• You have been given a prescription for a blood thinner (Lovenox or Coumadin). Please take the medication as instructed. (NOTE: After completing a 2 week course of Lovenox, take one Enteric-coated aspirin once a day. This along with elevation will help reduce the possibility of phlebitis in your operated leg.) °• Do not drive or drink alcoholic beverages when taking pain medications. ° °CALL THE OFFICE FOR: °• Temperature above 101 degrees °• Excessive bleeding or drainage on the dressing. °• Excessive swelling, coldness, or paleness of the toes. °• Persistent  nausea and vomiting. ° °FOLLOW-UP:  °• You should have an appointment to return to the office in 10-14 days after surgery. °• Arrangements have been made for continuation of Physical Therapy (either home therapy or outpatient therapy). °  °

## 2017-10-28 NOTE — Transfer of Care (Signed)
Immediate Anesthesia Transfer of Care Note  Patient: Deborah HalimMaryann C Miller  Procedure(s) Performed: COMPUTER ASSISTED TOTAL KNEE ARTHROPLASTY (Right )  Patient Location: PACU  Anesthesia Type:Spinal  Level of Consciousness: awake, alert , oriented and patient cooperative  Airway & Oxygen Therapy: Patient Spontanous Breathing and Patient connected to nasal cannula oxygen  Post-op Assessment: Report given to RN and Post -op Vital signs reviewed and stable  Post vital signs: Reviewed and stable  Last Vitals:  Vitals Value Taken Time  BP    Temp    Pulse 96 10/28/2017 11:34 AM  Resp 19 10/28/2017 11:34 AM  SpO2 91 % 10/28/2017 11:34 AM  Vitals shown include unvalidated device data.  Last Pain:  Vitals:   10/28/17 0622  TempSrc: Oral  PainSc: 7          Complications: No apparent anesthesia complications

## 2017-10-28 NOTE — Anesthesia Post-op Follow-up Note (Signed)
Anesthesia QCDR form completed.        

## 2017-10-28 NOTE — Progress Notes (Signed)
Home CPAP machine checked, machine in good condition, no cracks or tears in plastic or wiring. Machine clean and in operating condition.

## 2017-10-28 NOTE — Anesthesia Procedure Notes (Signed)
Spinal  Patient location during procedure: OR Start time: 10/28/2017 7:26 AM End time: 10/28/2017 7:31 AM Staffing Resident/CRNA: , , CRNA Performed: resident/CRNA  Preanesthetic Checklist Completed: patient identified, site marked, surgical consent, pre-op evaluation, timeout performed, IV checked, risks and benefits discussed and monitors and equipment checked Spinal Block Patient position: sitting Prep: ChloraPrep Patient monitoring: heart rate, continuous pulse ox, blood pressure and cardiac monitor Approach: midline Location: L4-5 Injection technique: single-shot Needle Needle type: Whitacre and Introducer  Needle gauge: 27 G Needle length: 12.7 cm Additional Notes Negative paresthesia. Negative blood return. Positive free-flowing CSF. Expiration date of kit checked and confirmed. Patient tolerated procedure well, without complications.       

## 2017-10-28 NOTE — H&P (Signed)
The patient has been re-examined, and the chart reviewed, and there have been no interval changes to the documented history and physical.    The risks, benefits, and alternatives have been discussed at length. The patient expressed understanding of the risks benefits and agreed with plans for surgical intervention.  James P. Hooten, Jr. M.D.    

## 2017-10-29 ENCOUNTER — Encounter
Admission: RE | Admit: 2017-10-29 | Discharge: 2017-10-29 | Disposition: A | Discharge status: Home / Self Care | Payer: BC Managed Care – PPO | Source: Ambulatory Visit | Attending: Internal Medicine | Admitting: Internal Medicine

## 2017-10-29 LAB — GLUCOSE, CAPILLARY
Glucose-Capillary: 115 mg/dL — ABNORMAL HIGH (ref 70–99)
Glucose-Capillary: 106 mg/dL — ABNORMAL HIGH (ref 70–99)
Glucose-Capillary: 96 mg/dL (ref 70–99)
Glucose-Capillary: 120 mg/dL — ABNORMAL HIGH (ref 70–99)

## 2017-10-29 MED ORDER — TRAMADOL HCL 50 MG PO TABS: 50.0000 mg | ORAL_TABLET | ORAL | 0 refills | Status: DC | PRN

## 2017-10-29 MED ORDER — OXYCODONE HCL 5 MG PO TABS: 5.0000 mg | ORAL_TABLET | ORAL | 0 refills | Status: DC | PRN

## 2017-10-29 MED ORDER — ENOXAPARIN SODIUM 30 MG/0.3ML ~~LOC~~ SOLN: 30.0000 mg | Freq: Two times a day (BID) | SUBCUTANEOUS | 0 refills | Status: DC

## 2017-10-29 NOTE — Progress Notes (Signed)
Physical Therapy Treatment Patient Details Name: Deborah Miller MRN: [id number] DOB: 1956-03-27 Today's Date: 10/29/2017    History of Present Illness 62yo female pt underwent elective R TKR without reported post-op complications. PT evaluation perform on POD#0 and pt reports mostly full recovery of sensation to RLE. PMH inclues depression, anxiety, OA, DM, low back pain and HTN    PT Comments    Pt calling, requesting to get back to bed due to general discomfort.  She was able to stand with min guard/supervision and walk a small loop by her bed with walker and min guard.  After sitting on bed, she requested to use bedside commode but was unable to void.  She returned to supine with mod a x 1 for LE management.  Bone foam in place.  Pt generally fatigued and wanting to nap before lunch.  Pt was encouraged to sit up in recliner again this afternoon and she voiced understanding.   Follow Up Recommendations  SNF     Equipment Recommendations  None recommended by PT    Recommendations for Other Services       Precautions / Restrictions Precautions Precautions: Knee Required Braces or Orthoses: Knee Immobilizer - Right Knee Immobilizer - Right: Discontinue once straight leg raise with < 10 degree lag Restrictions Weight Bearing Restrictions: Yes RLE Weight Bearing: Weight bearing as tolerated Other Position/Activity Restrictions: KI not used as she was able to complete SLR <10 lag and no buckling noted during gait.    Mobility  Bed Mobility Overal bed mobility: Needs Assistance Bed Mobility: Sit to Supine     Supine to sit: Supervision Sit to supine: Mod assist   General bed mobility comments: LE management  Transfers Overall transfer level: Needs assistance Equipment used: Rolling walker (2 wheeled) Transfers: Sit to/from Stand Sit to Stand: Min guard         General transfer comment: VC for hand placement on RW  Ambulation/Gait Ambulation/Gait assistance: Min  guard Gait Distance (Feet): 6 Feet Assistive device: Rolling walker (2 wheeled) Gait Pattern/deviations: Step-to pattern;Decreased step length - right;Decreased step length - left Gait velocity: Below functional limits for household mobility currently   General Gait Details: 5' x 2 - required seated rest.   Stairs             Wheelchair Mobility    Modified Rankin (Stroke Patients Only)       Balance Overall balance assessment: Needs assistance Sitting-balance support: No upper extremity supported Sitting balance-Leahy Scale: Good     Standing balance support: Bilateral upper extremity supported Standing balance-Leahy Scale: Fair                              Cognition Arousal/Alertness: Awake/alert Behavior During Therapy: WFL for tasks assessed/performed Overall Cognitive Status: Within Functional Limits for tasks assessed                                        Exercises Total Joint Exercises Ankle Circles/Pumps: Both;15 reps Quad Sets: Both;15 reps Heel Slides: Right;10 reps;Supine Hip ABduction/ADduction: Right;10 reps;Supine Straight Leg Raises: Right;10 reps;Supine Long Arc Quad: 10 reps;Right;Seated Knee Flexion: 5 reps;Seated;Right;AAROM Goniometric ROM: 0-70 - self limited despite pain meds prior.  Pt stated she thougth a staple was pulling on her skin increasing her pain.  Unable to observe due to bulky dressing remaining.  Refused further stretching. Other Exercises Other Exercises: to commode at bedside unable to void.      General Comments        Pertinent Vitals/Pain Pain Assessment: 0-10 Pain Score: 6  Faces Pain Scale: Hurts even more Pain Location: R knee Pain Descriptors / Indicators: Operative site guarding;Sore Pain Intervention(s): Limited activity within patient's tolerance;Ice applied;Monitored during session;Repositioned    Home Living Family/patient expects to be discharged to:: Skilled nursing  facility Living Arrangements: Alone Available Help at Discharge: Family;Available PRN/intermittently Type of Home: House Home Access: Stairs to enter Entrance Stairs-Rails: Left Home Layout: One level Home Equipment: Walker - 2 wheels;Cane - single point;Bedside commode;Tub bench      Prior Function Level of Independence: Independent with assistive device(s)      Comments: Infrequent use of single point cane. 1 fall in the last 12 months. Indep with ADL and IADL, however reports difficulty with household cleaning tasks   PT Goals (current goals can now be found in the care plan section) Acute Rehab PT Goals Patient Stated Goal: go to rehab and then return home with improved safety and independence and less pain Progress towards PT goals: Progressing toward goals    Frequency    BID      PT Plan Current plan remains appropriate    Co-evaluation              AM-PAC PT "6 Clicks" Daily Activity  Outcome Measure  Difficulty turning over in bed (including adjusting bedclothes, sheets and blankets)?: A Little Difficulty moving from lying on back to sitting on the side of the bed? : A Little Difficulty sitting down on and standing up from a chair with arms (e.g., wheelchair, bedside commode, etc,.)?: Unable Help needed moving to and from a bed to chair (including a wheelchair)?: A Little Help needed walking in hospital room?: A Little Help needed climbing 3-5 steps with a railing? : Total 6 Click Score: 14    End of Session Equipment Utilized During Treatment: Gait belt Activity Tolerance: Patient tolerated treatment well Patient left: with call bell/phone within reach;with SCD's reapplied;Other (comment);in bed;with bed alarm set   Pain - Right/Left: Right Pain - part of body: Knee     Time: [id number] PT Time Calculation (min) (ACUTE ONLY): 13 min  Charges:  $Gait Training: 8-22 mins $Therapeutic Exercise: 8-22 mins                    G Codes:       Danielle DessSarah  Kaylie Ritter, PTA 10/29/17, 11:50 AM

## 2017-10-29 NOTE — Clinical Social Work Note (Signed)
Clinical Social Work Assessment  Patient Details  Name: Deborah Miller MRN: [id number] Date of Birth: 02-28-1956  Date of referral:  10/29/17               Reason for consult:  Facility Placement                Permission sought to share information with:  Chartered certified accountant granted to share information::  Yes, Verbal Permission Granted  Name::      [place]::   [place]   Relationship::     Contact Information:     Housing/Transportation Living arrangements for the past 2 months:  [place] of Information:  Patient Patient Interpreter Needed:  None Criminal Activity/Legal Involvement Pertinent to Current Situation/Hospitalization:  No - Comment as needed Significant Relationships:  Siblings Lives with:  Self Do you feel safe going back to the place where you live?  Yes Need for family participation in patient care:  Yes (Comment)  Care giving concerns:  Patient lives alone in [place].    Social Worker assessment / plan:  Holiday representative ([place]) received SNF consult. PT is recommending SNF. CSW met with patient alone at bedside to discuss D/C plan. Patient was alert and oriented X4 and was sitting up on the side of the bed working with OT. CSW introduced self and explained role of CSW department. Patient reported that she lives alone in [place] and her half sister Vaughan Basta is her HPOA. CSW explained SNF process and that [place] will have to approve SNF. Patient verbalized her understanding and is agreeable to SNF search in [place]. Patient prefers [facility] FL2 complete and faxed out.   CSW presented bed offers to patient and she chose [facility] Per [facility] admissions coordinator at [facility] she will start [facility] authorization today. CSW faxed H&P to [facility]. CSW will continue to follow and assist as needed.    Employment status:  Psychologist, counselling:  Managed Care PT  Recommendations:  [place] / Referral to community resources:  [place]  Patient/Family's Response to care:  Patient is agreeable to D/C to [facility]   Patient/Family's Understanding of and Emotional Response to Diagnosis, Current Treatment, and Prognosis:  Patient was very pleasant and thanked CSW for assistance.   Emotional Assessment Appearance:  Appears stated age Attitude/Demeanor/Rapport:    Affect (typically observed):  Accepting, Adaptable, Pleasant Orientation:  Oriented to Self, Oriented to Place, Oriented to  Time, Oriented to Situation Alcohol / Substance use:  Not Applicable Psych involvement (Current and /or in the community):  No (Comment)  Discharge Needs  Concerns to be addressed:  Discharge Planning Concerns Readmission within the last 30 days:  No Current discharge risk:  Dependent with Mobility Barriers to Discharge:  Continued Medical Work up   [employer], Veronia Beets, LCSW 10/29/2017, 9:28 AM

## 2017-10-29 NOTE — [place] FL2 (Signed)
MEDICAID FL2 LEVEL OF CARE SCREENING TOOL     IDENTIFICATION  Patient Name: Deborah Miller Birthdate: 07-02-1955 Sex: female Admission Date (Current Location): 10/28/2017  [place]   Facility and Address:  [facility], [address], [place], [place] [address]      Provider Number: [id number]  Attending Physician Name and Address:  Donato HeinzHooten, James P, MD  Relative Name and Phone Number:       Current Level of Care: Hospital Recommended Level of Care: Skilled Nursing Facility Prior Approval Number:    Date Approved/Denied:   PASRR Number: ([id number])  Discharge Plan: SNF    Current Diagnoses: Patient Active Problem List   Diagnosis Date Noted  . S/P total knee arthroplasty 10/28/2017  . Diastasis recti 07/25/2016  . Change in bowel habits 07/25/2016  . Benign essential hypertension 05/01/2016  . Obesity, Class III, BMI 40-49.9 (morbid obesity) (HCC) 11/29/2015  . History of abnormal cervical Pap smear 11/29/2015  . Family history of osteoporosis 11/29/2015  . Menopausal syndrome 11/29/2015  . RAD (reactive airway disease), moderate persistent, uncomplicated 10/31/2015  . Controlled type 2 diabetes mellitus without complication, without long-term current use of insulin (HCC) 05/01/2015  . S/P total hip arthroplasty 02/14/2015  . Lumbar stenosis with neurogenic claudication 11/30/2014  . DDD (degenerative disc disease), lumbar 11/16/2014  . Status post left partial knee replacement 04/02/2014  . Esophageal reflux 03/21/2014  . Cor pulmonale (HCC) 10/23/2013  . OSA (obstructive sleep apnea) 10/23/2013  . Other screening mammogram 05/26/2013  . Breast microcalcification, mammographic 10/28/2012  . Superficial skin lesion 10/28/2012    Orientation RESPIRATION BLADDER Height & Weight     Self, Time, Situation, Place  Normal Continent Weight: 204 lb 14.4 oz (92.9 kg) Height:  4' 7.5" (141 cm)   BEHAVIORAL SYMPTOMS/MOOD NEUROLOGICAL BOWEL NUTRITION STATUS      Continent Diet(Diet: Carb Modifed. )  AMBULATORY STATUS COMMUNICATION OF NEEDS Skin   Extensive Assist Verbally Surgical wounds(Incision: Right Knee)                       Personal Care Assistance Level of Assistance  Bathing, Feeding, Dressing Bathing Assistance: Limited assistance Feeding assistance: Independent Dressing Assistance: Limited assistance     Functional Limitations Info  Sight, Hearing, Speech Sight Info: Adequate Hearing Info: Adequate Speech Info: Adequate    SPECIAL CARE FACTORS FREQUENCY  PT (By licensed PT), OT (By licensed OT)     PT Frequency: (5) OT Frequency: (5)            Contractures      Additional Factors Info  Code Status, Allergies Code Status Info: (Full Code. ) Allergies Info: (Citrus, Pork-derived Products, Augmentin Amoxicillin-pot Clavulanate, Latex, Morphine And Related, Cefadroxil, Other, Pollen Extract, Tape)           Current Medications (10/29/2017):  This is the current hospital active medication list Current Facility-Administered Medications  Medication Dose Route Frequency Provider Last Rate Last Dose  . 0.9 %  sodium chloride infusion   Intravenous Continuous Hooten, Illene LabradorJames P, MD 100 mL/hr at 10/28/17 2309    . acetaminophen (OFIRMEV) IV 1,000 mg  1,000 mg Intravenous Q6H Hooten, Illene LabradorJames P, MD   Stopped at 10/29/17 0542  . acetaminophen (TYLENOL) tablet 325-650 mg  325-650 mg Oral Q6H PRN Hooten, Illene LabradorJames P, MD      . albuterol (PROVENTIL) (2.5 MG/3ML) 0.083% nebulizer solution 3 mL  3 mL Inhalation Q6H PRN  Hooten, Illene Labrador, MD      . ALPRAZolam Prudy Feeler) tablet 0.25 mg  0.25 mg Oral Daily Hooten, Illene Labrador, MD      . alum & mag hydroxide-simeth (MAALOX/MYLANTA) 200-200-20 MG/5ML suspension 30 mL  30 mL Oral Q4H PRN Hooten, Illene Labrador, MD      . bisacodyl (DULCOLAX) suppository 10 mg  10 mg Rectal Daily PRN Hooten, Illene Labrador, MD      . calcium carbonate (TUMS - dosed  in mg elemental calcium) chewable tablet 200-600 mg of elemental calcium  1-3 tablet Oral BID PRN Hooten, Illene Labrador, MD      . calcium-vitamin D (OSCAL WITH D) 500-200 MG-UNIT per tablet 1 tablet  1 tablet Oral Daily Hooten, Illene Labrador, MD      . celecoxib (CELEBREX) capsule 200 mg  200 mg Oral BID Donato Heinz, MD   200 mg at 10/28/17 2035  . cholestyramine light (PREVALITE) packet 4 g  4 g Oral Daily PRN Hooten, Illene Labrador, MD      . clindamycin (CLEOCIN) IVPB 600 mg  600 mg Intravenous Q6H Hooten, Illene Labrador, MD   Stopped at 10/29/17 0230  . diphenhydrAMINE (BENADRYL) 12.5 MG/5ML elixir 12.5-25 mg  12.5-25 mg Oral Q4H PRN Hooten, Illene Labrador, MD      . enoxaparin (LOVENOX) injection 30 mg  30 mg Subcutaneous Q12H Hooten, Illene Labrador, MD      . ferrous sulfate tablet 325 mg  325 mg Oral BID WC Hooten, Illene Labrador, MD   325 mg at 10/28/17 1740  . gabapentin (NEURONTIN) capsule 300 mg  300 mg Oral BID Donato Heinz, MD   300 mg at 10/28/17 2034  . guaiFENesin-dextromethorphan (ROBITUSSIN DM) 100-10 MG/5ML syrup 10 mL  10 mL Oral Q4H PRN Hooten, Illene Labrador, MD      . HYDROmorphone (DILAUDID) injection 0.5-1 mg  0.5-1 mg Intravenous Q4H PRN Hooten, Illene Labrador, MD      . insulin aspart (novoLOG) injection 0-15 Units  0-15 Units Subcutaneous TID WC Hooten, Illene Labrador, MD   3 Units at 10/28/17 1743  . loratadine (CLARITIN) tablet 10 mg  10 mg Oral Daily Hooten, Illene Labrador, MD      . magnesium hydroxide (MILK OF MAGNESIA) suspension 30 mL  30 mL Oral Daily Hooten, Illene Labrador, MD   30 mL at 10/28/17 1741  . menthol-cetylpyridinium (CEPACOL) lozenge 3 mg  1 lozenge Oral PRN Hooten, Illene Labrador, MD       Or  . phenol (CHLORASEPTIC) mouth spray 1 spray  1 spray Mouth/Throat PRN Hooten, Illene Labrador, MD      . metoCLOPramide (REGLAN) tablet 5-10 mg  5-10 mg Oral Q8H PRN Hooten, Illene Labrador, MD       Or  . metoCLOPramide (REGLAN) injection 5-10 mg  5-10 mg Intravenous Q8H PRN Hooten, Illene Labrador, MD      . metoCLOPramide (REGLAN) tablet 10 mg  10 mg Oral  TID AC & HS Hooten, Illene Labrador, MD   10 mg at 10/28/17 2035  . mometasone-formoterol (DULERA) 200-5 MCG/ACT inhaler 2 puff  2 puff Inhalation BID Donato Heinz, MD   2 puff at 10/28/17 2033  . multivitamin with minerals tablet 1 tablet  1 tablet Oral Daily Hooten, Illene Labrador, MD      . ondansetron (ZOFRAN) tablet 4 mg  4 mg Oral Q6H PRN Hooten, Illene Labrador, MD       Or  . ondansetron (ZOFRAN) injection 4 mg  4 mg Intravenous Q6H PRN Hooten, Illene Labrador, MD      . oxyCODONE (Oxy IR/ROXICODONE) immediate release tablet 10 mg  10 mg Oral Q4H PRN Hooten, Illene Labrador, MD   10 mg at 10/28/17 1749  . oxyCODONE (Oxy IR/ROXICODONE) immediate release tablet 5 mg  5 mg Oral Q4H PRN Hooten, Illene Labrador, MD   5 mg at 10/28/17 2308  . pantoprazole (PROTONIX) EC tablet 40 mg  40 mg Oral BID Donato Heinz, MD   40 mg at 10/28/17 2036  . polyvinyl alcohol (LIQUIFILM TEARS) 1.4 % ophthalmic solution 1 drop  1 drop Both Eyes PRN Hooten, Illene Labrador, MD      . potassium chloride (K-DUR,KLOR-CON) CR tablet 10 mEq  10 mEq Oral Daily Hooten, Illene Labrador, MD   10 mEq at 10/28/17 1452  . pramipexole (MIRAPEX) tablet 0.125 mg  0.125 mg Oral QHS Hooten, Illene Labrador, MD   0.125 mg at 10/28/17 2036  . senna-docusate (Senokot-S) tablet 1 tablet  1 tablet Oral BID Hooten, Illene Labrador, MD      . sodium chloride (OCEAN) 0.65 % nasal spray 1 spray  1 spray Each Nare PRN Hooten, Illene Labrador, MD      . sodium phosphate (FLEET) 7-19 GM/118ML enema 1 enema  1 enema Rectal Once PRN Hooten, Illene Labrador, MD      . tiotropium (SPIRIVA) inhalation capsule 18 mcg  1 capsule Inhalation Daily Hooten, Illene Labrador, MD      . torsemide (DEMADEX) tablet 20 mg  20 mg Oral Daily Hooten, Illene Labrador, MD      . traMADol Janean Sark) tablet 50-100 mg  50-100 mg Oral Q4H PRN Donato Heinz, MD   100 mg at 10/29/17 9604  . valACYclovir (VALTREX) tablet 2,000 mg  2,000 mg Oral BID PRN Hooten, Illene Labrador, MD         Discharge Medications: Please see discharge summary for a list of discharge  medications.  Relevant Imaging Results:  Relevant Lab Results:   Additional Information (SSN: [id number])  Mordecai Tindol, Darleen Crocker, LCSW

## 2017-10-29 NOTE — Discharge Summary (Signed)
Physician Discharge Summary  Patient ID: Deborah Miller MRN: [id number] DOB/AGE: 62/11/1955 62 y.o.  Admit date: 10/28/2017 Discharge date: 10/30/2017  Admission Diagnoses:  PRIMARY OSTEOARTHRITIS OF RIGHT KNEE,MORBID OBESITY WITH BODY MASS INDEX   Discharge Diagnoses: Patient Active Problem List   Diagnosis Date Noted  . S/P total knee arthroplasty 10/28/2017  . Diastasis recti 07/25/2016  . Change in bowel habits 07/25/2016  . Benign essential hypertension 05/01/2016  . Obesity, Class III, BMI 40-49.9 (morbid obesity) (HCC) 11/29/2015  . History of abnormal cervical Pap smear 11/29/2015  . Family history of osteoporosis 11/29/2015  . Menopausal syndrome 11/29/2015  . RAD (reactive airway disease), moderate persistent, uncomplicated 10/31/2015  . Controlled type 2 diabetes mellitus without complication, without long-term current use of insulin (HCC) 05/01/2015  . S/P total hip arthroplasty 02/14/2015  . Lumbar stenosis with neurogenic claudication 11/30/2014  . DDD (degenerative disc disease), lumbar 11/16/2014  . Status post left partial knee replacement 04/02/2014  . Esophageal reflux 03/21/2014  . Cor pulmonale (HCC) 10/23/2013  . OSA (obstructive sleep apnea) 10/23/2013  . Other screening mammogram 05/26/2013  . Breast microcalcification, mammographic 10/28/2012  . Superficial skin lesion 10/28/2012    Past Medical History:  Diagnosis Date  . Acid reflux   . Anxiety   . Arthritis   . Asthma 1957  . Breast microcalcification, mammographic 2014  . Breast screening, unspecified   . Carpal tunnel syndrome 1999   repaired left wrist  . Depression   . Diabetes mellitus without complication (HCC) 2019   borderline, follows diabetic diet often  . Fecal smearing   . Headache    sinus migraines. takes OTC meds for allergies  . Heart murmur    as a child, not treated  . Hemorrhoids 2012  . History of hiatal hernia   . Hypertension   . Lump in thyroid 2019   having  an ultrasound 10/2017 to rule out tumor or salivary gland enlargement  . Pain    chronic lbp,stenosis  . PONV (postoperative nausea and vomiting)    nausea and vomitting due to morphine  . Shortness of breath dyspnea    increased with anxiety  . Sleep apnea    cpap     Transfusion: No transfusions during this admission   Consultants (if any):   Discharged Condition: Improved  Hospital Course: Deborah Miller is an 62 y.o. female who was admitted 10/28/2017 with a diagnosis of degenerative arthrosis right knee and went to the operating room on 10/28/2017 and underwent the above named procedures.    Surgeries:Procedure(s): COMPUTER ASSISTED TOTAL KNEE ARTHROPLASTY on 10/28/2017  PRE-OPERATIVE DIAGNOSIS: Degenerative arthrosis of the right knee, primary  POST-OPERATIVE DIAGNOSIS:  Same  PROCEDURE:  Right total knee arthroplasty using computer-assisted navigation  SURGEON:  Jena Gauss. M.D.  ASSISTANT:  Van Clines, PA (present and scrubbed throughout the case, critical for assistance with exposure, retraction, instrumentation, and closure)  ANESTHESIA: spinal  ESTIMATED BLOOD LOSS: 100 mL  FLUIDS REPLACED: 1900 mL of crystalloid  TOURNIQUET TIME: 115 minutes  DRAINS: 2 medium Hemovac drains  SOFT TISSUE RELEASES: Anterior cruciate ligament, posterior cruciate ligament, deep medial collateral ligament, patellofemoral ligament  IMPLANTS UTILIZED: DePuy Attune size 3 posterior stabilized femoral component (cemented), size 3 rotating platform tibial component (cemented), 35 mm medialized dome patella (cemented), and an 8 mm stabilized rotating platform polyethylene insert.  INDICATIONS FOR SURGERY: Deborah Miller is a 62 y.o. year old female with a long history of progressive knee pain.  X-rays demonstrated severe degenerative changes in tricompartmental fashion. The patient had not seen any significant improvement despite conservative nonsurgical intervention.  After discussion of the risks and benefits of surgical intervention, the patient expressed understanding of the risks benefits and agree with plans for total knee arthroplasty.   The risks, benefits, and alternatives were discussed at length including but not limited to the risks of infection, bleeding, nerve injury, stiffness, blood clots, the need for revision surgery, cardiopulmonary complications, among others, and they were willing to proceed.    Patient tolerated the surgery well. No complications .Patient was taken to PACU where she was stabilized and then transferred to the orthopedic floor.  Patient started on Lovenox 30 mg q 12 hrs. Foot pumps applied bilaterally at 80 mm hgb. Heels elevated off bed with rolled towels. No evidence of DVT. Calves non tender. Negative Homan. Physical therapy started on day #1 for gait training and transfer with OT starting on  day #1 for ADL and assisted devices. Patient has done well with therapy. Ambulated 10 feet upon being discharged.  Patient's IV And Foley were discontinued on day #1 with Hemovac being discontinued on day #2. Dressing was changed on day 2 prior to patient being discharged   She was given perioperative antibiotics:  Anti-infectives (From admission, onward)   Start     Dose/Rate Route Frequency Ordered Stop   10/28/17 1400  clindamycin (CLEOCIN) IVPB 600 mg     600 mg 100 mL/hr over 30 Minutes Intravenous Every 6 hours 10/28/17 1301 10/29/17 1359   10/28/17 1301  valACYclovir (VALTREX) tablet 2,000 mg    Note to Pharmacy:  Take for only 1 day for fever blister symptoms     2,000 mg Oral 2 times daily PRN 10/28/17 1301     10/28/17 0600  clindamycin (CLEOCIN) IVPB 900 mg     900 mg 100 mL/hr over 30 Minutes Intravenous On call to O.R. 10/28/17 0103 10/28/17 0802   10/28/17 0600  clindamycin (CLEOCIN) 900 MG/50ML IVPB    Note to Pharmacy:  Deborah Miller   : cabinet override      10/28/17 0600 10/28/17 0750    .  She was  fitted with AV 1 compression foot pump devices, instructed on heel pumps, early ambulation, and fitted with TED stockings bilaterally for DVT prophylaxis.  She benefited maximally from the hospital stay and there were no complications.    Recent vital signs:  Vitals:   10/29/17 0022 10/29/17 0445  BP: (!) 114/59 (!) 106/40  Pulse: 81 72  Resp: 18 18  Temp: 98.3 F (36.8 C) (!) 97.4 F (36.3 C)  SpO2: 99% 100%    Recent laboratory studies:  Lab Results  Component Value Date   HGB 14.6 10/15/2017   HGB 11.4 (L) 02/16/2015   HGB 11.2 (L) 02/15/2015   Lab Results  Component Value Date   WBC 10.0 10/15/2017   PLT 246 10/15/2017   Lab Results  Component Value Date   INR 0.92 10/15/2017   Lab Results  Component Value Date   NA 140 10/15/2017   K 3.6 10/15/2017   CL 102 10/15/2017   CO2 26 10/15/2017   BUN 23 (H) 10/15/2017   CREATININE 0.81 10/15/2017   GLUCOSE 145 (H) 10/15/2017    Discharge Medications:   Allergies as of 10/29/2017      Reactions   Citrus    Itchy throat    Pork-derived Products    Itchy throat    Augmentin [  amoxicillin-pot Clavulanate] Nausea And Vomiting   Has patient had a PCN reaction causing immediate rash, facial/tongue/throat swelling, SOB or lightheadedness with hypotension: No Has patient had a PCN reaction causing severe rash involving mucus membranes or skin necrosis: No Has patient had a PCN reaction that required hospitalization: No Has patient had a PCN reaction occurring within the last 10 years: Yes If all of the above answers are "NO", then may proceed with Cephalosporin use.   Latex Other (See Comments)   Blisters NOTE: Latex IgE NEGATIVE (<0.10) Around mouth causes a rash   Morphine And Related Nausea And Vomiting   Patient does not want morphine at all.   Cefadroxil Other (See Comments)   Severe cramping in side. Felt like ribs were going to explode   Other Other (See Comments)   Lettuce causes her to start with  diverticulitis   Pollen Extract Other (See Comments)   Trees, grass, mold, dust causes raspy voice, nasal irritation   Tape Other (See Comments)   Blisters. Paper tape is acceptable      Medication List    STOP taking these medications   meloxicam 7.5 MG tablet Commonly known as:  MOBIC     TAKE these medications   acetaminophen 500 MG tablet Commonly known as:  TYLENOL Take 650 mg by mouth every 6 (six) hours as needed for moderate pain or headache. If no relief of pain within one hour, patient takes a second one.   ADVAIR HFA 115-21 MCG/ACT inhaler Generic drug:  fluticasone-salmeterol Inhale 2 puffs into the lungs 2 (two) times daily.   albuterol 108 (90 Base) MCG/ACT inhaler Commonly known as:  PROVENTIL HFA;VENTOLIN HFA Inhale 2 puffs into the lungs every 6 (six) hours as needed for wheezing or shortness of breath.   ALPRAZolam 0.25 MG tablet Commonly known as:  XANAX Take 0.25 mg by mouth daily.   amoxicillin 500 MG tablet Commonly known as:  AMOXIL Take 500 mg by mouth once. Take 4 tablets one hour prior to dental work   AUVI-Q 0.3 mg/0.3 mL Soaj injection Generic drug:  EPINEPHrine Inject 0.3 mg into the muscle once.   b complex vitamins tablet Take 1 tablet by mouth daily.   CALCIUM 600 + D PO Take 1 tablet by mouth daily.   calcium carbonate 500 MG chewable tablet Commonly known as:  TUMS - dosed in mg elemental calcium Chew 1-3 tablets by mouth 2 (two) times daily as needed for indigestion or heartburn.   cholestyramine light 4 GM/DOSE powder Commonly known as:  PREVALITE Take 4 g by mouth daily as needed (GI distress).   CORICIDIN COUGH/COLD 4-30 MG Tabs Generic drug:  Chlorpheniramine-DM Take 1 tablet by mouth 2 (two) times daily as needed (cough / cold symptoms).   [employer] TUSSIN DM 10-100 MG/5ML liquid Generic drug:  dextromethorphan-guaiFENesin Take 10 mLs by mouth every 4 (four) hours as needed for cough.   docusate sodium 100 MG  capsule Commonly known as:  COLACE Take 100 mg by mouth daily.   enoxaparin 30 MG/0.3ML injection Commonly known as:  LOVENOX Inject 0.3 mLs (30 mg total) into the skin every 12 (twelve) hours for 14 days. Start taking on:  10/31/2017   gabapentin 300 MG capsule Commonly known as:  NEURONTIN Take 300 mg by mouth 2 (two) times daily.   guaiFENesin-codeine 100-10 MG/5ML syrup Take 10 mLs by mouth every 4 (four) hours as needed for cough (for allergies, takes along with coricidin).   L-Lysine 500 MG  Caps Take 500 mg by mouth daily.   loratadine 10 MG tablet Commonly known as:  CLARITIN Take 10 mg by mouth daily.   loratadine-pseudoephedrine 5-120 MG tablet Commonly known as:  CLARITIN-D 12-hour Take 1 tablet by mouth daily as needed for allergies.   Melatonin 3 MG Tabs Take 3 mg by mouth at bedtime.   multivitamin tablet Take 1 tablet by mouth daily.   omeprazole 20 MG capsule Commonly known as:  PRILOSEC Take 20 mg by mouth daily.   oxyCODONE 5 MG immediate release tablet Commonly known as:  Oxy IR/ROXICODONE Take 1 tablet (5 mg total) by mouth every 4 (four) hours as needed for moderate pain (pain score 4-6).   polyvinyl alcohol 1.4 % ophthalmic solution Commonly known as:  LIQUIFILM TEARS Place 1 drop into both eyes as needed for dry eyes.   potassium chloride 10 MEQ tablet Commonly known as:  K-DUR Take 10 mEq by mouth daily.   pramipexole 0.125 MG tablet Commonly known as:  MIRAPEX Take 0.125 mg by mouth at bedtime.   sodium chloride 0.65 % Soln nasal spray Commonly known as:  OCEAN Place 1 spray into both nostrils as needed for congestion.   SPIRIVA HANDIHALER 18 MCG inhalation capsule Generic drug:  tiotropium Place 1 capsule into inhaler and inhale daily.   torsemide 20 MG tablet Commonly known as:  DEMADEX Take 20 mg by mouth daily.   traMADol 50 MG tablet Commonly known as:  ULTRAM Take 1-2 tablets (50-100 mg total) by mouth every 4 (four)  hours as needed for moderate pain. What changed:  when to take this   traMADol 50 MG tablet Commonly known as:  ULTRAM Take 1-2 tablets (50-100 mg total) by mouth every 4 (four) hours as needed for moderate pain. Start taking on:  10/30/2017 What changed:  You were already taking a medication with the same name, and this prescription was added. Make sure you understand how and when to take each.   valACYclovir 1000 MG tablet Commonly known as:  VALTREX Take 2,000 mg by mouth 2 (two) times daily as needed (fever blisters). Take for only 1 day for fever blister symptoms            Durable Medical Equipment  (From admission, onward)        Start     Ordered   10/28/17 1302  DME Walker rolling  Once    Question:  Patient needs a walker to treat with the following condition  Answer:  Total knee replacement status   10/28/17 1301   10/28/17 1302  DME Bedside commode  Once    Question:  Patient needs a bedside commode to treat with the following condition  Answer:  Total knee replacement status   10/28/17 1301      Diagnostic Studies: Dg Knee Right Port  Result Date: 10/28/2017 CLINICAL DATA:  RIGHT knee postop. EXAM: PORTABLE RIGHT KNEE - 1-2 VIEW COMPARISON:  None. FINDINGS: Status post total knee arthroplasty. Fluid and air are identified within the soft tissues and the joint space. Surgical drain is identified in the suprapatellar region. Normal alignment. IMPRESSION: Status post total knee arthroplasty.  No adverse features. Electronically Signed   By: Norva Pavlov M.D.   On: 10/28/2017 12:21   US Thyroid  Result Date: 10/22/2017 CLINICAL DATA:  Goiter.  Nontoxic multinodular goiter EXAM: THYROID ULTRASOUND TECHNIQUE: Ultrasound examination of the thyroid gland and adjacent soft tissues was performed. COMPARISON:  None. FINDINGS: Parenchymal Echotexture: Moderately heterogenous  Isthmus: 0.6 cm Right lobe: 5.4 x 2.8 x 3.2 cm Left lobe: There is no tissue in the left thyroid  bed. _________________________________________________________ Estimated total number of nodules >/= 1 cm: 1 Number of spongiform nodules >/=  2 cm not described below (TR1): 0 Number of mixed cystic and solid nodules >/= 1.5 cm not described below (TR2): 0 _________________________________________________________ Nodule # 1: Location: Right; Mid Maximum size: 3.2 cm; Other 2 dimensions: 3.0 x 2.5 cm Composition: mixed cystic and solid (1) Echogenicity: isoechoic (1) Shape: not taller-than-wide (0) Margins: smooth (0) Echogenic foci: none (0) ACR TI-RADS total points: 2. ACR TI-RADS risk category: TR2 (2 points). ACR TI-RADS recommendations: This nodule does NOT meet TI-RADS criteria for biopsy or dedicated follow-up. _________________________________________________________ IMPRESSION: Right nodule 1 does not meet criteria for biopsy nor follow-up There is no tissue in the left thyroid bed. Please correlate with history of thyroid surgery. The above is in keeping with the ACR TI-RADS recommendations - J Am Coll Radiol 2017;14:587-595. Electronically Signed   By: Jolaine Click M.D.   On: 10/22/2017 16:56    Disposition:   Discharge Instructions    Increase activity slowly   Complete by:  As directed       Follow-up Information    Tera Partridge, PA On 11/12/2017.   Specialty:  Physician Assistant Why:  at 9:15am Contact information: [address] [facility] [place] [address] [phone]        Donato Heinz, MD On 12/10/2017.   Specialty:  Orthopedic Surgery Why:  at 1:30pm Contact information: [address] [facility] [place] [address] [phone]            Signed: Tera Partridge. 10/29/2017, 7:25 AM

## 2017-10-29 NOTE — Progress Notes (Signed)
Physical Therapy Treatment Patient Details Name: Deborah Miller MRN: [id number] DOB: [birth year] Today's Date: 10/29/2017    History of Present Illness 62yo female pt underwent elective R TKR without reported post-op complications. PT evaluation perform on POD#0 and pt reports mostly full recovery of sensation to RLE. PMH inclues depression, anxiety, OA, DM, low back pain and HTN    PT Comments    Participated in exercises as described below.  Stood with mig guard.  She was able to take 2 short walks with min guard and wheelchair follow as she fatigues quickly.  She was premedicated 20-30 minutes before session but self limited stretching, ex and gait due to pain.  No LOB or buckling noted.  Remained in recliner after session.     Follow Up Recommendations  SNF     Equipment Recommendations       Recommendations for Other Services       Precautions / Restrictions Precautions Precautions: Knee Required Braces or Orthoses: Knee Immobilizer - Right Knee Immobilizer - Right: Discontinue once straight leg raise with < 10 degree lag Restrictions Weight Bearing Restrictions: Yes RLE Weight Bearing: Weight bearing as tolerated Other Position/Activity Restrictions: KI not used as she was able to complete SLR <10 lag and no buckling noted during gait.    Mobility  Bed Mobility Overal bed mobility: Needs Assistance Bed Mobility: Supine to Sit     Supine to sit: Supervision     General bed mobility comments: in recliner  Transfers Overall transfer level: Needs assistance Equipment used: Rolling walker (2 wheeled) Transfers: Sit to/from Stand Sit to Stand: Min guard         General transfer comment: VC for hand placement on RW  Ambulation/Gait Ambulation/Gait assistance: Min guard Gait Distance (Feet): 5 Feet Assistive device: Rolling walker (2 wheeled) Gait Pattern/deviations: Step-to pattern Gait velocity: Below functional limits for household mobility currently    General Gait Details: 5' x 2 - required seated rest.   Stairs             Wheelchair Mobility    Modified Rankin (Stroke Patients Only)       Balance Overall balance assessment: Needs assistance Sitting-balance support: No upper extremity supported Sitting balance-Leahy Scale: Good     Standing balance support: Bilateral upper extremity supported Standing balance-Leahy Scale: Fair                              Cognition Arousal/Alertness: Awake/alert Behavior During Therapy: WFL for tasks assessed/performed Overall Cognitive Status: Within Functional Limits for tasks assessed                                        Exercises Total Joint Exercises Ankle Circles/Pumps: Both;15 reps Quad Sets: Both;15 reps Heel Slides: Right;10 reps;Supine Hip ABduction/ADduction: Right;10 reps;Supine Straight Leg Raises: Right;10 reps;Supine Long Arc Quad: 10 reps;Right;Seated Knee Flexion: 5 reps;Seated;Right;AAROM Goniometric ROM: 0-70 - self limited despite pain meds prior.  Pt stated she thougth a staple was pulling on her skin increasing her pain.  Unable to observe due to bulky dressing remaining.  Refused further stretching. Other Exercises Other Exercises: pt instructed in falls prevention strategies, polar care mgt, compression stocking mgt, and home/routines modifications to maximize safety and independence at home    General Comments        Pertinent Vitals/Pain Pain Assessment:  Faces Pain Score: 8  Faces Pain Scale: Hurts even more Pain Location: R knee Pain Descriptors / Indicators: Operative site guarding Pain Intervention(s): Premedicated before session;Monitored during session;Limited activity within patient's tolerance;Ice applied    Home Living Family/patient expects to be discharged to:: Skilled nursing facility Living Arrangements: Alone Available Help at Discharge: Family;Available PRN/intermittently Type of Home:  House Home Access: Stairs to enter Entrance Stairs-Rails: Left Home Layout: One level Home Equipment: Walker - 2 wheels;Cane - single point;Bedside commode;Tub bench      Prior Function Level of Independence: Independent with assistive device(s)      Comments: Infrequent use of single point cane. 1 fall in the last 12 months. Indep with ADL and IADL, however reports difficulty with household cleaning tasks   PT Goals (current goals can now be found in the care plan section) Acute Rehab PT Goals Patient Stated Goal: go to rehab and then return home with improved safety and independence and less pain Progress towards PT goals: Progressing toward goals    Frequency    BID      PT Plan Current plan remains appropriate    Co-evaluation              AM-PAC PT "6 Clicks" Daily Activity  Outcome Measure  Difficulty turning over in bed (including adjusting bedclothes, sheets and blankets)?: A Little Difficulty moving from lying on back to sitting on the side of the bed? : A Little Difficulty sitting down on and standing up from a chair with arms (e.g., wheelchair, bedside commode, etc,.)?: A Little Help needed moving to and from a bed to chair (including a wheelchair)?: A Little Help needed walking in hospital room?: A Little Help needed climbing 3-5 steps with a railing? : Total 6 Click Score: 16    End of Session Equipment Utilized During Treatment: Gait belt Activity Tolerance: Patient tolerated treatment well Patient left: with call bell/phone within reach;in chair;with chair alarm set;with SCD's reapplied;Other (comment)   Pain - Right/Left: Right Pain - part of body: Knee     Time: 1610-9604 PT Time Calculation (min) (ACUTE ONLY): 23 min  Charges:  $Gait Training: 8-22 mins $Therapeutic Exercise: 8-22 mins                    G Codes:       Danielle Dess, PTA 10/29/17, 10:14 AM

## 2017-10-29 NOTE — Progress Notes (Signed)
   Subjective: 1 Day Post-Op Procedure(s) (LRB): COMPUTER ASSISTED TOTAL KNEE ARTHROPLASTY (Right) Patient reports pain as moderate.   Patient is well, and has had no acute complaints or problems Started therapy yesterday.  Was able to do bed to chair.  Range of motion 83 degrees of flexion Plan is to go Rehab after hospital stay. no nausea and no vomiting Patient denies any chest pains or shortness of breath. Objective: Vital signs in last 24 hours: Temp:  [97 F (36.1 C)-98.3 F (36.8 C)] 97.4 F (36.3 C) (06/27 0445) Pulse Rate:  [72-98] 72 (06/27 0445) Resp:  [14-20] 18 (06/27 0445) BP: (106-141)/(40-93) 106/40 (06/27 0445) SpO2:  [92 %-100 %] 100 % (06/27 0445) Heels are non tender and elevated off the bed using rolled towels as well as bone foam under operative heel Intake/Output from previous day: 06/26 0701 - 06/27 0700 In: 4730 [P.O.:720; I.V.:2941.7; IV Piggyback:1068.3] Out: 2635 [Urine:2405; Drains:130; Blood:100] Intake/Output this shift: No intake/output data recorded.  No results for input(s): HGB in the last 72 hours. No results for input(s): WBC, RBC, HCT, PLT in the last 72 hours. No results for input(s): NA, K, CL, CO2, BUN, CREATININE, GLUCOSE, CALCIUM in the last 72 hours. No results for input(s): LABPT, INR in the last 72 hours.  EXAM General - Patient is Alert, Appropriate and Oriented Extremity - Neurologically intact Neurovascular intact Sensation intact distally Intact pulses distally Dorsiflexion/Plantar flexion intact Compartment soft Dressing - dressing C/D/I Motor Function - intact, moving foot and toes well on exam.  Good quad tone but not able to do straight leg raise on her own.  Past Medical History:  Diagnosis Date  . Acid reflux   . Anxiety   . Arthritis   . Asthma 1957  . Breast microcalcification, mammographic 2014  . Breast screening, unspecified   . Carpal tunnel syndrome 1999   repaired left wrist  . Depression   .  Diabetes mellitus without complication (HCC) 2019   borderline, follows diabetic diet often  . Fecal smearing   . Headache    sinus migraines. takes OTC meds for allergies  . Heart murmur    as a child, not treated  . Hemorrhoids 2012  . History of hiatal hernia   . Hypertension   . Lump in thyroid 2019   having an ultrasound 10/2017 to rule out tumor or salivary gland enlargement  . Pain    chronic lbp,stenosis  . PONV (postoperative nausea and vomiting)    nausea and vomitting due to morphine  . Shortness of breath dyspnea    increased with anxiety  . Sleep apnea    cpap    Assessment/Plan: 1 Day Post-Op Procedure(s) (LRB): COMPUTER ASSISTED TOTAL KNEE ARTHROPLASTY (Right) Active Problems:   S/P total knee arthroplasty  Estimated body mass index is 46.77 kg/m as calculated from the following:   Height as of this encounter: 4' 7.5" (1.41 m).   Weight as of this encounter: 92.9 kg (204 lb 14.4 oz). Advance diet Up with therapy D/C IV fluids Plan for discharge tomorrow Discharge to SNF  Labs: None DVT Prophylaxis - Lovenox, Foot Pumps and TED hose Weight-Bearing as tolerated to right leg D/C O2 and Pulse OX and try on Room Air Begin working on bowel movement  [facility]fe PA [facility] 10/29/2017, 7:18 AM

## 2017-10-29 NOTE — Progress Notes (Addendum)
[facility] requested to see post op day 2 PT notes before they make a determination about SNF. Clinical Child psychotherapistocial Worker (CSW) made PT aware of above. Patient is aware of above.   Baker Hughes IncorporatedBailey Elbridge Magowan, LCSW [phone]

## 2017-10-29 NOTE — Clinical Social Work Placement (Signed)
   CLINICAL SOCIAL WORK PLACEMENT  NOTE  Date:  10/29/2017  Patient Details  Name: Hassell HalimMaryann C Thau MRN: [id number] Date of Birth: [birth year]  Clinical Social Work is seeking post-discharge placement for this patient at the Skilled  Nursing Facility level of care (*CSW will initial, date and re-position this form in  chart as items are completed):  Yes   Patient/family provided with [place] Clinical Social Work Department's list of facilities offering this level of care within the geographic area requested by the patient (or if unable, by the patient's family).  Yes   Patient/family informed of their freedom to choose among providers that offer the needed level of care, that participate in Medicare, Medicaid or managed care program needed by the patient, have an available bed and are willing to accept the patient.  Yes   Patient/family informed of [facility] ownership interest in [facility] and [facility], as well as of the fact that they are under no obligation to receive care at these facilities.  PASRR submitted to EDS on       PASRR number received on       Existing PASRR number confirmed on 10/29/17     FL2 transmitted to all facilities in geographic area requested by pt/family on 10/29/17     FL2 transmitted to all facilities within larger geographic area on       Patient informed that his/her managed care company has contracts with or will negotiate with certain facilities, including the following:        Yes   Patient/family informed of bed offers received.  Patient [facility] bed at [facility]     Physician recommends and patient chooses bed at      Patient to be transferred to   on  .  Patient to be transferred to facility by       Patient family notified on   of transfer.  Name of family member notified:        PHYSICIAN       Additional Comment:    _______________________________________________ Oddis Westling, Darleen CrockerBailey M, LCSW 10/29/2017, 9:27  AM

## 2017-10-29 NOTE — Evaluation (Signed)
Occupational Therapy Evaluation Patient Details Name: Deborah HalimMaryann C Hovatter MRN: [id number] DOB: 20-Feb-1956 Today's Date: 10/29/2017    History of Present Illness 62yo female pt underwent elective R TKR without reported post-op complications. PT evaluation perform on POD#0 and pt reports mostly full recovery of sensation to RLE. PMH inclues depression, anxiety, OA, DM, low back pain and HTN   Clinical Impression   Pt seen for OT evaluation this date, POD#1 from above surgery. Pt was independent in all ADLs prior to surgery, however occasionally using SPC for mobility due to R knee pain. Pt is eager to return to PLOF with less pain and improved safety and independence. Pt currently requires minimal assist for LB dressing while in seated position due to pain, limited AROM of R knee, and decreased RLE strength. Pt instructed in polar care mgt, falls prevention strategies, home/routines modifications, DME/AE for LB bathing and dressing tasks, and compression stocking mgt. Pt would benefit from skilled OT services including additional instruction in dressing techniques with or without assistive devices for dressing and bathing skills to support recall and carryover prior to discharge and ultimately to maximize safety, independence, and minimize falls risk and caregiver burden. Pt will benefit from skilled OT services to address noted impairments and functional deficits in mobility, bathing, dressing, and toileting. Recommend STR prior to return home to maximize return to PLOF, minimize falls risk, and minimize caregiver burden.      Follow Up Recommendations  SNF    Equipment Recommendations  None recommended by OT    Recommendations for Other Services       Precautions / Restrictions Precautions Precautions: Knee Required Braces or Orthoses: Knee Immobilizer - Right Knee Immobilizer - Right: Discontinue once straight leg raise with < 10 degree lag Restrictions Weight Bearing Restrictions: Yes RLE  Weight Bearing: Weight bearing as tolerated      Mobility Bed Mobility Overal bed mobility: Needs Assistance Bed Mobility: Supine to Sit     Supine to sit: Supervision     General bed mobility comments: additional effort and time 2:2 pain  Transfers Overall transfer level: Needs assistance Equipment used: Rolling walker (2 wheeled) Transfers: Sit to/from Stand Sit to Stand: Min guard         General transfer comment: VC for hand placement on RW    Balance Overall balance assessment: Needs assistance Sitting-balance support: No upper extremity supported Sitting balance-Leahy Scale: Good     Standing balance support: Bilateral upper extremity supported Standing balance-Leahy Scale: Fair                             ADL either performed or assessed with clinical judgement   ADL Overall ADL's : Needs assistance/impaired             Lower Body Bathing: Minimal assistance;Sit to/from stand Lower Body Bathing Details (indicate cue type and reason): instructed in AE for LB bathing to improve safety and independence     Lower Body Dressing: Minimal assistance;Sit to/from stand Lower Body Dressing Details (indicate cue type and reason): instructed in AE for LB dressing to improve safety and independence Toilet Transfer: BSC;RW;Ambulation;Min guard           Functional mobility during ADLs: Min guard;Rolling walker       Vision Baseline Vision/History: Wears glasses Wears Glasses: Distance only Patient Visual Report: No change from baseline       Perception     Praxis  Pertinent Vitals/Pain Pain Assessment: 0-10 Pain Score: 8  Pain Location: R knee Pain Descriptors / Indicators: Operative site guarding Pain Intervention(s): Limited activity within patient's tolerance;Monitored during session;Repositioned;Ice applied;RN gave pain meds during session;Patient requesting pain meds-RN notified     Hand Dominance Left   Extremity/Trunk  Assessment Upper Extremity Assessment Upper Extremity Assessment: Overall WFL for tasks assessed   Lower Extremity Assessment Lower Extremity Assessment: Defer to PT evaluation;RLE deficits/detail RLE Deficits / Details: full sensation, expected post-op strength/ROM deficits   Cervical / Trunk Assessment Cervical / Trunk Assessment: Normal   Communication Communication Communication: No difficulties   Cognition Arousal/Alertness: Awake/alert Behavior During Therapy: WFL for tasks assessed/performed Overall Cognitive Status: Within Functional Limits for tasks assessed                                     General Comments       Exercises Other Exercises Other Exercises: pt instructed in falls prevention strategies, polar care mgt, compression stocking mgt, and home/routines modifications to maximize safety and independence at home   Shoulder Instructions      Home Living Family/patient expects to be discharged to:: Skilled nursing facility Living Arrangements: Alone Available Help at Discharge: Family;Available PRN/intermittently Type of Home: House Home Access: Stairs to enter [employer] of Steps: 3(4 steps in back with bilateral wide rails) Entrance Stairs-Rails: Left Home Layout: One level     Bathroom Shower/Tub: Chief Strategy Officer: Handicapped height     Home Equipment: Environmental consultant - 2 wheels;Cane - single point;Bedside commode;Tub bench          Prior Functioning/Environment Level of Independence: Independent with assistive device(s)        Comments: Infrequent use of single point cane. 1 fall in the last 12 months. Indep with ADL and IADL, however reports difficulty with household cleaning tasks        OT Problem List: Decreased strength;Decreased knowledge of use of DME or AE;Pain;Impaired balance (sitting and/or standing);Decreased range of motion      OT Treatment/Interventions: Self-care/ADL training;Balance  training;Therapeutic exercise;Therapeutic activities;DME and/or AE instruction;Patient/family education    OT Goals(Current goals can be found in the care plan section) Acute Rehab OT Goals Patient Stated Goal: go to rehab and then return home with improved safety and independence and less pain OT Goal Formulation: With patient Time For Goal Achievement: 11/12/17 Potential to Achieve Goals: Good ADL Goals Pt Will Perform Lower Body Dressing: with set-up;sit to/from stand;with adaptive equipment Pt Will Transfer to Toilet: with supervision;ambulating;regular height toilet(LRAD for amb)  OT Frequency: Min 1X/week   Barriers to D/C: Decreased caregiver support;Inaccessible home environment          Co-evaluation              AM-PAC PT "6 Clicks" Daily Activity     Outcome Measure Help from another person eating meals?: None Help from another person taking care of personal grooming?: A Little Help from another person toileting, which includes using toliet, bedpan, or urinal?: A Little Help from another person bathing (including washing, rinsing, drying)?: A Little Help from another person to put on and taking off regular upper body clothing?: A Little Help from another person to put on and taking off regular lower body clothing?: A Little 6 Click Score: 19   End of Session Equipment Utilized During Treatment: Gait belt;Rolling walker  Activity Tolerance: Patient tolerated treatment well Patient  left: in chair;with call bell/phone within reach;with chair alarm set;with SCD's reapplied;Other (comment)(polar care in place)  OT Visit Diagnosis: Other abnormalities of gait and mobility (R26.89);Muscle weakness (generalized) (M62.81);Pain Pain - Right/Left: Right Pain - part of body: Knee                Time: 1610-9604 OT Time Calculation (min): 39 min Charges:  OT General Charges $OT Visit: 1 Visit OT Evaluation $OT Eval Moderate Complexity: 1 Mod OT Treatments $Self  Care/Home Management : 23-37 mins  Richrd Prime, MPH, MS, OTR/L ascom [phone] 10/29/17, 10:09 AM

## 2017-10-29 NOTE — Anesthesia Postprocedure Evaluation (Signed)
Anesthesia Post Note  Patient: Deborah HalimMaryann C Miller  Procedure(s) Performed: COMPUTER ASSISTED TOTAL KNEE ARTHROPLASTY (Right )  Patient location during evaluation: Nursing Unit Anesthesia Type: Spinal Level of consciousness: awake, awake and alert, oriented and patient cooperative Pain management: pain level controlled Vital Signs Assessment: post-procedure vital signs reviewed and stable Respiratory status: spontaneous breathing, nonlabored ventilation and respiratory function stable Cardiovascular status: stable Postop Assessment: no headache, no backache, patient able to bend at knees, no apparent nausea or vomiting and adequate PO intake Anesthetic complications: no     Last Vitals:  Vitals:   10/29/17 0445 10/29/17 0742  BP: (!) 106/40 119/66  Pulse: 72 61  Resp: 18 18  Temp: (!) 36.3 C 36.9 C  SpO2: 100% 98%    Last Pain:  Vitals:   10/29/17 0723  TempSrc:   PainSc: 5                  Lashon Beringer,  Kaveon Blatz R

## 2017-10-30 LAB — GLUCOSE, CAPILLARY
GLUCOSE-CAPILLARY: 88 mg/dL (ref 70–99)
GLUCOSE-CAPILLARY: 125 mg/dL — AB (ref 70–99)

## 2017-10-30 LAB — URINALYSIS, COMPLETE (UACMP) WITH MICROSCOPIC
Bacteria, UA: NONE SEEN
Bilirubin Urine: NEGATIVE
Glucose, UA: NEGATIVE mg/dL
Ketones, ur: NEGATIVE mg/dL
Leukocytes, UA: NEGATIVE
Nitrite: NEGATIVE
Protein, ur: NEGATIVE mg/dL
SPECIFIC GRAVITY, URINE: 1.006 (ref 1.005–1.030)
Squamous Epithelial / LPF: NONE SEEN (ref 0–5)
pH: 5 (ref 5.0–8.0)

## 2017-10-30 MED ORDER — LACTULOSE 10 GM/15ML PO SOLN: 10.0000 g | Freq: Two times a day (BID) | ORAL | Status: DC | PRN

## 2017-10-30 NOTE — Progress Notes (Signed)
Physical Therapy Treatment Patient Details Name: Deborah Miller MRN: [id number] DOB: Aug 26, 1955 Today's Date: 10/30/2017    History of Present Illness 62yo female pt underwent elective R TKR without reported post-op complications. PT evaluation perform on POD#0 and pt reports mostly full recovery of sensation to RLE. PMH inclues depression, anxiety, OA, DM, low back pain and HTN    PT Comments    Patient with good efforts throughout session, demonstrating gradual progression in gait distance and overall functional ability (10' with RW, min assist).  Constant cuing for mechanics, sequencing and overall active use of R LE with all mobility tasks, but good efforts to integrate cuing as able. Remains unable to demonstrate ability to complete mobility tasks (or distance) required to facilitate safe discharge home alone.  Continue to recommend transition to STR at discharge.    Follow Up Recommendations  SNF     Equipment Recommendations       Recommendations for Other Services       Precautions / Restrictions Precautions Precautions: Knee;Fall Restrictions Weight Bearing Restrictions: Yes RLE Weight Bearing: Weight bearing as tolerated    Mobility  Bed Mobility               General bed mobility comments: seated on BSC upon arrival to session  Transfers Overall transfer level: Needs assistance Equipment used: Rolling walker (2 wheeled) Transfers: Sit to/from Stand Sit to Stand: Min guard;Min assist         General transfer comment: cuing for hand placement, very limited use of R LE with movement transition  Ambulation/Gait Ambulation/Gait assistance: Min assist Gait Distance (Feet): 10 Feet(x2) Assistive device: Rolling walker (2 wheeled)       General Gait Details: 3-point, step to gait pattern; cuing for cadence, gait speed and increased stance time/WBing R LE.  Very slow and deliberate; additional distance limited by fatigue/endurance   Stairs              Wheelchair Mobility    Modified Rankin (Stroke Patients Only)       Balance Overall balance assessment: Needs assistance Sitting-balance support: No upper extremity supported;Feet supported Sitting balance-Leahy Scale: Good     Standing balance support: Bilateral upper extremity supported Standing balance-Leahy Scale: Fair                              Cognition Arousal/Alertness: Awake/alert Behavior During Therapy: WFL for tasks assessed/performed Overall Cognitive Status: Within Functional Limits for tasks assessed                                        Exercises Total Joint Exercises Goniometric ROM: R knee: 0-76 degrees Other Exercises Other Exercises: Toilet transfer, SPT with RW, cga/min assist; dep assist for hygiene    General Comments        Pertinent Vitals/Pain Pain Assessment: Faces Faces Pain Scale: Hurts little more Pain Location: R knee Pain Descriptors / Indicators: Operative site guarding;Sore Pain Intervention(s): Limited activity within patient's tolerance;Monitored during session;Repositioned;Premedicated before session    Home Living                      Prior Function            PT Goals (current goals can now be found in the care plan section) Acute Rehab PT Goals Patient Stated Goal:  go to rehab and then return home with improved safety and independence and less pain PT Goal Formulation: With patient Time For Goal Achievement: 11/11/17 Potential to Achieve Goals: Good Progress towards PT goals: Progressing toward goals    Frequency    BID      PT Plan Current plan remains appropriate    Co-evaluation              AM-PAC PT "6 Clicks" Daily Activity  Outcome Measure  Difficulty turning over in bed (including adjusting bedclothes, sheets and blankets)?: A Little Difficulty moving from lying on back to sitting on the side of the bed? : A Little Difficulty sitting down on  and standing up from a chair with arms (e.g., wheelchair, bedside commode, etc,.)?: Unable Help needed moving to and from a bed to chair (including a wheelchair)?: A Little Help needed walking in hospital room?: A Little Help needed climbing 3-5 steps with a railing? : Total 6 Click Score: 14    End of Session Equipment Utilized During Treatment: Gait belt Activity Tolerance: Patient tolerated treatment well Patient left: in chair;with call bell/phone within reach;with chair alarm set Nurse Communication: Mobility status PT Visit Diagnosis: Muscle weakness (generalized) (M62.81);Pain;Difficulty in walking, not elsewhere classified (R26.2) Pain - Right/Left: Right Pain - part of body: Knee     Time: 1004-1020 PT Time Calculation (min) (ACUTE ONLY): 16 min  Charges:  $Gait Training: 8-22 mins                    G Codes:       Ashlen Kiger H. Manson Passey, PT, DPT, NCS 10/30/17, 11:13 AM [phone]

## 2017-10-30 NOTE — Progress Notes (Signed)
Foley placed for retention per MD. VSS. IV removed. RN called report to [facility]. EMS called to transport the pt.

## 2017-10-30 NOTE — Progress Notes (Signed)
Patient is medically stable for D[facility] to [facility]. [facility] authorization has been received and patient can come today to room 215. RN will call report at [phone] and arrange EMS for transport. Clinical Child psychotherapistocial Worker (CSW) sent D/C orders to [facility] via [employer]. Patient is aware of above. Patient's sister Bonita QuinLinda is at bedside and aware of above. Please reconsult if future social work needs arise. CSW signing off.   [employer]aker Hughes [employer]Bailey Omarion Minnehan, LCSW [phone]

## 2017-10-30 NOTE — Progress Notes (Signed)
   Subjective: 2 Days Post-Op Procedure(s) (LRB): COMPUTER ASSISTED TOTAL KNEE ARTHROPLASTY (Right) Patient reports pain as 5 on 0-10 scale.   Patient is well, and has had no acute complaints or problems Patient did not do all that well with physical therapy yesterday.  Was only able to ambulate about 6 feet.  Range of motion was 0 to 70 degrees. Plan is to go Rehab after hospital stay. no nausea and no vomiting Patient slept most of the night. Patient denies any chest pains or shortness of breath. Objective: Vital signs in last 24 hours: Temp:  [98 F (36.7 C)-98.4 F (36.9 C)] 98.2 F (36.8 C) (06/27 2322) Pulse Rate:  [61-80] 67 (06/27 2322) Resp:  [18-20] 18 (06/27 2322) BP: (110-134)/(61-76) 110/64 (06/27 2322) SpO2:  [97 %-100 %] 97 % (06/27 2322) well approximated incision Heels are non tender and elevated off the bed using rolled towels Intake/Output from previous day: 06/27 0701 - 06/28 0700 In: 480 [P.O.:480] Out: 60 [Drains:60] Intake/Output this shift: No intake/output data recorded.  No results for input(s): HGB in the last 72 hours. No results for input(s): WBC, RBC, HCT, PLT in the last 72 hours. No results for input(s): NA, K, CL, CO2, BUN, CREATININE, GLUCOSE, CALCIUM in the last 72 hours. No results for input(s): LABPT, INR in the last 72 hours.  EXAM General - Patient is Alert, Appropriate and Oriented Extremity - Neurologically intact Neurovascular intact Sensation intact distally Intact pulses distally Dorsiflexion/Plantar flexion intact No cellulitis present Compartment soft Dressing - dressing C/D/I Motor Function - intact, moving foot and toes well on exam.    Past Medical History:  Diagnosis Date  . Acid reflux   . Anxiety   . Arthritis   . Asthma 1957  . Breast microcalcification, mammographic 2014  . Breast screening, unspecified   . Carpal tunnel syndrome 1999   repaired left wrist  . Depression   . Diabetes mellitus without  complication (HCC) 2019   borderline, follows diabetic diet often  . Fecal smearing   . Headache    sinus migraines. takes OTC meds for allergies  . Heart murmur    as a child, not treated  . Hemorrhoids 2012  . History of hiatal hernia   . Hypertension   . Lump in thyroid 2019   having an ultrasound 10/2017 to rule out tumor or salivary gland enlargement  . Pain    chronic lbp,stenosis  . PONV (postoperative nausea and vomiting)    nausea and vomitting due to morphine  . Shortness of breath dyspnea    increased with anxiety  . Sleep apnea    cpap    Assessment/Plan: 2 Days Post-Op Procedure(s) (LRB): COMPUTER ASSISTED TOTAL KNEE ARTHROPLASTY (Right) Active Problems:   S/P total knee arthroplasty  Estimated body mass index is 46.77 kg/m as calculated from the following:   Height as of this encounter: 4' 7.5" (1.41 m).   Weight as of this encounter: 92.9 kg (204 lb 14.4 oz). Up with therapy Discharge to SNF when bed is available  Labs: None DVT Prophylaxis - Lovenox, Foot Pumps and TED hose Weight-Bearing as tolerated to right leg Hemovac discontinued this morning.  In the drains appear to be intact. Patient needs a bowel movement. Please wash operative leg, change dressing and apply TED stockings to both legs prior to discharge   Deborah Laramee R. [facility] PA [facility] 10/30/2017, 7:21 AM

## 2017-10-30 NOTE — Clinical Social Work Placement (Signed)
   CLINICAL SOCIAL WORK PLACEMENT  NOTE  Date:  10/30/2017  Patient Details  Name: Deborah Miller MRN: [id number] Date of Birth: February 16, 1956  Clinical Social Work is seeking post-discharge placement for this patient at the Skilled  Nursing Facility level of care (*CSW will initial, date and re-position this form in  chart as items are completed):  Yes   Patient/family provided with [place] Clinical Social Work Department's list of facilities offering this level of care within the geographic area requested by the patient (or if unable, by the patient's family).  Yes   Patient/family informed of their freedom to choose among providers that offer the needed level of care, that participate in Medicare, Medicaid or managed care program needed by the patient, have an available bed and are willing to accept the patient.  Yes   Patient/family informed of [facility]'s ownership interest in [facility] and [facility], as well as of the fact that they are under no obligation to receive care at these facilities.  PASRR submitted to EDS on       PASRR number received on       Existing PASRR number confirmed on 10/29/17     FL2 transmitted to all facilities in geographic area requested by pt/family on 10/29/17     FL2 transmitted to all facilities within larger geographic area on       Patient informed that his/her managed care company has contracts with or will negotiate with certain facilities, including the following:        Yes   Patient/family informed of bed offers received.  Patient [facility]     Physician recommends and patient chooses bed at      Patient to be transferred to [facility] ) on 10/30/17.  Patient to be transferred to facility by [facility]     Patient family notified on 10/30/17 of transfer.  Name of family member notified:  (Patient's sister Bonita QuinLinda is at bedside and aware of D/C today. )     PHYSICIAN        Additional Comment:    _______________________________________________ Refugia Laneve, Darleen CrockerBailey M, LCSW 10/30/2017, 1:47 PM

## 2017-11-02 ENCOUNTER — Encounter
Admission: RE | Admit: 2017-11-02 | Discharge: 2017-11-02 | Disposition: A | Discharge status: Home / Self Care | Payer: BC Managed Care – PPO | Source: Ambulatory Visit | Attending: Internal Medicine | Admitting: Internal Medicine

## 2017-11-02 ENCOUNTER — Other Ambulatory Visit: Payer: Self-pay

## 2017-11-02 MED ORDER — TRAMADOL HCL 50 MG PO TABS: 50.0000 mg | ORAL_TABLET | ORAL | 0 refills | Status: DC | PRN

## 2017-11-02 MED ORDER — ALPRAZOLAM 0.25 MG PO TABS: 0.2500 mg | ORAL_TABLET | Freq: Every day | ORAL | 0 refills | Status: DC

## 2017-11-02 NOTE — Telephone Encounter (Signed)
Rx sent to [facility] phone : [phone] , fax : [phone]  

## 2017-11-09 ENCOUNTER — Other Ambulatory Visit: Payer: Self-pay

## 2017-11-09 MED ORDER — TRAMADOL HCL 50 MG PO TABS: 50.0000 mg | ORAL_TABLET | ORAL | 0 refills | Status: DC | PRN

## 2017-11-09 NOTE — Telephone Encounter (Signed)
Rx sent to [facility] phone : [phone] , fax : [phone]  

## 2017-11-11 ENCOUNTER — Encounter: Payer: Self-pay | Admitting: Adult Health

## 2017-11-11 ENCOUNTER — Non-Acute Institutional Stay (SKILLED_NURSING_FACILITY): Payer: BC Managed Care – PPO | Admitting: Adult Health

## 2017-11-11 DIAGNOSIS — I10 Essential (primary) hypertension: Secondary | ICD-10-CM | POA: Diagnosis not present

## 2017-11-11 DIAGNOSIS — M1711 Unilateral primary osteoarthritis, right knee: Secondary | ICD-10-CM

## 2017-11-11 DIAGNOSIS — G629 Polyneuropathy, unspecified: Secondary | ICD-10-CM

## 2017-11-11 DIAGNOSIS — K219 Gastro-esophageal reflux disease without esophagitis: Secondary | ICD-10-CM

## 2017-11-11 DIAGNOSIS — E876 Hypokalemia: Secondary | ICD-10-CM

## 2017-11-11 DIAGNOSIS — J45909 Unspecified asthma, uncomplicated: Secondary | ICD-10-CM

## 2017-11-11 NOTE — Progress Notes (Signed)
Location:  [facility] Room Number: 215A Place of Service:  SNF (31) Provider:  Kenard Gower, NP  Patient Care Team: Danella Penton, MD as PCP - General (Unknown Physician Specialty) Lemar Livings Merrily Pew, MD (General Surgery) Towana Badger, MD (Unknown Physician Specialty) Defrancesco, Prentice Docker, MD as Consulting Physician (Obstetrics and Gynecology)  Extended Emergency Contact Information Primary Emergency Contact: Roberto Scales of [place] Home Phone: [phone] Relation: Sister Secondary Emergency Contact: Mitchell,Linda Home Phone: [phone] Mobile Phone: [phone] Relation: Sister  Code Status:  FULL Code  Goals of care: Advanced Directive information Advanced Directives 11/11/2017  Does Patient Have a Medical Advance Directive? Yes  Type of Estate agent of [place];Living will  Does patient want to make changes to medical advance directive? No - Patient declined  Copy of Healthcare Power of Attorney in Chart? Yes     Chief Complaint  Patient presents with  . Discharge Note    Discharged from SNF    HPI:  Pt is a 62 y.o. female seen today for discharge.  She is to discharge home on 11/13/17 with outpatient PT and OT.   She has been admitted to [facility] on 10/30/17 from the hospital for primary osteoarthritis of right knee for which she had computer assisted total knee arthroplasty on 10/28/17. She has a PMH of spinal stenosis, cervical disc disease at C4-5, type 2 DM, hypokalemia, hypertension, cor pulmonale, OSA, elevated BMI, and GERD.          Patient was admitted to this facility for short-term rehabilitation after the patient's recent hospitalization. Patient has completed SNF rehabilitation and therapy has cleared the patient for discharge.    Past Medical History:  Diagnosis Date  . Acid reflux   . Anxiety   . Arthritis   . Asthma 1957  . Breast  microcalcification, mammographic 2014  . Breast screening, unspecified   . Carpal tunnel syndrome 1999   repaired left wrist  . Depression   . Diabetes mellitus without complication (HCC) 2019   borderline, follows diabetic diet often  . Fecal smearing   . Headache    sinus migraines. takes OTC meds for allergies  . Heart murmur    as a child, not treated  . Hemorrhoids 2012  . History of hiatal hernia   . Hypertension   . Lump in thyroid 2019   having an ultrasound 10/2017 to rule out tumor or salivary gland enlargement  . Pain    chronic lbp,stenosis  . PONV (postoperative nausea and vomiting)    nausea and vomitting due to morphine  . Shortness of breath dyspnea    increased with anxiety  . Sleep apnea    cpap   Past Surgical History:  Procedure Laterality Date  . BACK SURGERY  2010   C 5 & 6 fusion  . BREAST EXCISIONAL BIOPSY Left 2014   Stereo, benign   . BUNIONECTOMY Left 1996   pin removed  . carpal tunnel--left hand  Left 2012  . CHOLECYSTECTOMY  2002  . COLONOSCOPY  2009   Dr. Mechele Collin  . COLONOSCOPY WITH PROPOFOL N/A 08/20/2016   Procedure: COLONOSCOPY WITH PROPOFOL;  Surgeon: Earline Mayotte, MD;  Location: [facility];  Service: Endoscopy;  Laterality: N/A;  . DILATION AND CURETTAGE OF UTERUS  1987  . ganglion cyst removal  Left 1988  . JOINT REPLACEMENT Right 2016   TOTAL HIP ARTHROPLASTY by dr. Ernest Pine  . KNEE ARTHROPLASTY Right  10/28/2017   Procedure: COMPUTER ASSISTED TOTAL KNEE ARTHROPLASTY;  Surgeon: Donato Heinz, MD;  Location: [facility] ORS;  Service: Orthopedics;  Laterality: Right;  . KNEE SURGERY Left 2014   partial knee replacement by dr. Erin Sons  . left breast biopsy Left 2006   mammary glands swollen  . NASAL SEPTUM SURGERY  1975   deviated septum repaired  . neck injection Right 2014   block for inflammed nerve. repeated in 2018 on left side for same thing. done by dr. Lowella Dandy  . NOSE SURGERY  2001   clean out of nasal passages    . TONSILLECTOMY  1991   uvula removed also for OSA  . tonsilloadenoidectomy  2003   uvula also for OSA. titanium screw on bottom of lower jaw  . TOTAL HIP ARTHROPLASTY Right 02/14/2015   Procedure: TOTAL HIP ARTHROPLASTY;  Surgeon: Donato Heinz, MD;  Location: [facility] ORS;  Service: Orthopedics;  Laterality: Right;  . TUBAL LIGATION    . UPPER GI ENDOSCOPY  2009, 2015   Dr Mechele Collin / Dr Lemar Livings    Allergies  Allergen Reactions  . Citrus     Itchy throat   . Pork-Derived Products     Itchy throat   . Augmentin [Amoxicillin-Pot Clavulanate] Nausea And Vomiting    Has patient had a PCN reaction causing immediate rash, facial/tongue/throat swelling, SOB or lightheadedness with hypotension: No Has patient had a PCN reaction causing severe rash involving mucus membranes or skin necrosis: No Has patient had a PCN reaction that required hospitalization: No Has patient had a PCN reaction occurring within the last 10 years: Yes If all of the above answers are "NO", then may proceed with Cephalosporin use.   . Latex Other (See Comments)    Blisters NOTE: Latex IgE NEGATIVE (<0.10) Around mouth causes a rash  . Morphine And Related Nausea And Vomiting    Patient does not want morphine at all.  . Cefadroxil Other (See Comments)    Severe cramping in side. Felt like ribs were going to explode  . Other Other (See Comments)    Lettuce causes her to start with diverticulitis  . Pollen Extract Other (See Comments)    Trees, grass, mold, dust causes raspy voice, nasal irritation  . Tape Other (See Comments)    Blisters. Paper tape is acceptable    Outpatient Encounter Medications as of 11/11/2017  Medication Sig  . acetaminophen (TYLENOL) 500 MG tablet Take 650 mg by mouth every 6 (six) hours as needed for moderate pain or headache. If no relief of pain within one hour, patient takes a second one.  Marland Kitchen ADVAIR HFA 115-21 MCG/ACT inhaler Inhale 2 puffs into the lungs 2 (two) times daily.   Marland Kitchen  albuterol (PROVENTIL HFA;VENTOLIN HFA) 108 (90 BASE) MCG/ACT inhaler Inhale 2 puffs into the lungs every 6 (six) hours as needed for wheezing or shortness of breath.   . ALPRAZolam (XANAX) 0.25 MG tablet Take 1 tablet (0.25 mg total) by mouth daily.  Marland Kitchen b complex vitamins tablet Take 1 tablet by mouth daily.  . calcium carbonate (TUMS - DOSED IN MG ELEMENTAL CALCIUM) 500 MG chewable tablet Chew 1-3 tablets by mouth 2 (two) times daily as needed for indigestion or heartburn.   . Calcium Carbonate-Vitamin D (CALCIUM 600 + D PO) Take 1 tablet by mouth daily.  . Chlorpheniramine-DM (CORICIDIN COUGH/COLD) 4-30 MG TABS Take 1 tablet by mouth 2 (two) times daily as needed (cough / cold symptoms).  . cholestyramine light (  PREVALITE) 4 GM/DOSE powder Take 4 g by mouth daily as needed (GI distress).   Marland Kitchen. docusate sodium (COLACE) 100 MG capsule Take 100 mg by mouth daily.   Marland Kitchen. enoxaparin (LOVENOX) 30 MG/0.3ML injection Inject 0.3 mLs (30 mg total) into the skin every 12 (twelve) hours for 14 days.  Marland Kitchen. EPINEPHrine (AUVI-Q) 0.3 mg/0.3 mL IJ SOAJ injection Inject 0.3 mg into the muscle daily as needed.   . gabapentin (NEURONTIN) 300 MG capsule Take 300 mg by mouth 2 (two) times daily.   Marland Kitchen. L-Lysine 500 MG CAPS Take 500 mg by mouth daily.   Marland Kitchen. loratadine (CLARITIN) 10 MG tablet Take 10 mg by mouth daily.   . Melatonin 3 MG TABS Take 3 mg by mouth at bedtime.   . Multiple Vitamin (MULTIVITAMIN) tablet Take 1 tablet by mouth daily.  Marland Kitchen. omeprazole (PRILOSEC) 20 MG capsule Take 20 mg by mouth daily.   Marland Kitchen. oxyCODONE (OXY IR/ROXICODONE) 5 MG immediate release tablet Take 1 tablet (5 mg total) by mouth every 4 (four) hours as needed for moderate pain (pain score 4-6).  . polyvinyl alcohol (LIQUIFILM TEARS) 1.4 % ophthalmic solution Place 1 drop into both eyes 3 (three) times daily as needed for dry eyes.   . potassium chloride (K-DUR) 10 MEQ tablet Take 10 mEq by mouth daily.   . pramipexole (MIRAPEX) 0.125 MG tablet Take 0.125  mg by mouth at bedtime.   . sodium chloride (OCEAN) 0.65 % SOLN nasal spray Place 1 spray into both nostrils every 2 (two) hours as needed for congestion.   Marland Kitchen. SPIRIVA HANDIHALER 18 MCG inhalation capsule Place 1 capsule into inhaler and inhale daily.  Marland Kitchen. torsemide (DEMADEX) 20 MG tablet Take 20 mg by mouth daily.   . traMADol (ULTRAM) 50 MG tablet Take 1-2 tablets (50-100 mg total) by mouth every 4 (four) hours as needed for moderate pain.  . valACYclovir (VALTREX) 1000 MG tablet Take 2,000 mg by mouth 2 (two) times daily as needed (fever blisters). Take for only 1 day for fever blister symptoms  . [DISCONTINUED] amoxicillin (AMOXIL) 500 MG tablet Take 500 mg by mouth once. Take 4 tablets one hour prior to dental work  . [DISCONTINUED] dextromethorphan-guaiFENesin ([employer] TUSSIN DM) 10-100 MG/5ML liquid Take 10 mLs by mouth every 4 (four) hours as needed for cough.  . [DISCONTINUED] guaiFENesin-codeine 100-10 MG/5ML syrup Take 10 mLs by mouth every 4 (four) hours as needed for cough (for allergies, takes along with coricidin).  . [DISCONTINUED] loratadine-pseudoephedrine (CLARITIN-D 12-HOUR) 5-120 MG tablet Take 1 tablet by mouth daily as needed for allergies.   No facility-administered encounter medications on file as of 11/11/2017.     Review of Systems  GENERAL: No change in appetite, no fatigue, no weight changes, no fever, chills or weakness MOUTH and THROAT: Denies oral discomfort, gingival pain or bleedin RESPIRATORY: no cough, SOB, DOE, wheezing, hemoptysis CARDIAC: No chest pain, edema or palpitations GI: No abdominal pain, diarrhea, constipation, heart burn, nausea or vomiting GU: Denies dysuria, frequency, hematuria, incontinence, or discharge PSYCHIATRIC: Denies feelings of depression or anxiety. No report of hallucinations, insomnia, paranoia, or agitation    Immunization History  Administered Date(s) Administered  . Influenza-Unspecified 02/18/2016, 02/17/2017  . Pneumococcal  Polysaccharide-23 02/15/2015   Pertinent  Health Maintenance Due  Topic Date Due  . FOOT EXAM  05/18/1965  . OPHTHALMOLOGY EXAM  05/18/1965  . URINE MICROALBUMIN  05/18/1965  . INFLUENZA VACCINE  12/03/2017  . HEMOGLOBIN A1C  04/16/2018  . PAP SMEAR  07/02/2019  . MAMMOGRAM  08/28/2019  . COLONOSCOPY  08/21/2026      Vitals:   11/11/17 1018  BP: 135/78  Pulse: 82  Resp: 16  Temp: 98.2 F (36.8 C)  TempSrc: Oral  SpO2: 100%  Weight: 207 lb 12.8 oz (94.3 kg)  Height: 4' 7.5" (1.41 m)   Body mass index is 47.43 kg/m.  Physical Exam  GENERAL APPEARANCE: Well nourished. In no acute distress. Morbidly obese SKIN:  Right knee surgical wound, dry and no erythema, covered with honeycomb dressing MOUTH and THROAT: Lips are without lesions. Oral mucosa is moist and without lesions. Tongue is normal in shape, size, and color and without lesions RESPIRATORY: Breathing is even & unlabored, BS CTAB CARDIAC: RRR, no murmur,no extra heart sounds, no edema GI: Abdomen soft, normal BS, no masses, no tenderness EXTREMITIES:  Able to move X 4 extremities PSYCHIATRIC: Alert and oriented X 3. Affect and behavior are appropriate   Labs reviewed: Recent Labs    09/18/17 1352 10/15/17 1237  NA  --  140  K  --  3.6  CL  --  102  CO2  --  26  GLUCOSE  --  145*  BUN  --  23*  CREATININE 0.72 0.81  CALCIUM  --  9.2   Recent Labs    10/15/17 1237  AST 29  ALT 27  ALKPHOS 77  BILITOT 0.8  PROT 7.9  ALBUMIN 4.4   Recent Labs    10/15/17 1237  WBC 10.0  HGB 14.6  HCT 43.8  MCV 88.2  PLT 246    Lab Results  Component Value Date   HGBA1C 6.5 (H) 10/15/2017   Significant Diagnostic Results in last 30 days:  Dg Knee Right Port  Result Date: 10/28/2017 CLINICAL DATA:  RIGHT knee postop. EXAM: PORTABLE RIGHT KNEE - 1-2 VIEW COMPARISON:  None. FINDINGS: Status post total knee arthroplasty. Fluid and air are identified within the soft tissues and the joint space. Surgical  drain is identified in the suprapatellar region. Normal alignment. IMPRESSION: Status post total knee arthroplasty.  No adverse features. Electronically Signed   By: Norva Pavlov M.D.   On: 10/28/2017 12:21   US Thyroid  Result Date: 10/22/2017 CLINICAL DATA:  Goiter.  Nontoxic multinodular goiter EXAM: THYROID ULTRASOUND TECHNIQUE: Ultrasound examination of the thyroid gland and adjacent soft tissues was performed. COMPARISON:  None. FINDINGS: Parenchymal Echotexture: Moderately heterogenous Isthmus: 0.6 cm Right lobe: 5.4 x 2.8 x 3.2 cm Left lobe: There is no tissue in the left thyroid bed. _________________________________________________________ Estimated total number of nodules >/= 1 cm: 1 Number of spongiform nodules >/=  2 cm not described below (TR1): 0 Number of mixed cystic and solid nodules >/= 1.5 cm not described below (TR2): 0 _________________________________________________________ Nodule # 1: Location: Right; Mid Maximum size: 3.2 cm; Other 2 dimensions: 3.0 x 2.5 cm Composition: mixed cystic and solid (1) Echogenicity: isoechoic (1) Shape: not taller-than-wide (0) Margins: smooth (0) Echogenic foci: none (0) ACR TI-RADS total points: 2. ACR TI-RADS risk category: TR2 (2 points). ACR TI-RADS recommendations: This nodule does NOT meet TI-RADS criteria for biopsy or dedicated follow-up. _________________________________________________________ IMPRESSION: Right nodule 1 does not meet criteria for biopsy nor follow-up There is no tissue in the left thyroid bed. Please correlate with history of thyroid surgery. The above is in keeping with the ACR TI-RADS recommendations - J Am Coll Radiol 2017;14:587-595. Electronically Signed   By: Jolaine Click M.D.   On: 10/22/2017 16:56  Assessment/Plan  1. Primary osteoarthritis of right knee - S/P total knee arthroplasty, follow-up with orthopedics, for outpatient PT and OT, will continue Lovenox 30 mg SQ BID till 11/13/17 for DVT prophylaxis,  Tramadol 50 mg 1-2 tabs Q 4 hours PRN and Oxycodone 5 mg 1 tab Q 4 hours PRN for pain   2. Benign essential hypertension - well-controlled, continue Torsemide 20 mg 1 tab daily   3. Uncomplicated asthma, unspecified asthma severity, unspecified whether persistent - no wheezing, continue Ventolin HFA 2 puffs Q 6 hours PRN, Spiriva 18 mcg inhale 1 capsule content into inhaler daily, and Loratadine 10 mg 1 tab daily   4. Hypokalemia - continue KCL 10 meq ER 1 tab daily Lab Results  Component Value Date   K 3.6 10/15/2017     5. Neuropathy - stable, continue Gabapentin 300 mg 1 capsule  BID   6. Gastroesophageal reflux disease, esophagitis presence not specified - continue Omeprazole 20 mg 1 capsule daily     I have filled out patient's discharge paperwork and written prescriptions.  Patient will receive outpatient PT and OT.  DME provided:  None  Total discharge time: Less than 30 minutes  Discharge time involved coordination of the discharge process with social worker, nursing staff and therapy department. Medical justification for home health services verified.    Kenard Gower, NP [facility] [phone] (Monday-Friday 8:00 a.m. - 5:00 p.m.) [phone] (after hours)

## 2017-12-16 ENCOUNTER — Encounter: Payer: Self-pay | Admitting: Adult Health

## 2017-12-16 NOTE — Progress Notes (Signed)
This encounter was created in error - please disregard.

## 2018-02-25 ENCOUNTER — Other Ambulatory Visit: Payer: Self-pay | Admitting: Surgery

## 2018-02-25 DIAGNOSIS — R1310 Dysphagia, unspecified: Secondary | ICD-10-CM

## 2018-02-25 DIAGNOSIS — E041 Nontoxic single thyroid nodule: Secondary | ICD-10-CM

## 2018-03-04 ENCOUNTER — Ambulatory Visit
Admission: RE | Admit: 2018-03-04 | Discharge: 2018-03-04 | Disposition: A | Discharge status: Home / Self Care | Payer: BC Managed Care – PPO | Source: Ambulatory Visit | Attending: Surgery | Admitting: Surgery

## 2018-03-04 DIAGNOSIS — E041 Nontoxic single thyroid nodule: Secondary | ICD-10-CM | POA: Insufficient documentation

## 2018-03-04 DIAGNOSIS — R1310 Dysphagia, unspecified: Secondary | ICD-10-CM | POA: Insufficient documentation

## 2018-03-04 DIAGNOSIS — K219 Gastro-esophageal reflux disease without esophagitis: Secondary | ICD-10-CM | POA: Diagnosis not present

## 2018-04-26 ENCOUNTER — Encounter: Payer: Self-pay | Admitting: Student

## 2018-04-30 ENCOUNTER — Encounter: Payer: Self-pay | Admitting: Anesthesiology

## 2018-04-30 ENCOUNTER — Ambulatory Visit
Admission: RE | Admit: 2018-04-30 | Discharge: 2018-04-30 | Disposition: A | Discharge status: Home / Self Care | Payer: BC Managed Care – PPO | Attending: Unknown Physician Specialty | Admitting: Unknown Physician Specialty

## 2018-04-30 ENCOUNTER — Ambulatory Visit: Payer: BC Managed Care – PPO | Admitting: Anesthesiology

## 2018-04-30 ENCOUNTER — Encounter
Admission: RE | Disposition: A | Discharge status: Home / Self Care | Payer: Self-pay | Source: Home / Self Care | Attending: Unknown Physician Specialty

## 2018-04-30 DIAGNOSIS — Z91018 Allergy to other foods: Secondary | ICD-10-CM | POA: Diagnosis not present

## 2018-04-30 DIAGNOSIS — I10 Essential (primary) hypertension: Secondary | ICD-10-CM | POA: Diagnosis not present

## 2018-04-30 DIAGNOSIS — Z79899 Other long term (current) drug therapy: Secondary | ICD-10-CM | POA: Diagnosis not present

## 2018-04-30 DIAGNOSIS — Z791 Long term (current) use of non-steroidal anti-inflammatories (NSAID): Secondary | ICD-10-CM | POA: Diagnosis not present

## 2018-04-30 DIAGNOSIS — Z6841 Body Mass Index (BMI) 40.0 and over, adult: Secondary | ICD-10-CM | POA: Insufficient documentation

## 2018-04-30 DIAGNOSIS — Z888 Allergy status to other drugs, medicaments and biological substances status: Secondary | ICD-10-CM | POA: Insufficient documentation

## 2018-04-30 DIAGNOSIS — F419 Anxiety disorder, unspecified: Secondary | ICD-10-CM | POA: Diagnosis not present

## 2018-04-30 DIAGNOSIS — Z96653 Presence of artificial knee joint, bilateral: Secondary | ICD-10-CM | POA: Diagnosis not present

## 2018-04-30 DIAGNOSIS — Z880 Allergy status to penicillin: Secondary | ICD-10-CM | POA: Diagnosis not present

## 2018-04-30 DIAGNOSIS — M5136 Other intervertebral disc degeneration, lumbar region: Secondary | ICD-10-CM | POA: Diagnosis not present

## 2018-04-30 DIAGNOSIS — K219 Gastro-esophageal reflux disease without esophagitis: Secondary | ICD-10-CM | POA: Insufficient documentation

## 2018-04-30 DIAGNOSIS — Z96641 Presence of right artificial hip joint: Secondary | ICD-10-CM | POA: Insufficient documentation

## 2018-04-30 DIAGNOSIS — E119 Type 2 diabetes mellitus without complications: Secondary | ICD-10-CM | POA: Diagnosis not present

## 2018-04-30 DIAGNOSIS — R1310 Dysphagia, unspecified: Secondary | ICD-10-CM | POA: Diagnosis present

## 2018-04-30 DIAGNOSIS — J45909 Unspecified asthma, uncomplicated: Secondary | ICD-10-CM | POA: Insufficient documentation

## 2018-04-30 DIAGNOSIS — Z87891 Personal history of nicotine dependence: Secondary | ICD-10-CM | POA: Diagnosis not present

## 2018-04-30 DIAGNOSIS — Z7951 Long term (current) use of inhaled steroids: Secondary | ICD-10-CM | POA: Insufficient documentation

## 2018-04-30 DIAGNOSIS — Z885 Allergy status to narcotic agent status: Secondary | ICD-10-CM | POA: Diagnosis not present

## 2018-04-30 DIAGNOSIS — K317 Polyp of stomach and duodenum: Secondary | ICD-10-CM | POA: Diagnosis not present

## 2018-04-30 DIAGNOSIS — Z91040 Latex allergy status: Secondary | ICD-10-CM | POA: Diagnosis not present

## 2018-04-30 DIAGNOSIS — G4730 Sleep apnea, unspecified: Secondary | ICD-10-CM | POA: Insufficient documentation

## 2018-04-30 DIAGNOSIS — K449 Diaphragmatic hernia without obstruction or gangrene: Secondary | ICD-10-CM | POA: Insufficient documentation

## 2018-04-30 HISTORY — PX: ESOPHAGOGASTRODUODENOSCOPY (EGD) WITH PROPOFOL: SHX5813

## 2018-04-30 HISTORY — DX: Morbid (severe) obesity due to excess calories: E66.01

## 2018-04-30 HISTORY — DX: Other dysphagia: R13.19

## 2018-04-30 HISTORY — DX: Dyskinesia of esophagus: K22.4

## 2018-04-30 HISTORY — DX: Other allergy status, other than to drugs and biological substances: Z91.09

## 2018-04-30 HISTORY — DX: Cor pulmonale (chronic): I27.81

## 2018-04-30 HISTORY — DX: Other intervertebral disc degeneration, lumbar region: M51.36

## 2018-04-30 HISTORY — DX: Unilateral primary osteoarthritis, right hip: M16.11

## 2018-04-30 HISTORY — DX: Other spondylosis with radiculopathy, lumbar region: M47.26

## 2018-04-30 HISTORY — DX: Nontoxic single thyroid nodule: E04.1

## 2018-04-30 HISTORY — DX: Other intervertebral disc degeneration, lumbar region without mention of lumbar back pain or lower extremity pain: M51.369

## 2018-04-30 HISTORY — DX: Allergy status to unspecified drugs, medicaments and biological substances: Z88.9

## 2018-04-30 HISTORY — DX: Unilateral primary osteoarthritis, left knee: M17.12

## 2018-04-30 SURGERY — ESOPHAGOGASTRODUODENOSCOPY (EGD) WITH PROPOFOL
Anesthesia: General

## 2018-04-30 MED ORDER — PROPOFOL 10 MG/ML IV BOLUS
INTRAVENOUS | Status: DC | PRN
  Administered 2018-04-30: 50 mg via INTRAVENOUS

## 2018-04-30 MED ORDER — LIDOCAINE HCL (CARDIAC) PF 100 MG/5ML IV SOSY
PREFILLED_SYRINGE | INTRAVENOUS | Status: DC | PRN
  Administered 2018-04-30: 50 mg via INTRAVENOUS

## 2018-04-30 MED ORDER — SODIUM CHLORIDE 0.9 % IV SOLN
INTRAVENOUS | Status: DC
  Administered 2018-04-30: 1000 mL via INTRAVENOUS

## 2018-04-30 MED ORDER — PROPOFOL 500 MG/50ML IV EMUL
INTRAVENOUS | Status: DC | PRN
  Administered 2018-04-30: 175 ug/kg/min via INTRAVENOUS

## 2018-04-30 MED ORDER — SODIUM CHLORIDE 0.9 % IV SOLN: INTRAVENOUS | Status: DC

## 2018-04-30 MED ORDER — PIPERACILLIN-TAZOBACTAM 3.375 G IVPB
INTRAVENOUS | Status: AC
  Administered 2018-04-30: 3.375 g
  Filled 2018-04-30: qty 50

## 2018-04-30 MED ORDER — PROPOFOL 500 MG/50ML IV EMUL
INTRAVENOUS | Status: AC
  Filled 2018-04-30: qty 50

## 2018-04-30 NOTE — Anesthesia Procedure Notes (Signed)
Date/Time: 04/30/2018 8:20 AM Performed by: Ginger CarneMichelet, Kinsey Cowsert, CRNA Pre-anesthesia Checklist: Patient identified, Emergency Drugs available, Suction available, Patient being monitored and Timeout performed Patient Re-evaluated:Patient Re-evaluated prior to induction Oxygen Delivery Method: Nasal cannula Preoxygenation: Pre-oxygenation with 100% oxygen Induction Type: IV induction

## 2018-04-30 NOTE — Anesthesia Post-op Follow-up Note (Signed)
Anesthesia QCDR form completed.        

## 2018-04-30 NOTE — Anesthesia Preprocedure Evaluation (Signed)
Anesthesia Evaluation  Patient identified by MRN, date of birth, ID band Patient awake    Reviewed: Allergy & Precautions, NPO status , Patient's Chart, lab work & pertinent test results, reviewed documented beta blocker date and time   History of Anesthesia Complications (+) PONV and history of anesthetic complications  Airway Mallampati: III  TM Distance: >3 FB     Dental  (+) Chipped   Pulmonary shortness of breath, asthma , sleep apnea , former smoker,           Cardiovascular hypertension, Pt. on medications + Valvular Problems/Murmurs      Neuro/Psych  Headaches, PSYCHIATRIC DISORDERS Anxiety Depression  Neuromuscular disease    GI/Hepatic hiatal hernia, GERD  ,  Endo/Other  diabetes, Type obesity  Renal/GU      Musculoskeletal  (+) Arthritis ,   Abdominal   Peds  Hematology   Anesthesia Other Findings   Reproductive/Obstetrics                             Anesthesia Physical Anesthesia Plan  ASA: III  Anesthesia Plan: General   Post-op Pain Management:    Induction: Intravenous  PONV Risk Score and Plan:   Airway Management Planned:   Additional Equipment:   Intra-op Plan:   Post-operative Plan:   Informed Consent: I have reviewed the patients History and Physical, chart, labs and discussed the procedure including the risks, benefits and alternatives for the proposed anesthesia with the patient or authorized representative who has indicated his/her understanding and acceptance.     Plan Discussed with: CRNA  Anesthesia Plan Comments:         Anesthesia Quick Evaluation

## 2018-04-30 NOTE — Transfer of Care (Signed)
Immediate Anesthesia Transfer of Care Note  Patient: Deborah Miller  Procedure(s) Performed: ESOPHAGOGASTRODUODENOSCOPY (EGD) WITH PROPOFOL (N/A )  Patient Location: PACU  Anesthesia Type:General  Level of Consciousness: awake, alert  and oriented  Airway & Oxygen Therapy: Patient Spontanous Breathing and Patient connected to nasal cannula oxygen  Post-op Assessment: Report given to RN and Post -op Vital signs reviewed and stable  Post vital signs: Reviewed and stable  Last Vitals:  Vitals Value Taken Time  BP 116/63 04/30/2018  8:35 AM  Temp 36.3 C 04/30/2018  8:35 AM  Pulse 79 04/30/2018  8:35 AM  Resp 16 04/30/2018  8:35 AM  SpO2 96 % 04/30/2018  8:35 AM    Last Pain:  Vitals:   04/30/18 0835  TempSrc: Tympanic  PainSc: 0-No pain         Complications: No apparent anesthesia complications

## 2018-04-30 NOTE — Op Note (Signed)
[facility]astroenter[facility] Patient Name: Deborah LeasMaryann Miller Procedure Date: 04/30/2018 8:11 AM MRN: [id number] Account #: [id number] Date of Birth: Sep 03, 1955 Admit Type: Outpatient Age: [age] [facility] ROOM 3 Gender: Female Note Status: Finalized Procedure:            Upper GI endoscopy Indications:          Dysphagia Providers:            Scot Junobert T. Elliott, MD Referring MD:         Danella PentonMark F. Miller, MD (Referring MD) Medicines:            Propofol per Anesthesia Complications:        No immediate complications. Procedure:            Pre-Anesthesia Assessment:                       - After reviewing the risks and benefits, the patient                        was deemed in satisfactory condition to undergo the                        procedure.                       After obtaining informed consent, the endoscope was                        passed under direct vision. Throughout the procedure,                        the patient's blood pressure, pulse, and oxygen                        saturations were monitored continuously. The Endoscope                        was introduced through the mouth, and advanced to the                        second part of duodenum. The upper GI endoscopy was                        accomplished without difficulty. The patient tolerated                        the procedure well. Findings:      A small hiatal hernia was present. GEJ was 36cm. After exam I passed a       guide wire down the scope and removed the scope and passed a 42F and a       49F      Savary dilator with minimal resistance.      Patchy mildly erythematous mucosa without bleeding was found in the       gastric antrum. Scattered harmless gastric polyps seen in stomach.      The examined duodenum was normal. Impression:           - Small hiatal hernia.                       - Erythematous mucosa in the antrum.                       -  Normal examined duodenum.              - No specimens collected. Recommendation:       - The findings and recommendations were discussed with                        the patient's family. Scot Junobert T Elliott, MD 04/30/2018 8:41:36 AM This report has been signed electronically. Number of Addenda: 0 Note Initiated On: 04/30/2018 8:11 AM      [facility]

## 2018-04-30 NOTE — Anesthesia Postprocedure Evaluation (Signed)
Anesthesia Post Note  Patient: Hassell HalimMaryann C Dyck  Procedure(s) Performed: ESOPHAGOGASTRODUODENOSCOPY (EGD) WITH PROPOFOL (N/A )  Patient location during evaluation: Endoscopy Anesthesia Type: General Level of consciousness: awake and alert Pain management: pain level controlled Vital Signs Assessment: post-procedure vital signs reviewed and stable Respiratory status: spontaneous breathing, nonlabored ventilation, respiratory function stable and patient connected to nasal cannula oxygen Cardiovascular status: blood pressure returned to baseline and stable Postop Assessment: no apparent nausea or vomiting Anesthetic complications: no     Last Vitals:  Vitals:   04/30/18 0835 04/30/18 0845  BP: 116/63 113/88  Pulse: 79 73  Resp: 16   Temp: (!) 36.3 C   SpO2: 96% 97%    Last Pain:  Vitals:   04/30/18 0905  TempSrc:   PainSc: 0-No pain                 Laquon Emel S

## 2018-04-30 NOTE — H&P (Signed)
Primary Care Physician:  Danella Penton, MD Primary Gastroenterologist:  Dr. Mechele Collin  Pre-Procedure History & Physical: HPI:  Deborah Miller is a 62 y.o. female is here for an endoscopy.  Done for dysphagia.  Last one 2009.   Past Medical History:  Diagnosis Date  . Acid reflux   . Allergic genetic state   . Anxiety   . Arthritis   . Asthma 1957  . Breast microcalcification, mammographic 2014  . Breast screening, unspecified   . Carpal tunnel syndrome 1999   repaired left wrist  . Cor pulmonale (HCC)   . DDD (degenerative disc disease), lumbar   . Depression   . Diabetes mellitus without complication (HCC) 2019   borderline, follows diabetic diet often  . Environmental allergies   . Esophageal spasm   . Fecal smearing   . Headache    sinus migraines. takes OTC meds for allergies  . Heart murmur    as a child, not treated  . Hemorrhoids 2012  . History of hiatal hernia   . Hypertension   . Lump in thyroid 2019   having an ultrasound 10/2017 to rule out tumor or salivary gland enlargement  . Morbid obesity (HCC)   . Osteoarthritis of spine with radiculopathy, lumbar region   . Other dysphagia   . Pain    chronic lbp,stenosis  . PONV (postoperative nausea and vomiting)    nausea and vomitting due to morphine  . Primary osteoarthritis of left knee   . Primary osteoarthritis of right hip   . Right thyroid nodule   . Shortness of breath dyspnea    increased with anxiety  . Sleep apnea    cpap    Past Surgical History:  Procedure Laterality Date  . ANTERIOR FUSION CERVICAL SPINE    . BACK SURGERY  2010   C 5 & 6 fusion  . BREAST EXCISIONAL BIOPSY Left 2014   Stereo, benign   . BUNIONECTOMY Left 1996   pin removed  . carpal tunnel--left hand  Left 2012  . CHOLECYSTECTOMY  2002  . COLONOSCOPY  2009   Dr. Mechele Collin  . COLONOSCOPY WITH PROPOFOL N/A 08/20/2016   Procedure: COLONOSCOPY WITH PROPOFOL;  Surgeon: Earline Mayotte, MD;  Location: [facility] ENDOSCOPY;   Service: Endoscopy;  Laterality: N/A;  . DILATION AND CURETTAGE OF UTERUS  1987  . ganglion cyst removal  Left 1988  . JOINT REPLACEMENT Right 2016   TOTAL HIP ARTHROPLASTY by dr. Ernest Pine  . KNEE ARTHROPLASTY Right 10/28/2017   Procedure: COMPUTER ASSISTED TOTAL KNEE ARTHROPLASTY;  Surgeon: Donato Heinz, MD;  Location: [facility] ORS;  Service: Orthopedics;  Laterality: Right;  . KNEE SURGERY Left 2014   partial knee replacement by dr. Erin Sons  . left breast biopsy Left 2006   mammary glands swollen  . MOUTH LESION EXCISIONAL BIOPSY    . NASAL SEPTUM SURGERY  1975   deviated septum repaired  . neck injection Right 2014   block for inflammed nerve. repeated in 2018 on left side for same thing. done by dr. Lowella Dandy  . NOSE SURGERY  2001   clean out of nasal passages  . TONSILLECTOMY  1991   uvula removed also for OSA  . tonsilloadenoidectomy  2003   uvula also for OSA. titanium screw on bottom of lower jaw  . TOTAL HIP ARTHROPLASTY Right 02/14/2015   Procedure: TOTAL HIP ARTHROPLASTY;  Surgeon: Donato Heinz, MD;  Location: [facility] ORS;  Service: Orthopedics;  Laterality: Right;  .  TUBAL LIGATION    . UPPER GI ENDOSCOPY  2009, 2015   Dr Mechele Collin / Dr Lemar Livings    Prior to Admission medications   Medication Sig Start Date End Date Taking? Authorizing Provider  ADVAIR HFA 115-21 MCG/ACT inhaler Inhale 2 puffs into the lungs 2 (two) times daily.  10/10/12  Yes [provider]  ALPRAZolam (XANAX) 0.25 MG tablet Take 1 tablet (0.25 mg total) by mouth daily. 11/02/17  Yes Lorenso Quarry, NP  b complex vitamins tablet Take 1 tablet by mouth daily.   Yes [provider]  calcium carbonate (TUMS - DOSED IN MG ELEMENTAL CALCIUM) 500 MG chewable tablet Chew 1-3 tablets by mouth 2 (two) times daily as needed for indigestion or heartburn.    Yes [provider]  Calcium Carbonate-Vitamin D (CALCIUM 600 + D PO) Take 1 tablet by mouth daily.   Yes [provider]   cetirizine (ZYRTEC) 10 MG tablet Take 10 mg by mouth daily.   Yes [provider]  cholestyramine light (PREVALITE) 4 GM/DOSE powder Take 4 g by mouth daily as needed (GI distress).    Yes [provider]  docusate sodium (COLACE) 100 MG capsule Take 100 mg by mouth daily.    Yes [provider]  gabapentin (NEURONTIN) 300 MG capsule Take 300 mg by mouth 2 (two) times daily.  10/10/12  Yes [provider]  L-Lysine 500 MG CAPS Take 500 mg by mouth daily.    Yes [provider]  loratadine (CLARITIN) 10 MG tablet Take 10 mg by mouth daily.    Yes [provider]  Melatonin 3 MG TABS Take 3 mg by mouth at bedtime.    Yes [provider]  meloxicam (MOBIC) 15 MG tablet Take 15 mg by mouth daily.   Yes [provider]  Multiple Vitamin (MULTIVITAMIN) tablet Take 1 tablet by mouth daily.   Yes [provider]  omeprazole (PRILOSEC) 20 MG capsule Take 20 mg by mouth daily.    Yes [provider]  polyvinyl alcohol (LIQUIFILM TEARS) 1.4 % ophthalmic solution Place 1 drop into both eyes 3 (three) times daily as needed for dry eyes.    Yes [provider]  potassium chloride (K-DUR) 10 MEQ tablet Take 10 mEq by mouth daily.    Yes [provider]  pramipexole (MIRAPEX) 0.125 MG tablet Take 0.125 mg by mouth at bedtime.    Yes [provider]  sodium chloride (OCEAN) 0.65 % SOLN nasal spray Place 1 spray into both nostrils every 2 (two) hours as needed for congestion.    Yes [provider]  SPIRIVA HANDIHALER 18 MCG inhalation capsule Place 1 capsule into inhaler and inhale daily. 10/22/12  Yes [provider]  torsemide (DEMADEX) 20 MG tablet Take 20 mg by mouth daily.  10/22/12  Yes [provider]  traMADol (ULTRAM) 50 MG tablet Take 1-2 tablets (50-100 mg total) by mouth every 4 (four) hours as needed for moderate pain. 11/09/17  Yes Sharee Holster, NP   valACYclovir (VALTREX) 1000 MG tablet Take 2,000 mg by mouth 2 (two) times daily as needed (fever blisters). Take for only 1 day for fever blister symptoms 05/23/16  Yes [provider]  acetaminophen (TYLENOL) 500 MG tablet Take 650 mg by mouth every 6 (six) hours as needed for moderate pain or headache. If no relief of pain within one hour, patient takes a second one.    [provider]  albuterol (PROVENTIL  HFA;VENTOLIN HFA) 108 (90 BASE) MCG/ACT inhaler Inhale 2 puffs into the lungs every 6 (six) hours as needed for wheezing or shortness of breath.     [provider]  Chlorpheniramine-DM (CORICIDIN COUGH/COLD) 4-30 MG TABS Take 1 tablet by mouth 2 (two) times daily as needed (cough / cold symptoms).    [provider]  enoxaparin (LOVENOX) 30 MG/0.3ML injection Inject 0.3 mLs (30 mg total) into the skin every 12 (twelve) hours for 14 days. 10/31/17 11/14/17  Tera PartridgeWolfe, Jon R, PA  EPINEPHrine (AUVI-Q) 0.3 mg/0.3 mL IJ SOAJ injection Inject 0.3 mg into the muscle daily as needed.     [provider]  oxyCODONE (OXY IR/ROXICODONE) 5 MG immediate release tablet Take 1 tablet (5 mg total) by mouth every 4 (four) hours as needed for moderate pain (pain score 4-6). Patient not taking: Reported on 04/30/2018 10/29/17   Tera PartridgeWolfe, Jon R, PA    Allergies as of 04/08/2018 - Review Complete 11/11/2017  Allergen Reaction Noted  . Citrus  10/09/2017  . Pork-derived products  10/09/2017  . Augmentin [amoxicillin-pot clavulanate] Nausea And Vomiting 03/20/2014  . Latex Other (See Comments) 09/01/2012  . Morphine and related Nausea And Vomiting 10/15/2017  . Cefadroxil Other (See Comments) 02/21/2016  . Other Other (See Comments) 10/15/2017  . Pollen extract Other (See Comments) 10/15/2017  . Tape Other (See Comments) 09/01/2012    Family History  Problem Relation Age of Onset  . Ovarian cancer Maternal Grandmother   . Diabetes Father   . Heart disease Father   .  Breast cancer Neg Hx   . Colon cancer Neg Hx     Social History   Socioeconomic History  . Marital status: Married    Spouse name: Not on file  . Number of children: Not on file  . Years of education: Not on file  . Highest education level: Not on file  Occupational History  . Not on file  Social Needs  . Financial resource strain: Not on file  . Food insecurity:    Worry: Not on file    Inability: Not on file  . Transportation needs:    Medical: Not on file    Non-medical: Not on file  Tobacco Use  . Smoking status: Former Smoker    Packs/day: 1.00    Years: 10.00    Pack years: 10.00    Last attempt to quit: 01/24/1987    Years since quitting: 31.2  . Smokeless tobacco: Never Used  Substance and Sexual Activity  . Alcohol use: Yes    Comment: occassional   . Drug use: No  . Sexual activity: Not Currently    Birth control/protection: Post-menopausal  Lifestyle  . Physical activity:    Days per week: 3 days    Minutes per session: 30 min  . Stress: Not on file  Relationships  . Social connections:    Talks on phone: Not on file    Gets together: Not on file    Attends religious service: Not on file    Active member of club or organization: Not on file    Attends meetings of clubs or organizations: Not on file    Relationship status: Not on file  . Intimate partner violence:    Fear of current or ex partner: Not on file    Emotionally abused: Not on file    Physically abused: Not on file    Forced sexual activity: Not on file  Other Topics Concern  .  Not on file  Social History Narrative  . Not on file    Review of Systems: See HPI, otherwise negative ROS  Physical Exam: BP 140/84   Pulse 80   Temp 98.6 F (37 C) (Tympanic)   Resp 18   Ht 4\' 7"  (1.397 m)   Wt 89.4 kg   SpO2 99%   BMI 45.79 kg/m  General:   Alert,  pleasant and cooperative in NAD Head:  Normocephalic and atraumatic. Neck:  Supple; no masses or thyromegaly. Lungs:  Clear  throughout to auscultation.    Heart:  Regular rate and rhythm. Abdomen:  Soft, nontender and nondistended. Normal bowel sounds, without guarding, and without rebound.   Neurologic:  Alert and  oriented x4;  grossly normal neurologically.  Impression/Plan: Deborah Miller is here for an endoscopy to be performed for Dysphagia of esophagus.  Risks, benefits, limitations, and alternatives regarding  endoscopy have been reviewed with the patient.  Questions have been answered.  All parties agreeable.   Lynnae PrudeELLIOTT, Sayer Masini, MD  04/30/2018, 8:14 AM

## 2018-05-03 ENCOUNTER — Encounter: Payer: Self-pay | Admitting: Unknown Physician Specialty

## 2018-07-14 ENCOUNTER — Other Ambulatory Visit: Payer: Self-pay | Admitting: Internal Medicine

## 2018-07-14 DIAGNOSIS — Z1231 Encounter for screening mammogram for malignant neoplasm of breast: Secondary | ICD-10-CM

## 2018-09-07 ENCOUNTER — Encounter: Payer: BC Managed Care – PPO | Admitting: Obstetrics and Gynecology

## 2018-09-21 ENCOUNTER — Ambulatory Visit: Payer: BC Managed Care – PPO

## 2018-09-22 ENCOUNTER — Ambulatory Visit
Admission: RE | Admit: 2018-09-22 | Discharge: 2018-09-22 | Disposition: A | Discharge status: Home / Self Care | Payer: BC Managed Care – PPO | Source: Ambulatory Visit | Attending: Internal Medicine | Admitting: Internal Medicine

## 2018-09-22 ENCOUNTER — Other Ambulatory Visit: Payer: Self-pay

## 2018-09-22 DIAGNOSIS — Z1231 Encounter for screening mammogram for malignant neoplasm of breast: Secondary | ICD-10-CM | POA: Insufficient documentation

## 2018-10-28 ENCOUNTER — Other Ambulatory Visit: Payer: Self-pay | Admitting: Orthopedic Surgery

## 2018-10-28 ENCOUNTER — Telehealth: Payer: Self-pay | Admitting: Obstetrics and Gynecology

## 2018-10-28 ENCOUNTER — Other Ambulatory Visit (HOSPITAL_COMMUNITY): Payer: Self-pay | Admitting: Orthopedic Surgery

## 2018-10-28 DIAGNOSIS — M542 Cervicalgia: Secondary | ICD-10-CM

## 2018-10-28 NOTE — Telephone Encounter (Signed)
The patient called and stated that she need to speak with her nurse in regards to her having a mammogram, and also her not having a pap in 2 years, But the patient stated that she has to come more often because of her family health history. Pt is requesting call back. Please advise.

## 2018-10-28 NOTE — Telephone Encounter (Signed)
Pt called to speak more about her concerned to the office. Pt wanted to know if [facility] was going to do a vaginal exam during her annual exam due to having family history of cervical cancer and know if [facility] received her mammogram results. Pt was informed that we received her mammogram results and was advised to let [facility] know the day of her visit about her family hx.

## 2018-11-11 ENCOUNTER — Other Ambulatory Visit: Payer: Self-pay

## 2018-11-11 ENCOUNTER — Ambulatory Visit
Admission: RE | Admit: 2018-11-11 | Discharge: 2018-11-11 | Disposition: A | Discharge status: Home / Self Care | Payer: BC Managed Care – PPO | Source: Ambulatory Visit | Attending: Orthopedic Surgery | Admitting: Orthopedic Surgery

## 2018-11-11 DIAGNOSIS — M542 Cervicalgia: Secondary | ICD-10-CM | POA: Diagnosis not present

## 2018-11-12 ENCOUNTER — Ambulatory Visit (INDEPENDENT_AMBULATORY_CARE_PROVIDER_SITE_OTHER): Payer: BC Managed Care – PPO | Admitting: Obstetrics and Gynecology

## 2018-11-12 ENCOUNTER — Other Ambulatory Visit: Payer: Self-pay

## 2018-11-12 ENCOUNTER — Other Ambulatory Visit (HOSPITAL_COMMUNITY)
Admission: RE | Admit: 2018-11-12 | Discharge: 2018-11-12 | Disposition: A | Discharge status: Home / Self Care | Payer: BC Managed Care – PPO | Source: Ambulatory Visit | Attending: Obstetrics and Gynecology | Admitting: Obstetrics and Gynecology

## 2018-11-12 ENCOUNTER — Encounter: Payer: Self-pay | Admitting: Obstetrics and Gynecology

## 2018-11-12 VITALS — BP 157/75 | HR 97 | Ht <= 58 in | Wt 210.7 lb

## 2018-11-12 DIAGNOSIS — Z124 Encounter for screening for malignant neoplasm of cervix: Secondary | ICD-10-CM | POA: Insufficient documentation

## 2018-11-12 DIAGNOSIS — Z01419 Encounter for gynecological examination (general) (routine) without abnormal findings: Secondary | ICD-10-CM | POA: Diagnosis not present

## 2018-11-12 DIAGNOSIS — M6208 Separation of muscle (nontraumatic), other site: Secondary | ICD-10-CM

## 2018-11-12 DIAGNOSIS — E119 Type 2 diabetes mellitus without complications: Secondary | ICD-10-CM | POA: Diagnosis not present

## 2018-11-12 DIAGNOSIS — Z780 Asymptomatic menopausal state: Secondary | ICD-10-CM

## 2018-11-12 NOTE — Progress Notes (Signed)
Patient comes in today for her yearly physical. She has no concerns today. Patient is requesting PAP due to family history. Mammogram and labs done through PCP.

## 2018-11-12 NOTE — Patient Instructions (Signed)

## 2018-11-12 NOTE — Progress Notes (Signed)
ANNUAL PREVENTATIVE CARE GYNECOLOGY  ENCOUNTER NOTE  Subjective:       Deborah Miller is a 63 y.o. G0P0 female widowed (husband passed in November 2019) here for a routine annual gynecologic exam. The patient is not sexually active. The patient is not taking hormone replacement therapy. Patient denies post-menopausal vaginal bleeding. The patient wears seatbelts: yes. The patient participates in regular exercise: yes, occasionally. Has the patient ever been transfused or tattooed?: no. The patient reports that there is not domestic violence in her life.  Current complaints: 1.  None   Gynecologic History No LMP recorded. Patient is postmenopausal. Contraception: post menopausal status Last Pap: 06/2016. Results were: normal Last mammogram: 09/22/2018. Results were: normal Last Colonoscopy: 08/2016, Normal. Needs repeat in 10 years. Also had upper endoscopy in 04/2018.     Obstetric History OB History  Gravida Para Term Preterm AB Living  0            SAB TAB Ectopic Multiple Live Births             Obstetric Comments  1st Menstrual Cycle:  Age 63    Past Medical History:  Diagnosis Date   Acid reflux    Allergic genetic state    Anxiety    Arthritis    Asthma 1957   Breast microcalcification, mammographic 2014   Breast screening, unspecified    Carpal tunnel syndrome 1999   repaired left wrist   Cor pulmonale (HCC)    DDD (degenerative disc disease), lumbar    Depression    Diabetes mellitus without complication (HCC) 2019   borderline, follows diabetic diet often   Environmental allergies    Esophageal spasm    Fecal smearing    Headache    sinus migraines. takes OTC meds for allergies   Heart murmur    as a child, not treated   Hemorrhoids 2012   History of hiatal hernia    Hypertension    Lump in thyroid 2019   having an ultrasound 10/2017 to rule out tumor or salivary gland enlargement   Morbid obesity (HCC)    Osteoarthritis of  spine with radiculopathy, lumbar region    Other dysphagia    Pain    chronic lbp,stenosis   PONV (postoperative nausea and vomiting)    nausea and vomitting due to morphine   Primary osteoarthritis of left knee    Primary osteoarthritis of right hip    Right thyroid nodule    Shortness of breath dyspnea    increased with anxiety   Sleep apnea    cpap    Family History  Problem Relation Age of Onset   Ovarian cancer Maternal Grandmother    Diabetes Father    Heart disease Father    Breast cancer Neg Hx    Colon cancer Neg Hx     Past Surgical History:  Procedure Laterality Date   ANTERIOR FUSION CERVICAL SPINE     BACK SURGERY  2010   C 5 & 6 fusion   BREAST BIOPSY Left    stereo   BREAST EXCISIONAL BIOPSY Left 2014   benign    BUNIONECTOMY Left 1996   pin removed   carpal tunnel--left hand  Left 2012   CHOLECYSTECTOMY  2002   COLONOSCOPY  2009   Dr. Mechele CollinElliott   COLONOSCOPY WITH PROPOFOL N/A 08/20/2016   Procedure: COLONOSCOPY WITH PROPOFOL;  Surgeon: Earline MayotteJeffrey W Byrnett, MD;  Location: [facility];  Service: Endoscopy;  Laterality: N/A;  DILATION AND CURETTAGE OF UTERUS  1987   ESOPHAGOGASTRODUODENOSCOPY (EGD) WITH PROPOFOL N/A 04/30/2018   Procedure: ESOPHAGOGASTRODUODENOSCOPY (EGD) WITH PROPOFOL;  Surgeon: Scot JunElliott, Robert T, MD;  [facility];  Service: Endoscopy;  Laterality: N/A;   ganglion cyst removal  Left 1988   JOINT REPLACEMENT Right 2016   TOTAL HIP ARTHROPLASTY by dr. Ernest Pinehooten   KNEE ARTHROPLASTY Right 10/28/2017   Procedure: COMPUTER ASSISTED TOTAL KNEE ARTHROPLASTY;  Surgeon: Donato HeinzHooten, James P, MD;  Location: [facility] OR[facility];  Service: Orthopedics;  Laterality: Right;   KNEE SURGERY Left 2014   partial knee replacement by dr. Erin Sonsharold kernodle   left breast biopsy Left 2006   mammary glands swollen   MOUTH LESION EXCISIONAL BIOPSY     NASAL SEPTUM SURGERY  1975   deviated septum repaired   neck injection Right 2014    block for inflammed nerve. repeated in 2018 on left side for same thing. done by dr. Lowella Dandychasnas   NOSE SURGERY  2001   clean out of nasal passages   TONSILLECTOMY  1991   uvula removed also for OSA   tonsilloadenoidectomy  2003   uvula also for OSA. titanium screw on bottom of lower jaw   TOTAL HIP ARTHROPLASTY Right 02/14/2015   Procedure: TOTAL HIP ARTHROPLASTY;  Surgeon: Donato HeinzJames P Hooten, MD;  Location: [facility];  Service: Orthopedics;  Laterality: Right;   TUBAL LIGATION     UPPER GI ENDOSCOPY  2009, 2015   Dr Mechele CollinElliott / Dr Lemar LivingsByrnett    Social History   Socioeconomic History   Marital status: Married    Spouse name: Not on file   Number of children: Not on file   Years of education: Not on file   Highest education level: Not on file  Occupational History   Not on file  Social Needs   Financial resource strain: Not on file   Food insecurity    Worry: Not on file    Inability: Not on file   Transportation needs    Medical: Not on file    Non-medical: Not on file  Tobacco Use   Smoking status: Former Smoker    Packs/day: 1.00    Years: 10.00    Pack years: 10.00    Quit date: 01/24/1987    Years since quitting: 31.8   Smokeless tobacco: Never Used  Substance and Sexual Activity   Alcohol use: Yes    Comment: occassional    Drug use: No   Sexual activity: Not Currently    Birth control/protection: Post-menopausal  Lifestyle   Physical activity    Days per week: 3 days    Minutes per session: 30 min   Stress: Not on file  Relationships   Social connections    Talks on phone: Not on file    Gets together: Not on file    Attends religious service: Not on file    Active member of club or organization: Not on file    Attends meetings of clubs or organizations: Not on file    Relationship status: Not on file   Intimate partner violence    Fear of current or ex partner: Not on file    Emotionally abused: Not on file    Physically abused: Not on  file    Forced sexual activity: Not on file  Other Topics Concern   Not on file  Social History Narrative   Not on file    Current Outpatient Medications on File Prior to Visit  Medication  Sig Dispense Refill   acetaminophen (TYLENOL) 500 MG tablet Take 650 mg by mouth every 6 (six) hours as needed for moderate pain or headache. If no relief of pain within one hour, patient takes a second one.     ADVAIR HFA 115-21 MCG/ACT inhaler Inhale 2 puffs into the lungs 2 (two) times daily.      albuterol (PROVENTIL HFA;VENTOLIN HFA) 108 (90 BASE) MCG/ACT inhaler Inhale 2 puffs into the lungs every 6 (six) hours as needed for wheezing or shortness of breath.      ALPRAZolam (XANAX) 0.25 MG tablet Take 1 tablet (0.25 mg total) by mouth daily. 30 tablet 0   b complex vitamins tablet Take 1 tablet by mouth daily.     calcium carbonate (TUMS - DOSED IN MG ELEMENTAL CALCIUM) 500 MG chewable tablet Chew 1-3 tablets by mouth 2 (two) times daily as needed for indigestion or heartburn.      Calcium Carbonate-Vitamin D (CALCIUM 600 + D PO) Take 1 tablet by mouth daily.     cetirizine (ZYRTEC) 10 MG tablet Take 10 mg by mouth daily.     Chlorpheniramine-DM (CORICIDIN COUGH/COLD) 4-30 MG TABS Take 1 tablet by mouth 2 (two) times daily as needed (cough / cold symptoms).     cholestyramine light (PREVALITE) 4 GM/DOSE powder Take 4 g by mouth daily as needed (GI distress).      docusate sodium (COLACE) 100 MG capsule Take 100 mg by mouth daily.      EPINEPHrine (AUVI-Q) 0.3 mg/0.3 mL IJ SOAJ injection Inject 0.3 mg into the muscle daily as needed.      gabapentin (NEURONTIN) 300 MG capsule Take 300 mg by mouth 2 (two) times daily.      L-Lysine 500 MG CAPS Take 500 mg by mouth daily.      Melatonin 3 MG TABS Take 3 mg by mouth at bedtime.      meloxicam (MOBIC) 15 MG tablet Take 15 mg by mouth daily.     Multiple Vitamin (MULTIVITAMIN) tablet Take 1 tablet by mouth daily.     omeprazole  (PRILOSEC) 20 MG capsule Take 20 mg by mouth daily.      polyvinyl alcohol (LIQUIFILM TEARS) 1.4 % ophthalmic solution Place 1 drop into both eyes 3 (three) times daily as needed for dry eyes.      potassium chloride (K-DUR) 10 MEQ tablet Take 10 mEq by mouth daily.      pramipexole (MIRAPEX) 0.125 MG tablet Take 0.125 mg by mouth at bedtime.      sodium chloride (OCEAN) 0.65 % SOLN nasal spray Place 1 spray into both nostrils every 2 (two) hours as needed for congestion.      SPIRIVA HANDIHALER 18 MCG inhalation capsule Place 1 capsule into inhaler and inhale daily.     torsemide (DEMADEX) 20 MG tablet Take 20 mg by mouth daily.      traMADol (ULTRAM) 50 MG tablet Take 1-2 tablets (50-100 mg total) by mouth every 4 (four) hours as needed for moderate pain. 120 tablet 0   valACYclovir (VALTREX) 1000 MG tablet Take 2,000 mg by mouth 2 (two) times daily as needed (fever blisters). Take for only 1 day for fever blister symptoms     enoxaparin (LOVENOX) 30 MG/0.3ML injection Inject 0.3 mLs (30 mg total) into the skin every 12 (twelve) hours for 14 days. 28 Syringe 0   loratadine (CLARITIN) 10 MG tablet Take 10 mg by mouth daily.      oxyCODONE (OXY  IR/ROXICODONE) 5 MG immediate release tablet Take 1 tablet (5 mg total) by mouth every 4 (four) hours as needed for moderate pain (pain score 4-6). (Patient not taking: Reported on 11/12/2018) 30 tablet 0   No current facility-administered medications on file prior to visit.     Allergies  Allergen Reactions   Citrus     Itchy throat    Pork-Derived Products     Itchy throat    Augmentin [Amoxicillin-Pot Clavulanate] Nausea And Vomiting    Has patient had a PCN reaction causing immediate rash, facial/tongue/throat swelling, SOB or lightheadedness with hypotension: No Has patient had a PCN reaction causing severe rash involving mucus membranes or skin necrosis: No Has patient had a PCN reaction that required hospitalization: No Has  patient had a PCN reaction occurring within the last 10 years: Yes If all of the above answers are "NO", then may proceed with Cephalosporin use.    Latex Other (See Comments)    Blisters NOTE: Latex IgE NEGATIVE (<0.10) Around mouth causes a rash   Morphine And Related Nausea And Vomiting    Patient does not want morphine at all.   Cefadroxil Other (See Comments)    Severe cramping in side. Felt like ribs were going to explode   Other Other (See Comments)    Lettuce causes her to start with diverticulitis   Pollen Extract Other (See Comments)    Trees, grass, mold, dust causes raspy voice, nasal irritation   Tape Other (See Comments)    Blisters. Paper tape is acceptable      Review of Systems ROS Review of Systems - General ROS: negative for - chills, fatigue, fever, hot flashes, night sweats, weight gain or weight loss Psychological ROS: negative for - anxiety, decreased libido, depression, mood swings, physical abuse or sexual abuse Ophthalmic ROS: negative for - blurry vision, eye pain or loss of vision ENT ROS: negative for - headaches, hearing change, visual changes or vocal changes Allergy and Immunology ROS: negative for - hives, itchy/watery eyes or seasonal allergies Hematological and Lymphatic ROS: negative for - bleeding problems, bruising, swollen lymph nodes or weight loss Endocrine ROS: negative for - galactorrhea, hair pattern changes, hot flashes, malaise/lethargy, mood swings, palpitations, polydipsia/polyuria, skin changes, temperature intolerance or unexpected weight changes Breast ROS: negative for - new or changing breast lumps or nipple discharge Respiratory ROS: negative for - cough or shortness of breath Cardiovascular ROS: negative for - chest pain, irregular heartbeat, palpitations or shortness of breath Gastrointestinal ROS: no abdominal pain, change in bowel habits, or black or bloody stools Genito-Urinary ROS: no dysuria, trouble voiding, or  hematuria Musculoskeletal ROS: negative for - joint stiffness. Positive for joint and neck stiffness (has been seen by Orthopedist).  Neurological ROS: negative for - bowel and bladder control changes Dermatological ROS: negative for rash and skin lesion changes   Objective:   BP (!) 157/75    Pulse 97    Ht 4' 7.5" (1.41 m)    Wt 210 lb 11.2 oz (95.6 kg)    BMI 48.09 kg/m  CONSTITUTIONAL: Well-developed, well-nourished female in no acute distress. Morbid obesity PSYCHIATRIC: Normal mood and affect. Normal behavior. Normal judgment and thought content. NEUROLGIC: Alert and oriented to person, place, and time. Normal muscle tone coordination. No cranial nerve deficit noted. HENT:  Normocephalic, atraumatic, External right and left ear normal. Oropharynx is clear and moist EYES: Conjunctivae and EOM are normal. Pupils are equal, round, and reactive to light. No scleral icterus.  NECK:  Normal range of motion, supple, no masses.  Normal thyroid.  SKIN: Skin is warm and dry. No rash noted. Not diaphoretic. No erythema. No pallor. CARDIOVASCULAR: Normal heart rate noted, regular rhythm, no murmur. RESPIRATORY: Clear to auscultation bilaterally. Effort and breath sounds normal, no problems with respiration noted. BREASTS: Symmetric in size. No masses, skin changes, nipple drainage, or lymphadenopathy. ABDOMEN: Soft, normal bowel sounds, no distention noted.  No tenderness, rebound or guarding. Diastasis recti present. BLADDER: Normal PELVIC:  Bladder no bladder distension noted  Urethra: normal appearing urethra with no masses, tenderness or lesions  Vulva: normal appearing vulva with no masses, tenderness or lesions  Vagina: normal appearing vagina with no discharge, no lesions. Mild vaginal atrophy.   Cervix: normal appearing cervix without discharge or lesions  Uterus: uterus is normal size, shape, consistency and nontender  Adnexa: normal adnexa in size, nontender and no masses  RV:  External Exam NormaI, No Rectal Masses and Normal Sphincter tone  MUSCULOSKELETAL: Normal range of motion. No tenderness.  No cyanosis, clubbing, or edema.  2+ distal pulses. LYMPHATIC: No Axillary, Supraclavicular, or Inguinal Adenopathy.   Labs: Lab Results  Component Value Date   WBC 10.0 10/15/2017   HGB 14.6 10/15/2017   HCT 43.8 10/15/2017   MCV 88.2 10/15/2017   PLT 246 10/15/2017    Lab Results  Component Value Date   CREATININE 0.81 10/15/2017   BUN 23 (H) 10/15/2017   NA 140 10/15/2017   K 3.6 10/15/2017   CL 102 10/15/2017   CO2 26 10/15/2017    Lab Results  Component Value Date   ALT 27 10/15/2017   AST 29 10/15/2017   ALKPHOS 77 10/15/2017   BILITOT 0.8 10/15/2017    No results found for: CHOL, HDL, LDLCALC, LDLDIRECT, TRIG, CHOLHDL  No results found for: TSH  Lab Results  Component Value Date   HGBA1C 6.5 (H) 10/15/2017     Assessment:    Well woman exam with routine gynecological exam Obesity, Class III, BMI 40-49.9 (morbid obesity)  Diastasis recti Controlled type 2 diabetes mellitus without complication, without long-term current use of insulin  Menopause Screening for cervical cancer   Plan:  Pap: Pap Co Test performed at patient's request, despite informing that pap smears screening recommendations were 3-5 years.  Mammogram: Ordered Stool Guaiac Testing:  Not Indicated.  Colonoscopy up to date.  Labs: Labs to be performed by PCP Routine preventative health maintenance measures emphasized: Exercise/Diet/Weight control, Tobacco Warnings, Alcohol/Substance use risks and Stress Management  DM managed by PCP.  Diastasis recti stable.  Return to Clinic - 1 Year   Hildred LaserAnika Quetzally Callas, MD  [facility]

## 2018-11-14 ENCOUNTER — Encounter: Payer: Self-pay | Admitting: Obstetrics and Gynecology

## 2018-11-17 LAB — CYTOLOGY - PAP
Diagnosis: NEGATIVE
HPV: NOT DETECTED

## 2018-12-22 ENCOUNTER — Other Ambulatory Visit: Payer: Self-pay | Admitting: Family Medicine

## 2018-12-22 DIAGNOSIS — M5412 Radiculopathy, cervical region: Secondary | ICD-10-CM

## 2018-12-29 ENCOUNTER — Ambulatory Visit
Admission: RE | Admit: 2018-12-29 | Discharge: 2018-12-29 | Disposition: A | Discharge status: Home / Self Care | Payer: BC Managed Care – PPO | Source: Ambulatory Visit | Attending: Family Medicine | Admitting: Family Medicine

## 2018-12-29 ENCOUNTER — Other Ambulatory Visit: Payer: Self-pay

## 2018-12-29 DIAGNOSIS — M5412 Radiculopathy, cervical region: Secondary | ICD-10-CM

## 2018-12-29 MED ORDER — TRIAMCINOLONE ACETONIDE 40 MG/ML IJ SUSP (RADIOLOGY)
60.0000 mg | Freq: Once | INTRAMUSCULAR | Status: AC
  Administered 2018-12-29: 60 mg via EPIDURAL

## 2018-12-29 MED ORDER — IOPAMIDOL (ISOVUE-M 300) INJECTION 61%
1.0000 mL | Freq: Once | INTRAMUSCULAR | Status: AC
  Administered 2018-12-29: 1 mL via EPIDURAL

## 2018-12-29 NOTE — Discharge Instructions (Signed)

## 2019-02-10 ENCOUNTER — Other Ambulatory Visit: Payer: Self-pay | Admitting: Family Medicine

## 2019-02-10 DIAGNOSIS — M5412 Radiculopathy, cervical region: Secondary | ICD-10-CM

## 2019-02-18 ENCOUNTER — Other Ambulatory Visit: Payer: BC Managed Care – PPO

## 2019-02-25 ENCOUNTER — Other Ambulatory Visit: Payer: BC Managed Care – PPO

## 2019-02-28 ENCOUNTER — Ambulatory Visit
Admission: RE | Admit: 2019-02-28 | Discharge: 2019-02-28 | Disposition: A | Discharge status: Home / Self Care | Payer: BC Managed Care – PPO | Source: Ambulatory Visit | Attending: Family Medicine | Admitting: Family Medicine

## 2019-02-28 ENCOUNTER — Other Ambulatory Visit: Payer: Self-pay

## 2019-02-28 DIAGNOSIS — M5412 Radiculopathy, cervical region: Secondary | ICD-10-CM

## 2019-02-28 MED ORDER — TRIAMCINOLONE ACETONIDE 40 MG/ML IJ SUSP (RADIOLOGY)
60.0000 mg | Freq: Once | INTRAMUSCULAR | Status: AC
  Administered 2019-02-28: 60 mg via EPIDURAL

## 2019-02-28 MED ORDER — IOPAMIDOL (ISOVUE-M 300) INJECTION 61%
1.0000 mL | Freq: Once | INTRAMUSCULAR | Status: AC | PRN
  Administered 2019-02-28: 1 mL via EPIDURAL

## 2019-02-28 NOTE — Discharge Instructions (Signed)

## 2019-03-17 ENCOUNTER — Other Ambulatory Visit: Payer: Self-pay | Admitting: Physical Medicine and Rehabilitation

## 2019-03-17 DIAGNOSIS — M5412 Radiculopathy, cervical region: Secondary | ICD-10-CM

## 2019-03-30 ENCOUNTER — Other Ambulatory Visit: Payer: Self-pay | Admitting: Sports Medicine

## 2019-03-30 DIAGNOSIS — M76892 Other specified enthesopathies of left lower limb, excluding foot: Secondary | ICD-10-CM

## 2019-03-30 DIAGNOSIS — G8929 Other chronic pain: Secondary | ICD-10-CM

## 2019-03-30 DIAGNOSIS — W19XXXD Unspecified fall, subsequent encounter: Secondary | ICD-10-CM

## 2019-03-30 DIAGNOSIS — M25452 Effusion, left hip: Secondary | ICD-10-CM

## 2019-03-30 DIAGNOSIS — M25552 Pain in left hip: Secondary | ICD-10-CM

## 2019-03-30 DIAGNOSIS — M1612 Unilateral primary osteoarthritis, left hip: Secondary | ICD-10-CM

## 2019-04-05 ENCOUNTER — Ambulatory Visit
Admission: RE | Admit: 2019-04-05 | Discharge: 2019-04-05 | Disposition: A | Discharge status: Home / Self Care | Payer: BC Managed Care – PPO | Source: Ambulatory Visit | Attending: Physical Medicine and Rehabilitation | Admitting: Physical Medicine and Rehabilitation

## 2019-04-05 DIAGNOSIS — M5412 Radiculopathy, cervical region: Secondary | ICD-10-CM

## 2019-04-05 MED ORDER — IOPAMIDOL (ISOVUE-M 300) INJECTION 61%
1.0000 mL | Freq: Once | INTRAMUSCULAR | Status: AC
  Administered 2019-04-05: 1 mL via EPIDURAL

## 2019-04-05 MED ORDER — TRIAMCINOLONE ACETONIDE 40 MG/ML IJ SUSP (RADIOLOGY)
60.0000 mg | Freq: Once | INTRAMUSCULAR | Status: AC
  Administered 2019-04-05: 60 mg via EPIDURAL

## 2019-04-05 NOTE — Discharge Instructions (Signed)

## 2019-04-06 ENCOUNTER — Other Ambulatory Visit: Payer: Self-pay

## 2019-04-06 ENCOUNTER — Ambulatory Visit
Admission: RE | Admit: 2019-04-06 | Discharge: 2019-04-06 | Disposition: A | Discharge status: Home / Self Care | Payer: BC Managed Care – PPO | Source: Ambulatory Visit | Attending: Sports Medicine | Admitting: Sports Medicine

## 2019-04-06 DIAGNOSIS — M1612 Unilateral primary osteoarthritis, left hip: Secondary | ICD-10-CM | POA: Insufficient documentation

## 2019-04-06 DIAGNOSIS — G8929 Other chronic pain: Secondary | ICD-10-CM | POA: Diagnosis not present

## 2019-04-06 DIAGNOSIS — M25552 Pain in left hip: Secondary | ICD-10-CM | POA: Insufficient documentation

## 2019-04-06 DIAGNOSIS — M76892 Other specified enthesopathies of left lower limb, excluding foot: Secondary | ICD-10-CM | POA: Insufficient documentation

## 2019-04-06 DIAGNOSIS — M25452 Effusion, left hip: Secondary | ICD-10-CM

## 2019-04-06 DIAGNOSIS — W19XXXD Unspecified fall, subsequent encounter: Secondary | ICD-10-CM | POA: Insufficient documentation

## 2019-04-06 DIAGNOSIS — Z96641 Presence of right artificial hip joint: Secondary | ICD-10-CM | POA: Insufficient documentation

## 2019-04-09 ENCOUNTER — Ambulatory Visit: Payer: BC Managed Care – PPO

## 2019-05-05 ENCOUNTER — Other Ambulatory Visit: Payer: Self-pay

## 2019-05-05 ENCOUNTER — Emergency Department: Payer: BC Managed Care – PPO

## 2019-05-05 ENCOUNTER — Inpatient Hospital Stay
Admission: EM | Admit: 2019-05-05 | Discharge: 2019-05-13 | DRG: 208 | Payer: BC Managed Care – PPO | Attending: Internal Medicine | Admitting: Internal Medicine

## 2019-05-05 DIAGNOSIS — J069 Acute upper respiratory infection, unspecified: Secondary | ICD-10-CM | POA: Diagnosis not present

## 2019-05-05 DIAGNOSIS — Z885 Allergy status to narcotic agent status: Secondary | ICD-10-CM

## 2019-05-05 DIAGNOSIS — E876 Hypokalemia: Secondary | ICD-10-CM | POA: Diagnosis present

## 2019-05-05 DIAGNOSIS — M5116 Intervertebral disc disorders with radiculopathy, lumbar region: Secondary | ICD-10-CM | POA: Diagnosis present

## 2019-05-05 DIAGNOSIS — G9341 Metabolic encephalopathy: Secondary | ICD-10-CM | POA: Diagnosis not present

## 2019-05-05 DIAGNOSIS — Z881 Allergy status to other antibiotic agents status: Secondary | ICD-10-CM

## 2019-05-05 DIAGNOSIS — Z9109 Other allergy status, other than to drugs and biological substances: Secondary | ICD-10-CM

## 2019-05-05 DIAGNOSIS — Z791 Long term (current) use of non-steroidal anti-inflammatories (NSAID): Secondary | ICD-10-CM

## 2019-05-05 DIAGNOSIS — F329 Major depressive disorder, single episode, unspecified: Secondary | ICD-10-CM | POA: Diagnosis present

## 2019-05-05 DIAGNOSIS — G4733 Obstructive sleep apnea (adult) (pediatric): Secondary | ICD-10-CM | POA: Diagnosis present

## 2019-05-05 DIAGNOSIS — J45901 Unspecified asthma with (acute) exacerbation: Secondary | ICD-10-CM | POA: Diagnosis present

## 2019-05-05 DIAGNOSIS — Z91018 Allergy to other foods: Secondary | ICD-10-CM | POA: Diagnosis not present

## 2019-05-05 DIAGNOSIS — K224 Dyskinesia of esophagus: Secondary | ICD-10-CM | POA: Diagnosis present

## 2019-05-05 DIAGNOSIS — I10 Essential (primary) hypertension: Secondary | ICD-10-CM | POA: Diagnosis not present

## 2019-05-05 DIAGNOSIS — I2781 Cor pulmonale (chronic): Secondary | ICD-10-CM | POA: Diagnosis present

## 2019-05-05 DIAGNOSIS — Z978 Presence of other specified devices: Secondary | ICD-10-CM

## 2019-05-05 DIAGNOSIS — Z833 Family history of diabetes mellitus: Secondary | ICD-10-CM

## 2019-05-05 DIAGNOSIS — Z87891 Personal history of nicotine dependence: Secondary | ICD-10-CM

## 2019-05-05 DIAGNOSIS — F419 Anxiety disorder, unspecified: Secondary | ICD-10-CM | POA: Diagnosis present

## 2019-05-05 DIAGNOSIS — J80 Acute respiratory distress syndrome: Secondary | ICD-10-CM | POA: Diagnosis not present

## 2019-05-05 DIAGNOSIS — E872 Acidosis, unspecified: Secondary | ICD-10-CM | POA: Diagnosis present

## 2019-05-05 DIAGNOSIS — I82452 Acute embolism and thrombosis of left peroneal vein: Secondary | ICD-10-CM | POA: Diagnosis present

## 2019-05-05 DIAGNOSIS — J9600 Acute respiratory failure, unspecified whether with hypoxia or hypercapnia: Secondary | ICD-10-CM | POA: Diagnosis present

## 2019-05-05 DIAGNOSIS — E119 Type 2 diabetes mellitus without complications: Secondary | ICD-10-CM | POA: Diagnosis present

## 2019-05-05 DIAGNOSIS — K219 Gastro-esophageal reflux disease without esophagitis: Secondary | ICD-10-CM | POA: Diagnosis not present

## 2019-05-05 DIAGNOSIS — G43909 Migraine, unspecified, not intractable, without status migrainosus: Secondary | ICD-10-CM | POA: Diagnosis present

## 2019-05-05 DIAGNOSIS — R000 Tachycardia, unspecified: Secondary | ICD-10-CM | POA: Diagnosis present

## 2019-05-05 DIAGNOSIS — Z6841 Body Mass Index (BMI) 40.0 and over, adult: Secondary | ICD-10-CM | POA: Diagnosis not present

## 2019-05-05 DIAGNOSIS — I82409 Acute embolism and thrombosis of unspecified deep veins of unspecified lower extremity: Secondary | ICD-10-CM

## 2019-05-05 DIAGNOSIS — E041 Nontoxic single thyroid nodule: Secondary | ICD-10-CM | POA: Diagnosis present

## 2019-05-05 DIAGNOSIS — Z96641 Presence of right artificial hip joint: Secondary | ICD-10-CM | POA: Diagnosis present

## 2019-05-05 DIAGNOSIS — T797XXA Traumatic subcutaneous emphysema, initial encounter: Secondary | ICD-10-CM | POA: Diagnosis not present

## 2019-05-05 DIAGNOSIS — J9601 Acute respiratory failure with hypoxia: Secondary | ICD-10-CM

## 2019-05-05 DIAGNOSIS — Z96651 Presence of right artificial knee joint: Secondary | ICD-10-CM | POA: Diagnosis present

## 2019-05-05 DIAGNOSIS — U071 COVID-19: Secondary | ICD-10-CM | POA: Diagnosis present

## 2019-05-05 DIAGNOSIS — Z7951 Long term (current) use of inhaled steroids: Secondary | ICD-10-CM

## 2019-05-05 DIAGNOSIS — Z8041 Family history of malignant neoplasm of ovary: Secondary | ICD-10-CM

## 2019-05-05 DIAGNOSIS — Z9981 Dependence on supplemental oxygen: Secondary | ICD-10-CM

## 2019-05-05 DIAGNOSIS — M4726 Other spondylosis with radiculopathy, lumbar region: Secondary | ICD-10-CM | POA: Diagnosis present

## 2019-05-05 DIAGNOSIS — F418 Other specified anxiety disorders: Secondary | ICD-10-CM | POA: Diagnosis present

## 2019-05-05 DIAGNOSIS — J45909 Unspecified asthma, uncomplicated: Secondary | ICD-10-CM | POA: Diagnosis present

## 2019-05-05 DIAGNOSIS — M1712 Unilateral primary osteoarthritis, left knee: Secondary | ICD-10-CM | POA: Diagnosis present

## 2019-05-05 DIAGNOSIS — J9602 Acute respiratory failure with hypercapnia: Secondary | ICD-10-CM | POA: Diagnosis present

## 2019-05-05 DIAGNOSIS — Z91040 Latex allergy status: Secondary | ICD-10-CM

## 2019-05-05 DIAGNOSIS — Z8249 Family history of ischemic heart disease and other diseases of the circulatory system: Secondary | ICD-10-CM

## 2019-05-05 DIAGNOSIS — J1282 Pneumonia due to coronavirus disease 2019: Secondary | ICD-10-CM | POA: Diagnosis present

## 2019-05-05 DIAGNOSIS — Z91048 Other nonmedicinal substance allergy status: Secondary | ICD-10-CM

## 2019-05-05 DIAGNOSIS — Z79899 Other long term (current) drug therapy: Secondary | ICD-10-CM

## 2019-05-05 DIAGNOSIS — R739 Hyperglycemia, unspecified: Secondary | ICD-10-CM | POA: Diagnosis not present

## 2019-05-05 DIAGNOSIS — K649 Unspecified hemorrhoids: Secondary | ICD-10-CM | POA: Diagnosis present

## 2019-05-05 DIAGNOSIS — J9690 Respiratory failure, unspecified, unspecified whether with hypoxia or hypercapnia: Secondary | ICD-10-CM

## 2019-05-05 LAB — HEMOGLOBIN A1C
Hgb A1c MFr Bld: 7.6 % — ABNORMAL HIGH (ref 4.8–5.6)
Mean Plasma Glucose: 171.42 mg/dL

## 2019-05-05 LAB — COMPREHENSIVE METABOLIC PANEL
ALT: 27 U/L (ref 0–44)
AST: 56 U/L — ABNORMAL HIGH (ref 15–41)
Albumin: 3.2 g/dL — ABNORMAL LOW (ref 3.5–5.0)
Alkaline Phosphatase: 72 U/L (ref 38–126)
Anion gap: 13 (ref 5–15)
BUN: 27 mg/dL — ABNORMAL HIGH (ref 8–23)
CO2: 26 mmol/L (ref 22–32)
Calcium: 8.6 mg/dL — ABNORMAL LOW (ref 8.9–10.3)
Chloride: 95 mmol/L — ABNORMAL LOW (ref 98–111)
Creatinine, Ser: 0.95 mg/dL (ref 0.44–1.00)
GFR calc Af Amer: >60 mL/min (ref >60)
GFR calc non Af Amer: >60 mL/min (ref >60)
Glucose, Bld: 259 mg/dL — ABNORMAL HIGH (ref 70–99)
Potassium: 3.3 mmol/L — ABNORMAL LOW (ref 3.5–5.1)
Sodium: 134 mmol/L — ABNORMAL LOW (ref 135–145)
Total Bilirubin: 0.9 mg/dL (ref 0.3–1.2)
Total Protein: 6.9 g/dL (ref 6.5–8.1)

## 2019-05-05 LAB — CBC WITH DIFFERENTIAL/PLATELET
Abs Immature Granulocytes: 0.12 10*3/uL — ABNORMAL HIGH (ref 0.00–0.07)
Basophils Absolute: 0 10*3/uL (ref 0.0–0.1)
Basophils Relative: 0 %
Eosinophils Absolute: 0 10*3/uL (ref 0.0–0.5)
Eosinophils Relative: 0 %
HCT: 38.3 % (ref 36.0–46.0)
Hemoglobin: 12.1 g/dL (ref 12.0–15.0)
Immature Granulocytes: 1 %
Lymphocytes Relative: 6 %
Lymphs Abs: 0.6 10*3/uL — ABNORMAL LOW (ref 0.7–4.0)
MCH: 25 pg — ABNORMAL LOW (ref 26.0–34.0)
MCHC: 31.6 g/dL (ref 30.0–36.0)
MCV: 79.1 fL — ABNORMAL LOW (ref 80.0–100.0)
Monocytes Absolute: 0.4 10*3/uL (ref 0.1–1.0)
Monocytes Relative: 4 %
Neutro Abs: 10.3 10*3/uL — ABNORMAL HIGH (ref 1.7–7.7)
Neutrophils Relative %: 89 %
Platelets: 264 10*3/uL (ref 150–400)
RBC: 4.84 MIL/uL (ref 3.87–5.11)
RDW: 15.2 % (ref 11.5–15.5)
WBC: 11.4 10*3/uL — ABNORMAL HIGH (ref 4.0–10.5)
nRBC: 0 % (ref 0.0–0.2)

## 2019-05-05 LAB — LACTATE DEHYDROGENASE: LDH: 473 U/L — ABNORMAL HIGH (ref 98–192)

## 2019-05-05 LAB — LACTIC ACID, PLASMA
Lactic Acid, Venous: 3.4 mmol/L (ref 0.5–1.9)
Lactic Acid, Venous: 5.6 mmol/L (ref 0.5–1.9)

## 2019-05-05 LAB — FIBRINOGEN: Fibrinogen: 639 mg/dL — ABNORMAL HIGH (ref 210–475)

## 2019-05-05 LAB — GLUCOSE, CAPILLARY
Glucose-Capillary: 375 mg/dL — ABNORMAL HIGH (ref 70–99)
Glucose-Capillary: 259 mg/dL — ABNORMAL HIGH (ref 70–99)

## 2019-05-05 LAB — FERRITIN: Ferritin: 206 ng/mL (ref 11–307)

## 2019-05-05 LAB — FIBRIN DERIVATIVES D-DIMER (ARMC ONLY): Fibrin derivatives D-dimer (ARMC): 1868.17 ng/mL (FEU) — ABNORMAL HIGH (ref 0.00–499.00)

## 2019-05-05 LAB — PROCALCITONIN: Procalcitonin: <0.1 ng/mL

## 2019-05-05 LAB — C-REACTIVE PROTEIN: CRP: 9.8 mg/dL — ABNORMAL HIGH (ref <1.0)

## 2019-05-05 LAB — TRIGLYCERIDES: Triglycerides: 230 mg/dL — ABNORMAL HIGH (ref <150)

## 2019-05-05 MED ORDER — ACETAMINOPHEN 325 MG PO TABS: 650.0000 mg | ORAL_TABLET | Freq: Four times a day (QID) | ORAL | Status: DC | PRN

## 2019-05-05 MED ORDER — SODIUM CHLORIDE 0.9 % IV SOLN
200.0000 mg | Freq: Once | INTRAVENOUS | Status: AC
  Administered 2019-05-05: 200 mg via INTRAVENOUS
  Filled 2019-05-05: qty 200

## 2019-05-05 MED ORDER — POTASSIUM CHLORIDE CRYS ER 20 MEQ PO TBCR
40.0000 meq | EXTENDED_RELEASE_TABLET | Freq: Once | ORAL | Status: AC
  Administered 2019-05-05: 40 meq via ORAL
  Filled 2019-05-05: qty 2

## 2019-05-05 MED ORDER — LEVALBUTEROL TARTRATE 45 MCG/ACT IN AERO
2.0000 | INHALATION_SPRAY | Freq: Four times a day (QID) | RESPIRATORY_TRACT | Status: DC | PRN
  Filled 2019-05-05: qty 15

## 2019-05-05 MED ORDER — METHYLPREDNISOLONE SODIUM SUCC 40 MG IJ SOLR
40.0000 mg | Freq: Two times a day (BID) | INTRAMUSCULAR | Status: DC
  Administered 2019-05-06 (×2): 40 mg via INTRAVENOUS
  Filled 2019-05-05 (×2): qty 1

## 2019-05-05 MED ORDER — HYDRALAZINE HCL 50 MG PO TABS: 25.0000 mg | ORAL_TABLET | Freq: Three times a day (TID) | ORAL | Status: DC | PRN

## 2019-05-05 MED ORDER — SODIUM CHLORIDE 0.9 % IV SOLN: INTRAVENOUS | Status: DC

## 2019-05-05 MED ORDER — IPRATROPIUM BROMIDE HFA 17 MCG/ACT IN AERS
2.0000 | INHALATION_SPRAY | RESPIRATORY_TRACT | Status: DC
  Administered 2019-05-05 (×2): 2 via RESPIRATORY_TRACT
  Filled 2019-05-05 (×2): qty 12.9

## 2019-05-05 MED ORDER — SODIUM CHLORIDE 0.9 % IV SOLN
100.0000 mg | Freq: Every day | INTRAVENOUS | Status: AC
  Administered 2019-05-06 – 2019-05-09 (×4): 100 mg via INTRAVENOUS
  Filled 2019-05-05: qty 100
  Filled 2019-05-05: qty 20
  Filled 2019-05-05: qty 100
  Filled 2019-05-05: qty 20

## 2019-05-05 MED ORDER — DM-GUAIFENESIN ER 30-600 MG PO TB12
1.0000 | ORAL_TABLET | Freq: Two times a day (BID) | ORAL | Status: DC
  Administered 2019-05-05 – 2019-05-12 (×12): 1 via ORAL
  Filled 2019-05-05 (×13): qty 1

## 2019-05-05 MED ORDER — DEXAMETHASONE SODIUM PHOSPHATE 10 MG/ML IJ SOLN
8.0000 mg | Freq: Once | INTRAMUSCULAR | Status: AC
  Administered 2019-05-05: 8 mg via INTRAVENOUS
  Filled 2019-05-05: qty 1

## 2019-05-05 MED ORDER — INSULIN ASPART 100 UNIT/ML ~~LOC~~ SOLN
0.0000 [IU] | Freq: Every day | SUBCUTANEOUS | Status: DC
  Administered 2019-05-05: 3 [IU] via SUBCUTANEOUS
  Administered 2019-05-06: 2 [IU] via SUBCUTANEOUS
  Administered 2019-05-07 – 2019-05-09 (×3): 4 [IU] via SUBCUTANEOUS
  Administered 2019-05-10: 22:00:00 5 [IU] via SUBCUTANEOUS
  Administered 2019-05-11: 22:00:00 2 [IU] via SUBCUTANEOUS
  Filled 2019-05-05 (×5): qty 1

## 2019-05-05 MED ORDER — ONDANSETRON HCL 4 MG/2ML IJ SOLN
4.0000 mg | Freq: Three times a day (TID) | INTRAMUSCULAR | Status: DC | PRN
  Administered 2019-05-06 – 2019-05-08 (×3): 4 mg via INTRAVENOUS
  Filled 2019-05-05 (×3): qty 2

## 2019-05-05 MED ORDER — INSULIN ASPART 100 UNIT/ML ~~LOC~~ SOLN
0.0000 [IU] | Freq: Three times a day (TID) | SUBCUTANEOUS | Status: DC
  Administered 2019-05-05: 9 [IU] via SUBCUTANEOUS
  Administered 2019-05-05: 5 [IU] via SUBCUTANEOUS
  Administered 2019-05-07 – 2019-05-08 (×3): 3 [IU] via SUBCUTANEOUS
  Administered 2019-05-08: 11:00:00 7 [IU] via SUBCUTANEOUS
  Administered 2019-05-09: 3 [IU] via SUBCUTANEOUS
  Administered 2019-05-09: 7 [IU] via SUBCUTANEOUS
  Filled 2019-05-05 (×7): qty 1

## 2019-05-05 NOTE — ED Provider Notes (Signed)
Patient has been admitted for COVID PNA and hypoxia. Patient was previously on 6L [place]. She ambulated to the bathroom and desated to the 51s. Patient with normal mentation. Placed on NRB with some improvement but persistently hypoxic to the low 80s.  Patient placed on high flow nasal cannula with improvement of her sats to the low 90s.   Alfred Levins, [place], MD 05/06/19 [phone]

## 2019-05-05 NOTE — ED Notes (Signed)
Pt provided with hospital bed. Pt ambulated to toilet with steady gait, SHOB without O2 noted, placed on 5L [place]

## 2019-05-05 NOTE — H&P (Signed)
History and Physical    Deborah HalimMaryann C Miller ZOX:[id number] DOB: 1955/10/29 DOA: 05/05/2019  Referring MD/NP/PA:   PCP: Danella PentonMiller, Deborah F, MD   Patient coming from:  The patient is coming from home.  At baseline, pt is independent for most of ADL.        Chief Complaint: SOB  HPI: Deborah Miller is a 63 y.o. female with medical history significant of hypertension, diabetes mellitus, asthma, GERD, depression with anxiety, OSA, cor pulmonale, who presents with shortness of breath.  Patient states that she has been having cough and shortness of breath for nearly a week, she had a positive COVID-19 test. Her PCP started her on home oxygen on [place] and treated her with 80 mg of Kenalog. She states that she continues to have shortness of breath, which has been progressively worsening.  She has had some chest discomfort and congestion.  Patient reports nausea vomiting few days ago, which has resolved.  Currently no nausea, vomiting, diarrhea or abdominal pain.  No symptoms of UTI or unilateral weakness. She reports her home pulse oximetry was in the 70-80s.    Currently no fever or chills.  ED Course: pt was found to have WBC 11.4, lactic acid 3.4, potassium 3.3, renal function normal, temperature 98.3, blood pressure 134/77, tachycardia, oxygen saturation 94% on 4 L nasal cannula oxygen.  Chest x-ray showed a bilateral infiltration.  Patient is admitted to MedSurg bed as inpatient.  Review of Systems:   General: no fevers, chills, no body weight gain, has fatigue HEENT: no blurry vision, hearing changes or sore throat Respiratory: has dyspnea, coughing, no wheezing CV: no chest pain, no palpitations GI: had nausea, vomiting, no abdominal pain, diarrhea, constipation GU: no dysuria, burning on urination, increased urinary frequency, hematuria  Ext: no leg edema Neuro: no unilateral weakness, numbness, or tingling, no vision change or hearing loss Skin: no rash, no skin tear. MSK: No muscle spasm, no  deformity, no limitation of range of movement in spin Heme: No easy bruising.  Travel history: No recent long distant travel.  Allergy:  Allergies  Allergen Reactions  . Citrus     Itchy throat   . Pork-Derived Products     Itchy throat   . Augmentin [Amoxicillin-Pot Clavulanate] Nausea And Vomiting    Has patient had a PCN reaction causing immediate rash, facial/tongue/throat swelling, SOB or lightheadedness with hypotension: No Has patient had a PCN reaction causing severe rash involving mucus membranes or skin necrosis: No Has patient had a PCN reaction that required hospitalization: No Has patient had a PCN reaction occurring within the last 10 years: Yes If all of the above answers are "NO", then may proceed with Cephalosporin use.   . Latex Other (See Comments)    Blisters NOTE: Latex IgE NEGATIVE (<0.10) Around mouth causes a rash  . Morphine And Related Nausea And Vomiting    Patient does not want morphine at all.  . Cefadroxil Other (See Comments)    Severe cramping in side. Felt like ribs were going to explode  . Other Other (See Comments)    Lettuce causes her to start with diverticulitis  . Pollen Extract Other (See Comments)    Trees, grass, mold, dust causes raspy voice, nasal irritation  . Tape Other (See Comments)    Blisters. Paper tape is acceptable    Past Medical History:  Diagnosis Date  . Acid reflux   . Allergic genetic state   . Anxiety   . Arthritis   .  Asthma 1957  . Breast microcalcification, mammographic 2014  . Breast screening, unspecified   . Carpal tunnel syndrome 1999   repaired left wrist  . Cor pulmonale ([place])   . DDD (degenerative disc disease), lumbar   . Depression   . Diabetes mellitus without complication ([place]) 3532   borderline, follows diabetic diet often  . Environmental allergies   . Esophageal spasm   . Fecal smearing   . Headache    sinus migraines. takes OTC meds for allergies  . Heart murmur    as a child, not  treated  . Hemorrhoids 2012  . History of hiatal hernia   . Hypertension   . Lump in thyroid 2019   having an ultrasound 10/2017 to rule out tumor or salivary gland enlargement  . Morbid obesity ([place])   . Osteoarthritis of spine with radiculopathy, lumbar region   . Other dysphagia   . Pain    chronic lbp,stenosis  . PONV (postoperative nausea and vomiting)    nausea and vomitting due to morphine  . Primary osteoarthritis of left knee   . Primary osteoarthritis of right hip   . Right thyroid nodule   . Shortness of breath dyspnea    increased with anxiety  . Sleep apnea    cpap    Past Surgical History:  Procedure Laterality Date  . ANTERIOR FUSION CERVICAL SPINE    . BACK SURGERY  2010   C 5 & 6 fusion  . BREAST BIOPSY Left    stereo  . BREAST EXCISIONAL BIOPSY Left 2014   benign   . BUNIONECTOMY Left 1996   pin removed  . carpal tunnel--left hand  Left 2012  . CHOLECYSTECTOMY  2002  . COLONOSCOPY  2009   Dr. Vira Agar  . COLONOSCOPY WITH PROPOFOL N/A 08/20/2016   Procedure: COLONOSCOPY WITH PROPOFOL;  Surgeon: Robert Bellow, MD;  Location: [facility] ENDOSCOPY;  Service: Endoscopy;  Laterality: N/A;  . DILATION AND CURETTAGE OF UTERUS  1987  . ESOPHAGOGASTRODUODENOSCOPY (EGD) WITH PROPOFOL N/A 04/30/2018   Procedure: ESOPHAGOGASTRODUODENOSCOPY (EGD) WITH PROPOFOL;  Surgeon: Manya Silvas, MD;  Location: [facility] ENDOSCOPY;  Service: Endoscopy;  Laterality: N/A;  . ganglion cyst removal  Left 1988  . JOINT REPLACEMENT Right 2016   TOTAL HIP ARTHROPLASTY by dr. Marry Guan  . KNEE ARTHROPLASTY Right 10/28/2017   Procedure: COMPUTER ASSISTED TOTAL KNEE ARTHROPLASTY;  Surgeon: Dereck Leep, MD;  Location: [facility] ORS;  Service: Orthopedics;  Laterality: Right;  . KNEE SURGERY Left 2014   partial knee replacement by dr. Leanor Kail  . left breast biopsy Left 2006   mammary glands swollen  . MOUTH LESION EXCISIONAL BIOPSY    . NASAL SEPTUM SURGERY  1975   deviated septum  repaired  . neck injection Right 2014   block for inflammed nerve. repeated in 2018 on left side for same thing. done by dr. Fuller Mandril  . NOSE SURGERY  2001   clean out of nasal passages  . TONSILLECTOMY  1991   uvula removed also for OSA  . tonsilloadenoidectomy  2003   uvula also for OSA. titanium screw on bottom of lower jaw  . TOTAL HIP ARTHROPLASTY Right 02/14/2015   Procedure: TOTAL HIP ARTHROPLASTY;  Surgeon: Dereck Leep, MD;  Location: [facility] ORS;  Service: Orthopedics;  Laterality: Right;  . TUBAL LIGATION    . UPPER GI ENDOSCOPY  2009, 2015   Dr Vira Agar / Dr Bary Castilla    Social History:  reports that she quit  smoking about 32 years ago. She has a 10.00 pack-year smoking history. She has never used smokeless tobacco. She reports current alcohol use. She reports that she does not use drugs.  Family History:  Family History  Problem Relation Age of Onset  . Ovarian cancer Maternal Grandmother   . Diabetes Father   . Heart disease Father   . Breast cancer Neg Hx   . Colon cancer Neg Hx      Prior to Admission medications   Medication Sig Start Date End Date Taking? Authorizing Provider  acetaminophen (TYLENOL) 500 MG tablet Take 650 mg by mouth every 6 (six) hours as needed for moderate pain or headache. If no relief of pain within one hour, patient takes a second one.    [provider]  ADVAIR [facility] 161-09 MCG/ACT inhaler Inhale 2 puffs into the lungs 2 (two) times daily.  10/10/12   [provider]  albuterol (PROVENTIL HFA;VENTOLIN HFA) 108 (90 BASE) MCG/ACT inhaler Inhale 2 puffs into the lungs every 6 (six) hours as needed for wheezing or shortness of breath.     [provider]  ALPRAZolam Prudy Feeler) 0.25 MG tablet Take 1 tablet (0.25 mg total) by mouth daily. 11/02/17   Lorenso Quarry, NP  b complex vitamins tablet Take 1 tablet by mouth daily.    [provider]  calcium carbonate (TUMS - DOSED IN MG ELEMENTAL CALCIUM) 500 MG chewable tablet  Chew 1-3 tablets by mouth 2 (two) times daily as needed for indigestion or heartburn.     [provider]  Calcium Carbonate-Vitamin D (CALCIUM 600 + D PO) Take 1 tablet by mouth daily.    [provider]  cetirizine (ZYRTEC) 10 MG tablet Take 10 mg by mouth daily.    [provider]  Chlorpheniramine-DM (CORICIDIN COUGH/COLD) 4-30 MG TABS Take 1 tablet by mouth 2 (two) times daily as needed (cough / cold symptoms).    [provider]  cholestyramine light (PREVALITE) 4 GM/DOSE powder Take 4 g by mouth daily as needed (GI distress).     [provider]  docusate sodium (COLACE) 100 MG capsule Take 100 mg by mouth daily.     [provider]  enoxaparin (LOVENOX) 30 MG/0.3ML injection Inject 0.3 mLs (30 mg total) into the skin every 12 (twelve) hours for 14 days. 10/31/17 11/14/17  Tera Partridge, PA  EPINEPHrine (AUVI-Q) 0.3 mg/0.3 mL IJ SOAJ injection Inject 0.3 mg into the muscle daily as needed.     [provider]  gabapentin (NEURONTIN) 300 MG capsule Take 300 mg by mouth 2 (two) times daily.  10/10/12   [provider]  L-Lysine 500 MG CAPS Take 500 mg by mouth daily.     [provider]  loratadine (CLARITIN) 10 MG tablet Take 10 mg by mouth daily.     [provider]  Melatonin 3 MG TABS Take 3 mg by mouth at bedtime.     [provider]  meloxicam (MOBIC) 15 MG tablet Take 15 mg by mouth daily.    [provider]  Multiple Vitamin (MULTIVITAMIN) tablet Take 1 tablet by mouth daily.    [provider]  omeprazole (PRILOSEC) 20 MG capsule Take 20 mg by mouth daily.     [provider]  oxyCODONE (OXY IR/ROXICODONE) 5 MG immediate release tablet Take 1 tablet (5 mg total) by mouth every 4 (four) hours as needed for moderate pain (pain score 4-6). Patient not taking: Reported on 11/12/2018  10/29/17   Tera Partridge, PA  polyvinyl alcohol (LIQUIFILM TEARS) 1.4 % ophthalmic solution  Place 1 drop into both eyes 3 (three) times daily as needed for dry eyes.     [provider]  potassium chloride (K-DUR) 10 MEQ tablet Take 10 mEq by mouth daily.     [provider]  pramipexole (MIRAPEX) 0.125 MG tablet Take 0.125 mg by mouth at bedtime.     [provider]  sodium chloride (OCEAN) 0.65 % SOLN nasal spray Place 1 spray into both nostrils every 2 (two) hours as needed for congestion.     [provider]  SPIRIVA HANDIHALER 18 MCG inhalation capsule Place 1 capsule into inhaler and inhale daily. 10/22/12   [provider]  torsemide (DEMADEX) 20 MG tablet Take 20 mg by mouth daily.  10/22/12   [provider]  traMADol (ULTRAM) 50 MG tablet Take 1-2 tablets (50-100 mg total) by mouth every 4 (four) hours as needed for moderate pain. 11/09/17   Sharee Holster, NP  valACYclovir (VALTREX) 1000 MG tablet Take 2,000 mg by mouth 2 (two) times daily as needed (fever blisters). Take for only 1 day for fever blister symptoms 05/23/16   [provider]    Physical Exam: Vitals:   05/05/19 1442 05/05/19 1500 05/05/19 1530 05/05/19 1600  BP:  123/77 101/65 121/72  Pulse:  95 93 100  Resp:  (!) 31 (!) 30 (!) 22  Temp: 98.3 F (36.8 C)     TempSrc: Oral     SpO2:  94% 96% 95%  Weight:      Height:       General: Not in acute distress HEENT:       Eyes: PERRL, EOMI, no scleral icterus.       ENT: No discharge from the ears and nose, no pharynx injection, no tonsillar enlargement.        Neck: No JVD, no bruit, no mass felt. Heme: No neck lymph node enlargement. Cardiac: S1/S2, RRR, No murmurs, No gallops or rubs. Respiratory: No rales, wheezing, rhonchi or rubs. GI: Soft, nondistended, nontender, no rebound pain, no organomegaly, BS present. GU: No hematuria Ext: No pitting leg edema bilaterally. 2+DP/PT pulse bilaterally. Musculoskeletal: No joint deformities, No joint redness or warmth, no limitation of ROM in  spin. Skin: No rashes.  Neuro: Alert, oriented X3, cranial nerves II-XII grossly intact, moves all extremities normally. Psych: Patient is not psychotic, no suicidal or hemocidal ideation.  Labs on Admission: I have personally reviewed following labs and imaging studies  CBC: Recent Labs  Lab 05/05/19 1248  WBC 11.4*  NEUTROABS 10.3*  HGB 12.1  HCT 38.3  MCV 79.1*  PLT 264   Basic Metabolic Panel: Recent Labs  Lab 05/05/19 1248  NA 134*  K 3.3*  CL 95*  CO2 26  GLUCOSE 259*  BUN 27*  CREATININE 0.95  CALCIUM 8.6*   GFR: Estimated Creatinine Clearance: 54.9 mL/min (by C-G formula based on SCr of 0.95 mg/dL). Liver Function Tests: Recent Labs  Lab 05/05/19 1248  AST 56*  ALT 27  ALKPHOS 72  BILITOT 0.9  PROT 6.9  ALBUMIN 3.2*   No results for input(s): LIPASE, AMYLASE in the last 168 hours. No results for input(s): AMMONIA in the last 168 hours. Coagulation Profile: No results for input(s): INR, PROTIME in the last 168 hours. Cardiac Enzymes: No results for input(s): CKTOTAL, CKMB, CKMBINDEX, TROPONINI in the last 168 hours. BNP (last 3 results) No  results for input(s): PROBNP in the last 8760 hours. HbA1C: No results for input(s): HGBA1C in the last 72 hours. CBG: No results for input(s): GLUCAP in the last 168 hours. Lipid Profile: Recent Labs    05/05/19 1231  TRIG 230*   Thyroid Function Tests: No results for input(s): TSH, T4TOTAL, FREET4, T3FREE, THYROIDAB in the last 72 hours. Anemia Panel: Recent Labs    05/05/19 1248  FERRITIN 206   Urine analysis:    Component Value Date/Time   COLORURINE STRAW (A) 10/30/2017 1144   APPEARANCEUR CLEAR (A) 10/30/2017 1144   LABSPEC 1.006 10/30/2017 1144   PHURINE 5.0 10/30/2017 1144   GLUCOSEU NEGATIVE 10/30/2017 1144   HGBUR SMALL (A) 10/30/2017 1144   BILIRUBINUR NEGATIVE 10/30/2017 1144   KETONESUR NEGATIVE 10/30/2017 1144   PROTEINUR NEGATIVE 10/30/2017 1144   NITRITE NEGATIVE 10/30/2017  1144   LEUKOCYTESUR NEGATIVE 10/30/2017 1144   Sepsis Labs: (procalcitonin:4,lacticidven:4) )No results found for this or any previous visit (from the past 240 hour(s)).   Radiological Exams on Admission: DG Chest Port 1 View  Result Date: 05/05/2019 CLINICAL DATA:  Sent from dr Hyacinth Meeker. + covid, bilateral PNA. Put on oxygen for by PCP Monday, sats in low 80s at office on 3 L [place]. Received 80 mg kenalog there EXAM: PORTABLE CHEST 1 VIEW COMPARISON:  Chest radiograph 03/03/2014 FINDINGS: Stable cardiomediastinal contours with enlarged heart size. There are new diffuse bilateral patchy airspace opacities. No pneumothorax or large pleural effusion. No acute finding in the visualized skeleton. IMPRESSION: 1. New diffuse bilateral patchy airspace opacities most likely representing multifocal pneumonia. 2. Cardiomegaly. Electronically Signed   By: Emmaline Kluver M.D.   On: 05/05/2019 12:41     EKG: Independently reviewed.  Sinus rhythm, QTC 483, LAD, nonspecific T wave change.  Assessment/Plan Principal Problem:   Acute respiratory disease due to COVID-19 virus Active Problems:   Esophageal reflux   Asthma   HTN (hypertension)   Depression with anxiety   Lactic acidosis   Hypokalemia   Acute respiratory disease due to COVID-19 virus: Oxygen desaturated to 70-80s on RA --> 94% on 4 L nasal cannula oxygen.  Chest x-ray is positive for bilateral infiltration. -will admit to med-surg bed  as inpt -Remdesivir per pharm -Solumedrol 40 mg bid -vitamin C, zinc.   -Bronchodilators -PRN Mucinex for cough -f/u Blood culture -Gentle IV fluid -D-dimer, BNP,Trop, LFT, CRP, LDH, Procalcitonin, Ferritin, fibinogen, TG, Hep B SAg, HIV ab -Daily CRP, Ferritin, D-dimer, -Will ask the patient to maintain an awake prone position for 16+ hours a day, if possible, with a minimum of 2-3 hours at a time -Will attempt to maintain euvolemia to a net negative fluid status -IF patient deteriorates,  will consult PCCM and ID  Esophageal reflux: -PPI  Asthma: -Bronchodilators  HTN:  --hold torsemid -hydralazine prn  Depression with anxiety: No SI or HI -continue home pramipexole, Xanax  Lactic acidosis: lactic acid 3.4 -on gentle IVF: NS at 75 cc/h  Hypokalemia: K= 3.3 on admission. - Repleted   Inpatient status:  # Patient requires inpatient status due to high intensity of service, high risk for further deterioration and high frequency of surveillance required.  I certify that at the point of admission it is my clinical judgment that the patient will require inpatient hospital care spanning beyond 2 midnights from the point of admission.  . This patient has multiple chronic comorbidities including hypertension, diabetes mellitus, asthma, GERD, depression with anxiety, OSA, cor pulmonale . Now patient has presenting  with Acute respiratory disease due to COVID-19 virus with hypoxia . The initial radiographic and laboratory data are worrisome because of elevated lactic acid, positive chest x-ray for bilateral infiltration.  Leukocytosis, hypokalemia . Current medical needs: please see my assessment and plan . Predictability of an adverse outcome (risk): patient has multiple comorbidities as listed above.  Now presents with acute respiratory disease due to COVID-19 virus with hypoxia. Also has elevated lactic acid, hypokalemia. Her presentation is highly complicated.  Patient is at high risk of deteriorating.  Will need to be treated in hospital for at least 2 days.              DVT ppx: SCD Code Status: Full code Family Communication: None at bed side.      Disposition Plan:  Anticipate discharge back to previous home environment Consults called:  none Admission status: Med-surg bed as inpt   Date of Service 05/05/2019    Lorretta Harp [facility]   If 7PM-7AM, please contact night-coverage [email or web address] Password [email or web address] 05/05/2019, 5:21 PM

## 2019-05-05 NOTE — ED Provider Notes (Signed)
[facility] Emergency Department Provider Note   ____________________________________________    I have reviewed the triage vital signs and the nursing notes.   HISTORY  Chief Complaint bilateral PNA, + covid, hypoxia     HPI Deborah Miller is a 63 y.o. female with known diagnoses of novel coronavirus presents with worsening shortness of breath.  Currently her PCP started her on home oxygen last week she has become markedly more short of breath in the last several days.  She reports her home pulse oximetry was in the 70s.  Does report chills, myalgias.  No nausea or vomiting.  Past medical history listed below  Past Medical History:  Diagnosis Date  . Acid reflux   . Allergic genetic state   . Anxiety   . Arthritis   . Asthma 1957  . Breast microcalcification, mammographic 2014  . Breast screening, unspecified   . Carpal tunnel syndrome 1999   repaired left wrist  . Cor pulmonale (HCC)   . DDD (degenerative disc disease), lumbar   . Depression   . Diabetes mellitus without complication (HCC) 2019   borderline, follows diabetic diet often  . Environmental allergies   . Esophageal spasm   . Fecal smearing   . Headache    sinus migraines. takes OTC meds for allergies  . Heart murmur    as a child, not treated  . Hemorrhoids 2012  . History of hiatal hernia   . Hypertension   . Lump in thyroid 2019   having an ultrasound 10/2017 to rule out tumor or salivary gland enlargement  . Morbid obesity (HCC)   . Osteoarthritis of spine with radiculopathy, lumbar region   . Other dysphagia   . Pain    chronic lbp,stenosis  . PONV (postoperative nausea and vomiting)    nausea and vomitting due to morphine  . Primary osteoarthritis of left knee   . Primary osteoarthritis of right hip   . Right thyroid nodule   . Shortness of breath dyspnea    increased with anxiety  . Sleep apnea    cpap    Patient Active Problem List   Diagnosis Date  Noted  . Acute respiratory disease due to COVID-19 virus 05/05/2019  . HTN (hypertension) 05/05/2019  . Depression with anxiety 05/05/2019  . Lactic acidosis 05/05/2019  . S/P total knee arthroplasty 10/28/2017  . Diastasis recti 07/25/2016  . Change in bowel habits 07/25/2016  . Benign essential hypertension 05/01/2016  . Obesity, Class III, BMI 40-49.9 (morbid obesity) (HCC) 11/29/2015  . History of abnormal cervical Pap smear 11/29/2015  . Family history of osteoporosis 11/29/2015  . Menopausal syndrome 11/29/2015  . RAD (reactive airway disease), moderate persistent, uncomplicated 10/31/2015  . Controlled type 2 diabetes mellitus without complication, without long-term current use of insulin (HCC) 05/01/2015  . S/P total hip arthroplasty 02/14/2015  . Lumbar stenosis with neurogenic claudication 11/30/2014  . DDD (degenerative disc disease), lumbar 11/16/2014  . Status post left partial knee replacement 04/02/2014  . Esophageal reflux 03/21/2014  . Cor pulmonale (HCC) 10/23/2013  . OSA (obstructive sleep apnea) 10/23/2013  . Other screening mammogram 05/26/2013  . Breast microcalcification, mammographic 10/28/2012  . Superficial skin lesion 10/28/2012  . Asthma 05[birth year]06/1955    Past Surgical History:  Procedure Laterality Date  . ANTERIOR FUSION CERVICAL SPINE    . BACK SURGERY  2010   C 5 & 6 fusion  . BREAST BIOPSY Left    stereo  . BREAST  EXCISIONAL BIOPSY Left 2014   benign   . BUNIONECTOMY Left 1996   pin removed  . carpal tunnel--left hand  Left 2012  . CHOLECYSTECTOMY  2002  . COLONOSCOPY  2009   Dr. Mechele Collin  . COLONOSCOPY WITH PROPOFOL N/A 08/20/2016   Procedure: COLONOSCOPY WITH PROPOFOL;  Surgeon: Earline Mayotte, MD;  Location: [facility] ENDOSCOPY;  Service: Endoscopy;  Laterality: N/A;  . DILATION AND CURETTAGE OF UTERUS  1987  . ESOPHAGOGASTRODUODENOSCOPY (EGD) WITH PROPOFOL N/A 04/30/2018   Procedure: ESOPHAGOGASTRODUODENOSCOPY (EGD) WITH PROPOFOL;  Surgeon:  Scot Jun, MD;  Location: [facility] ENDOSCOPY;  Service: Endoscopy;  Laterality: N/A;  . ganglion cyst removal  Left 1988  . JOINT REPLACEMENT Right 2016   TOTAL HIP ARTHROPLASTY by dr. Ernest Pine  . KNEE ARTHROPLASTY Right 10/28/2017   Procedure: COMPUTER ASSISTED TOTAL KNEE ARTHROPLASTY;  Surgeon: Donato Heinz, MD;  Location: [facility] ORS;  Service: Orthopedics;  Laterality: Right;  . KNEE SURGERY Left 2014   partial knee replacement by dr. Erin Sons  . left breast biopsy Left 2006   mammary glands swollen  . MOUTH LESION EXCISIONAL BIOPSY    . NASAL SEPTUM SURGERY  1975   deviated septum repaired  . neck injection Right 2014   block for inflammed nerve. repeated in 2018 on left side for same thing. done by dr. Lowella Dandy  . NOSE SURGERY  2001   clean out of nasal passages  . TONSILLECTOMY  1991   uvula removed also for OSA  . tonsilloadenoidectomy  2003   uvula also for OSA. titanium screw on bottom of lower jaw  . TOTAL HIP ARTHROPLASTY Right 02/14/2015   Procedure: TOTAL HIP ARTHROPLASTY;  Surgeon: Donato Heinz, MD;  Location: [facility] ORS;  Service: Orthopedics;  Laterality: Right;  . TUBAL LIGATION    . UPPER GI ENDOSCOPY  2009, 2015   Dr Mechele Collin / Dr Lemar Livings    Prior to Admission medications   Medication Sig Start Date End Date Taking? Authorizing Provider  acetaminophen (TYLENOL) 500 MG tablet Take 650 mg by mouth every 6 (six) hours as needed for moderate pain or headache. If no relief of pain within one hour, patient takes a second one.    [provider]  ADVAIR [facility] 073-71 MCG/ACT inhaler Inhale 2 puffs into the lungs 2 (two) times daily.  10/10/12   [provider]  albuterol (PROVENTIL HFA;VENTOLIN HFA) 108 (90 BASE) MCG/ACT inhaler Inhale 2 puffs into the lungs every 6 (six) hours as needed for wheezing or shortness of breath.     [provider]  ALPRAZolam Prudy Feeler) 0.25 MG tablet Take 1 tablet (0.25 mg total) by mouth daily. 11/02/17   Lorenso Quarry, NP  b complex vitamins tablet Take 1 tablet by mouth daily.    [provider]  calcium carbonate (TUMS - DOSED IN MG ELEMENTAL CALCIUM) 500 MG chewable tablet Chew 1-3 tablets by mouth 2 (two) times daily as needed for indigestion or heartburn.     [provider]  Calcium Carbonate-Vitamin D (CALCIUM 600 + D PO) Take 1 tablet by mouth daily.    [provider]  cetirizine (ZYRTEC) 10 MG tablet Take 10 mg by mouth daily.    [provider]  Chlorpheniramine-DM (CORICIDIN COUGH/COLD) 4-30 MG TABS Take 1 tablet by mouth 2 (two) times daily as needed (cough / cold symptoms).    [provider]  cholestyramine light (PREVALITE) 4 GM/DOSE powder Take 4 g by mouth daily as needed (  GI distress).     [provider]  docusate sodium (COLACE) 100 MG capsule Take 100 mg by mouth daily.     [provider]  enoxaparin (LOVENOX) 30 MG/0.3ML injection Inject 0.3 mLs (30 mg total) into the skin every 12 (twelve) hours for 14 days. 10/31/17 11/14/17  Watt Climes, PA  EPINEPHrine (AUVI-Q) 0.3 mg/0.3 mL IJ SOAJ injection Inject 0.3 mg into the muscle daily as needed.     [provider]  gabapentin (NEURONTIN) 300 MG capsule Take 300 mg by mouth 2 (two) times daily.  10/10/12   [provider]  L-Lysine 500 MG CAPS Take 500 mg by mouth daily.     [provider]  loratadine (CLARITIN) 10 MG tablet Take 10 mg by mouth daily.     [provider]  Melatonin 3 MG TABS Take 3 mg by mouth at bedtime.     [provider]  meloxicam (MOBIC) 15 MG tablet Take 15 mg by mouth daily.    [provider]  Multiple Vitamin (MULTIVITAMIN) tablet Take 1 tablet by mouth daily.    [provider]  omeprazole (PRILOSEC) 20 MG capsule Take 20 mg by mouth daily.     [provider]  oxyCODONE (OXY IR/ROXICODONE) 5 MG immediate release tablet Take 1 tablet (5 mg total) by mouth every 4 (four) hours  as needed for moderate pain (pain score 4-6). Patient not taking: Reported on 11/12/2018 10/29/17   Watt Climes, PA  polyvinyl alcohol (LIQUIFILM TEARS) 1.4 % ophthalmic solution Place 1 drop into both eyes 3 (three) times daily as needed for dry eyes.     [provider]  potassium chloride (K-DUR) 10 MEQ tablet Take 10 mEq by mouth daily.     [provider]  pramipexole (MIRAPEX) 0.125 MG tablet Take 0.125 mg by mouth at bedtime.     [provider]  sodium chloride (OCEAN) 0.65 % SOLN nasal spray Place 1 spray into both nostrils every 2 (two) hours as needed for congestion.     [provider]  SPIRIVA HANDIHALER 18 MCG inhalation capsule Place 1 capsule into inhaler and inhale daily. 10/22/12   [provider]  torsemide (DEMADEX) 20 MG tablet Take 20 mg by mouth daily.  10/22/12   [provider]  traMADol (ULTRAM) 50 MG tablet Take 1-2 tablets (50-100 mg total) by mouth every 4 (four) hours as needed for moderate pain. 11/09/17   Gerlene Fee, NP  valACYclovir (VALTREX) 1000 MG tablet Take 2,000 mg by mouth 2 (two) times daily as needed (fever blisters). Take for only 1 day for fever blister symptoms 05/23/16   [provider]     Allergies Citrus, Pork-derived products, Augmentin [amoxicillin-pot clavulanate], Latex, Morphine and related, Cefadroxil, Other, Pollen extract, and Tape  Family History  Problem Relation Age of Onset  . Ovarian cancer Maternal Grandmother   . Diabetes Father   . Heart disease Father   . Breast cancer Neg Hx   . Colon cancer Neg Hx     Social History Social History   Tobacco Use  . Smoking status: Former Smoker    Packs/day: 1.00    Years: 10.00    Pack years: 10.00    Quit date: 01/24/1987    Years since quitting: 32.2  . Smokeless tobacco: Never Used  Substance Use Topics  . Alcohol use: Yes    Comment: occassional   . Drug use: No  Review of Systems  Constitutional: As  above Eyes: No visual changes.  ENT: No sore throat. Cardiovascular: Some chest tightness Respiratory: As above Gastrointestinal: No abdominal pain.    Genitourinary: Negative for dysuria. Musculoskeletal: Negative for back pain. Skin: Negative for rash. Neurological: Negative for headaches   ____________________________________________   PHYSICAL EXAM:  VITAL SIGNS: ED Triage Vitals  Enc Vitals Group     BP 05/05/19 1230 134/77     Pulse Rate 05/05/19 1230 (!) 109     Resp 05/05/19 1230 (!) 25     Temp 05/05/19 1442 98.3 F (36.8 C)     Temp Source 05/05/19 1442 Oral     SpO2 05/05/19 1230 94 %     Weight 05/05/19 1153 (S) 92.5 kg (204 lb)     Height 05/05/19 1153 1.397 m ( )     Head Circumference --      Peak Flow --      Pain Score 05/05/19 1153 0     Pain Loc --      Pain Edu? --      Excl. in GC? --     Constitutional: Alert and oriented.e  Nose: No congestion/rhinnorhea. Mouth/Throat: Mucous membranes are moist.    Cardiovascular: Tachycardia, regular rhythm. Grossly normal heart sounds.  Good peripheral circulation. Respiratory: Increased respiratory effort with tachypnea Gastrointestinal: Soft and nontender. No distention.    Musculoskeletal: Warm and well perfused Neurologic:  Normal speech and language. No gross focal neurologic deficits are appreciated.  Skin:  Skin is warm, dry and intact. No rash noted. Psychiatric: Mood and affect are normal. Speech and behavior are normal.  ____________________________________________   LABS (all labs ordered are listed, but only abnormal results are displayed)  Labs Reviewed  LACTIC ACID, PLASMA - Abnormal; Notable for the following components:      Result Value   Lactic Acid, Venous 3.4 (*)    All other components within normal limits  CBC WITH DIFFERENTIAL/PLATELET - Abnormal; Notable for the following components:   WBC 11.4 (*)    MCV 79.1 (*)    MCH 25.0 (*)    Neutro Abs 10.3 (*)    Lymphs  Abs 0.6 (*)    Abs Immature Granulocytes 0.12 (*)    All other components within normal limits  COMPREHENSIVE METABOLIC PANEL - Abnormal; Notable for the following components:   Sodium 134 (*)    Potassium 3.3 (*)    Chloride 95 (*)    Glucose, Bld 259 (*)    BUN 27 (*)    Calcium 8.6 (*)    Albumin 3.2 (*)    AST 56 (*)    All other components within normal limits  FIBRIN DERIVATIVES D-DIMER ([facility] ONLY) - Abnormal; Notable for the following components:   Fibrin derivatives D-dimer [facility]) 1,610.96 (*)    All other components within normal limits  LACTATE DEHYDROGENASE - Abnormal; Notable for the following components:   LDH 473 (*)    All other components within normal limits  TRIGLYCERIDES - Abnormal; Notable for the following components:   Triglycerides 230 (*)    All other components within normal limits  FIBRINOGEN - Abnormal; Notable for the following components:   Fibrinogen 639 (*)    All other components within normal limits  CULTURE, BLOOD (ROUTINE X 2)  CULTURE, BLOOD (ROUTINE X 2)  PROCALCITONIN  FERRITIN  LACTIC ACID, PLASMA  C-REACTIVE PROTEIN   ____________________________________________  EKG  ED ECG REPORT I, Jene Every, the attending physician,  personally viewed and interpreted this ECG.  Date: 05/05/2019  Rhythm: normal sinus rhythm QRS Axis: normal Intervals: normal ST/T Wave abnormalities: normal Narrative Interpretation: no evidence of acute ischemia  ____________________________________________  RADIOLOGY  Chest x-ray consistent with multifocal pneumonia ____________________________________________   PROCEDURES  Procedure(s) performed: No  Procedures   Critical Care performed: yes  CRITICAL CARE Performed by: Jene Every   Total critical care time: 30 minutes  Critical care time was exclusive of separately billable procedures and treating other patients.  Critical care was necessary to treat or prevent imminent or  life-threatening deterioration.  Critical care was time spent personally by me on the following activities: development of treatment plan with patient and/or surrogate as well as nursing, discussions with consultants, evaluation of patient's response to treatment, examination of patient, obtaining history from patient or surrogate, ordering and performing treatments and interventions, ordering and review of laboratory studies, ordering and review of radiographic studies, pulse oximetry and re-evaluation of patient's condition.  ____________________________________________   INITIAL IMPRESSION / ASSESSMENT AND PLAN / ED COURSE  Pertinent labs & imaging results that were available during my care of the patient were reviewed by me and considered in my medical decision making (see chart for details).  Patient with known COVID-19 presents with worsening shortness of breath hypoxia.  She is requiring 4 L nasal cannula in the emergency department to keep her saturations above 90%.  We have treated with IV Decadron.  Chest x-ray is consistent with multifocal pneumonia, she will require admission for further management.  Have discussed with the hospitalist service    ____________________________________________   FINAL CLINICAL IMPRESSION(S) / ED DIAGNOSES  Final diagnoses:  Acute hypoxemic respiratory failure due to COVID-19 [facility])        Note:  This document was prepared using Dragon voice recognition software and may include unintentional dictation errors.   Jene Every, MD 05/05/19 [phone]

## 2019-05-05 NOTE — ED Notes (Signed)
Second lactate drawn and sent.

## 2019-05-05 NOTE — ED Triage Notes (Signed)
Sent from dr Sabra Heck. + covid, bilateral PNA. Put on oxygen for by PCP Monday, sats in low 80s at office on 3 L [place]. Received 80 mg kenalog there

## 2019-05-05 NOTE — Progress Notes (Signed)
Remdesivir - Pharmacy Brief Note   O:  ALT: 27 CXR: PNA SpO2: 80's% on 3L [place]   A/P:  Remdesivir 200 mg IVPB once followed by 100 mg IVPB daily x 4 days.   Paulina Fusi, PharmD, BCPS 05/05/2019 2:14 PM

## 2019-05-05 NOTE — ED Notes (Signed)
Pt returned to bed after ambulating from restroom, O2 saturation showing 60% on 6L [place], Dr. Alfred Levins informed and is at bedside, pt placed on 15L [place]

## 2019-05-05 NOTE — ED Notes (Signed)
Respiratory contacted to place pt on high flow O2

## 2019-05-06 ENCOUNTER — Other Ambulatory Visit: Payer: Self-pay

## 2019-05-06 DIAGNOSIS — J9600 Acute respiratory failure, unspecified whether with hypoxia or hypercapnia: Secondary | ICD-10-CM | POA: Diagnosis present

## 2019-05-06 LAB — CBC WITH DIFFERENTIAL/PLATELET
Abs Immature Granulocytes: 0.08 10*3/uL — ABNORMAL HIGH (ref 0.00–0.07)
Basophils Absolute: 0 10*3/uL (ref 0.0–0.1)
Basophils Relative: 0 %
Eosinophils Absolute: 0 10*3/uL (ref 0.0–0.5)
Eosinophils Relative: 0 %
HCT: 38.3 % (ref 36.0–46.0)
Hemoglobin: 12.1 g/dL (ref 12.0–15.0)
Immature Granulocytes: 1 %
Lymphocytes Relative: 6 %
Lymphs Abs: 0.6 10*3/uL — ABNORMAL LOW (ref 0.7–4.0)
MCH: 25.3 pg — ABNORMAL LOW (ref 26.0–34.0)
MCHC: 31.6 g/dL (ref 30.0–36.0)
MCV: 80 fL (ref 80.0–100.0)
Monocytes Absolute: 0.3 10*3/uL (ref 0.1–1.0)
Monocytes Relative: 2 %
Neutro Abs: 9.6 10*3/uL — ABNORMAL HIGH (ref 1.7–7.7)
Neutrophils Relative %: 91 %
Platelets: 236 10*3/uL (ref 150–400)
RBC: 4.79 MIL/uL (ref 3.87–5.11)
RDW: 15.1 % (ref 11.5–15.5)
WBC: 10.6 10*3/uL — ABNORMAL HIGH (ref 4.0–10.5)
nRBC: 0 % (ref 0.0–0.2)

## 2019-05-06 LAB — HIV ANTIBODY (ROUTINE TESTING W REFLEX): HIV Screen 4th Generation wRfx: NONREACTIVE

## 2019-05-06 LAB — COMPREHENSIVE METABOLIC PANEL
ALT: 31 U/L (ref 0–44)
AST: 54 U/L — ABNORMAL HIGH (ref 15–41)
Albumin: 2.9 g/dL — ABNORMAL LOW (ref 3.5–5.0)
Alkaline Phosphatase: 69 U/L (ref 38–126)
Anion gap: 9 (ref 5–15)
BUN: 24 mg/dL — ABNORMAL HIGH (ref 8–23)
CO2: 28 mmol/L (ref 22–32)
Calcium: 8.4 mg/dL — ABNORMAL LOW (ref 8.9–10.3)
Chloride: 102 mmol/L (ref 98–111)
Creatinine, Ser: 0.71 mg/dL (ref 0.44–1.00)
GFR calc Af Amer: >60 mL/min (ref >60)
GFR calc non Af Amer: >60 mL/min (ref >60)
Glucose, Bld: 168 mg/dL — ABNORMAL HIGH (ref 70–99)
Potassium: 3.8 mmol/L (ref 3.5–5.1)
Sodium: 139 mmol/L (ref 135–145)
Total Bilirubin: 0.6 mg/dL (ref 0.3–1.2)
Total Protein: 6.4 g/dL — ABNORMAL LOW (ref 6.5–8.1)

## 2019-05-06 LAB — BRAIN NATRIURETIC PEPTIDE: B Natriuretic Peptide: 74 pg/mL (ref 0.0–100.0)

## 2019-05-06 LAB — LACTIC ACID, PLASMA
Lactic Acid, Venous: 1.7 mmol/L (ref 0.5–1.9)
Lactic Acid, Venous: 1.7 mmol/L (ref 0.5–1.9)

## 2019-05-06 LAB — BLOOD GAS, ARTERIAL
Acid-Base Excess: 6.1 mmol/L — ABNORMAL HIGH (ref 0.0–2.0)
Bicarbonate: 29.6 mmol/L — ABNORMAL HIGH (ref 20.0–28.0)
Delivery systems: POSITIVE
FIO2: 0.9
O2 Saturation: 93.1 %
PEEP: 7 cmH2O
Patient temperature: 37
Pressure support: 12 cmH2O
RATE: 8 resp/min
pCO2 arterial: 38 mmHg (ref 32.0–48.0)
pH, Arterial: 7.5 — ABNORMAL HIGH (ref 7.350–7.450)
pO2, Arterial: 61 mmHg — ABNORMAL LOW (ref 83.0–108.0)

## 2019-05-06 LAB — FIBRIN DERIVATIVES D-DIMER (ARMC ONLY): Fibrin derivatives D-dimer (ARMC): 3163.77 ng/mL (FEU) — ABNORMAL HIGH (ref 0.00–499.00)

## 2019-05-06 LAB — MAGNESIUM: Magnesium: 1.9 mg/dL (ref 1.7–2.4)

## 2019-05-06 LAB — C-REACTIVE PROTEIN: CRP: 16.3 mg/dL — ABNORMAL HIGH (ref <1.0)

## 2019-05-06 LAB — TROPONIN I (HIGH SENSITIVITY): Troponin I (High Sensitivity): 13 ng/L (ref <18)

## 2019-05-06 LAB — GLUCOSE, CAPILLARY
Glucose-Capillary: 208 mg/dL — ABNORMAL HIGH (ref 70–99)
Glucose-Capillary: 236 mg/dL — ABNORMAL HIGH (ref 70–99)

## 2019-05-06 LAB — MRSA PCR SCREENING: MRSA by PCR: NEGATIVE

## 2019-05-06 LAB — FERRITIN: Ferritin: 191 ng/mL (ref 11–307)

## 2019-05-06 LAB — HEPATITIS B SURFACE ANTIGEN: Hepatitis B Surface Ag: NONREACTIVE

## 2019-05-06 MED ORDER — ORAL CARE MOUTH RINSE
15.0000 mL | Freq: Two times a day (BID) | OROMUCOSAL | Status: DC
  Administered 2019-05-07 – 2019-05-13 (×10): 15 mL via OROMUCOSAL

## 2019-05-06 MED ORDER — MELATONIN 5 MG PO TABS
5.0000 mg | ORAL_TABLET | Freq: Every day | ORAL | Status: DC
  Administered 2019-05-06 – 2019-05-08 (×3): 5 mg via ORAL
  Filled 2019-05-06 (×5): qty 1

## 2019-05-06 MED ORDER — DOCUSATE SODIUM 100 MG PO CAPS
100.0000 mg | ORAL_CAPSULE | Freq: Every day | ORAL | Status: DC | PRN
  Administered 2019-05-10: 04:00:00 100 mg via ORAL
  Filled 2019-05-06: qty 1

## 2019-05-06 MED ORDER — ONE-DAILY MULTI VITAMINS PO TABS: 1.0000 | ORAL_TABLET | Freq: Every day | ORAL | Status: DC

## 2019-05-06 MED ORDER — CHOLESTYRAMINE LIGHT 4 G PO PACK
4.0000 g | PACK | Freq: Every day | ORAL | Status: DC | PRN
  Filled 2019-05-06: qty 1

## 2019-05-06 MED ORDER — PANTOPRAZOLE SODIUM 40 MG PO TBEC: 40.0000 mg | DELAYED_RELEASE_TABLET | Freq: Every day | ORAL | Status: DC

## 2019-05-06 MED ORDER — FUROSEMIDE 10 MG/ML IJ SOLN
40.0000 mg | Freq: Once | INTRAMUSCULAR | Status: AC
  Administered 2019-05-06: 08:00:00 40 mg via INTRAVENOUS
  Filled 2019-05-06: qty 4

## 2019-05-06 MED ORDER — LEVALBUTEROL HCL 1.25 MG/0.5ML IN NEBU: 1.2500 mg | INHALATION_SOLUTION | Freq: Four times a day (QID) | RESPIRATORY_TRACT | Status: DC | PRN

## 2019-05-06 MED ORDER — SALINE SPRAY 0.65 % NA SOLN
1.0000 | NASAL | Status: DC | PRN
  Filled 2019-05-06 (×2): qty 44

## 2019-05-06 MED ORDER — CALCIUM CARBONATE-VITAMIN D 500-200 MG-UNIT PO TABS
1.0000 | ORAL_TABLET | Freq: Every day | ORAL | Status: DC
  Administered 2019-05-08 – 2019-05-09 (×2): 1 via ORAL
  Filled 2019-05-06 (×3): qty 1

## 2019-05-06 MED ORDER — DEXAMETHASONE SODIUM PHOSPHATE 10 MG/ML IJ SOLN
6.0000 mg | INTRAMUSCULAR | Status: DC
  Administered 2019-05-07: 6 mg via INTRAVENOUS
  Filled 2019-05-06: qty 0.6

## 2019-05-06 MED ORDER — POLYVINYL ALCOHOL 1.4 % OP SOLN
1.0000 [drp] | Freq: Three times a day (TID) | OPHTHALMIC | Status: DC | PRN
  Filled 2019-05-06: qty 15

## 2019-05-06 MED ORDER — PRAMIPEXOLE DIHYDROCHLORIDE 0.125 MG PO TABS
0.1250 mg | ORAL_TABLET | Freq: Every day | ORAL | Status: DC
  Administered 2019-05-07 – 2019-05-12 (×5): 0.125 mg via ORAL
  Filled 2019-05-06: qty 1
  Filled 2019-05-06 (×2): qty 0.5
  Filled 2019-05-06: qty 1
  Filled 2019-05-06 (×3): qty 0.5
  Filled 2019-05-06: qty 1
  Filled 2019-05-06: qty 0.5

## 2019-05-06 MED ORDER — ENOXAPARIN SODIUM 40 MG/0.4ML ~~LOC~~ SOLN
40.0000 mg | Freq: Two times a day (BID) | SUBCUTANEOUS | Status: DC
  Administered 2019-05-06 – 2019-05-07 (×2): 40 mg via SUBCUTANEOUS
  Filled 2019-05-06: qty 0.4

## 2019-05-06 MED ORDER — CHLORHEXIDINE GLUCONATE CLOTH 2 % EX PADS
6.0000 | MEDICATED_PAD | Freq: Every day | CUTANEOUS | Status: DC
  Administered 2019-05-06 – 2019-05-13 (×8): 6 via TOPICAL

## 2019-05-06 MED ORDER — ALPRAZOLAM 0.25 MG PO TABS: 0.2500 mg | ORAL_TABLET | Freq: Every day | ORAL | Status: DC

## 2019-05-06 MED ORDER — CHLORHEXIDINE GLUCONATE 0.12 % MT SOLN
15.0000 mL | Freq: Two times a day (BID) | OROMUCOSAL | Status: DC
  Administered 2019-05-06 – 2019-05-13 (×14): 15 mL via OROMUCOSAL
  Filled 2019-05-06 (×11): qty 15

## 2019-05-06 MED ORDER — CALCIUM CARBONATE ANTACID 500 MG PO CHEW
1.0000 | CHEWABLE_TABLET | Freq: Two times a day (BID) | ORAL | Status: DC | PRN
  Administered 2019-05-08: 11:00:00 400 mg via ORAL
  Filled 2019-05-06: qty 2

## 2019-05-06 MED ORDER — IPRATROPIUM BROMIDE 0.02 % IN SOLN
0.5000 mg | RESPIRATORY_TRACT | Status: DC
  Administered 2019-05-06: 08:00:00 0.5 mg via RESPIRATORY_TRACT
  Filled 2019-05-06: qty 2.5

## 2019-05-06 MED ORDER — LORATADINE 10 MG PO TABS
10.0000 mg | ORAL_TABLET | Freq: Every day | ORAL | Status: DC
  Administered 2019-05-08 – 2019-05-09 (×2): 10 mg via ORAL
  Filled 2019-05-06 (×2): qty 1

## 2019-05-06 MED ORDER — HYDROXYZINE HCL 10 MG PO TABS
10.0000 mg | ORAL_TABLET | Freq: Three times a day (TID) | ORAL | Status: DC | PRN
  Filled 2019-05-06: qty 1

## 2019-05-06 MED ORDER — GABAPENTIN 300 MG PO CAPS
300.0000 mg | ORAL_CAPSULE | Freq: Two times a day (BID) | ORAL | Status: DC
  Administered 2019-05-06 – 2019-05-13 (×12): 300 mg via ORAL
  Filled 2019-05-06 (×13): qty 1

## 2019-05-06 MED ORDER — ADULT MULTIVITAMIN W/MINERALS CH: 1.0000 | ORAL_TABLET | Freq: Every day | ORAL | Status: DC

## 2019-05-06 MED ORDER — FUROSEMIDE 10 MG/ML IJ SOLN
80.0000 mg | Freq: Once | INTRAMUSCULAR | Status: AC
  Administered 2019-05-06: 20:00:00 80 mg via INTRAVENOUS
  Filled 2019-05-06: qty 8

## 2019-05-06 MED ORDER — IPRATROPIUM BROMIDE 0.02 % IN SOLN: 0.5000 mg | RESPIRATORY_TRACT | Status: DC | PRN

## 2019-05-06 MED ORDER — TRAMADOL HCL 50 MG PO TABS
50.0000 mg | ORAL_TABLET | ORAL | Status: DC | PRN
  Administered 2019-05-08 – 2019-05-10 (×3): 50 mg via ORAL
  Filled 2019-05-06 (×3): qty 1

## 2019-05-06 MED ORDER — L-LYSINE 500 MG PO CAPS: 500.0000 mg | ORAL_CAPSULE | Freq: Every day | ORAL | Status: DC

## 2019-05-06 MED ORDER — LIDOCAINE 5 % EX PTCH
1.0000 | MEDICATED_PATCH | Freq: Every day | CUTANEOUS | Status: DC
  Administered 2019-05-07 – 2019-05-13 (×7): 1 via TRANSDERMAL
  Filled 2019-05-06 (×8): qty 1

## 2019-05-06 NOTE — ED Notes (Signed)
Pt unable to tolerate taking bipap off long enough for PO med administration. This RN will notify MD.

## 2019-05-06 NOTE — Consult Note (Signed)
Name: Deborah Miller MRN: [id number] DOB: 1955-12-26    ADMISSION DATE:  05/05/2019 CONSULTATION DATE: 05/05/2018  REFERRING MD : Dr. Priscella Mann   CHIEF COMPLAINT: Shortness of Breath   BRIEF PATIENT DESCRIPTION:  64 yo female initially diagnosed with COVID-19 in the outpatient setting on 12/23 currently admitted with acute hypoxic respiratory failure secondary to COVID-19 pneumonia requiring HFNC and NRB  SIGNIFICANT EVENTS/STUDIES:  12/31: Pt presented to the ER from PCP with worsening acute hypoxic respiratory failure secondary to COVID-19 pneumonia 01/1: Pt admitted to ICU on [place].  Later transitioned off Bipap to HFNC and NRB  HISTORY OF PRESENT ILLNESS:  This is a 64 yo female with a PMH of OSA (wear CPAP qhs), Dysphagia, Morbid Obesity, Right Thyroid Nodule, HTN, Hemorrhoids, Migraine Headaches, Esophageal Spasms, Environmental Allergies, Borderline Diabetes, Depression, Cor Pulmonale, Asthma, Arthritis, Anxiety, and Acid Reflux.  She presented to [facility] ER on 12/31 from her PCP office with worsening acute hypoxic respiratory failure secondary to COVID-19.  She was initially diagnosed with COVID-19 on 12/23 by her PCP per outpatient provider she received a kenalog injection and instructed to take clarithromycin and prednisone.  She later developed worsening hypoxia prompting PCP office visit on 12/29 and pt prescribed home O2 @2L .  On 12/31 due to worsening acute hypoxic respiratory failure (pulse ox readings at home in the 70's) despite home O2 she saw her outpatient PCP and was instructed to proceed to the ER.  Upon arrival to the ER pts O2 sats on 4L were in the 90's. CXR concerning for multifocal pneumonia and cardiomegaly.  She received remdesivir.  She was initially admitted to the [facility] unit per hospitalist team, however due to worsening hypoxia pt admitted to the stepdown unit.  PCCM consulted for additional management.  PAST MEDICAL HISTORY :   has a past medical history of Acid  reflux, Allergic genetic state, Anxiety, Arthritis, Asthma (1957), Breast microcalcification, mammographic (2014), Breast screening, unspecified, Carpal tunnel syndrome (1999), Cor pulmonale ([place]), DDD (degenerative disc disease), lumbar, Depression, Diabetes mellitus without complication ([place]) (9678), Environmental allergies, Esophageal spasm, Fecal smearing, Headache, Heart murmur, Hemorrhoids (2012), History of hiatal hernia, Hypertension, Lump in thyroid (2019), Morbid obesity ([place]), Osteoarthritis of spine with radiculopathy, lumbar region, Other dysphagia, Pain, PONV (postoperative nausea and vomiting), Primary osteoarthritis of left knee, Primary osteoarthritis of right hip, Right thyroid nodule, Shortness of breath dyspnea, and Sleep apnea.  has a past surgical history that includes Nose surgery (2001); ganglion cyst removal  (Left, 1988); Dilation and curettage of uterus (1987); tonsilloadenoidectomy (2003); Cholecystectomy (2002); Colonoscopy (2009); carpal tunnel--left hand  (Left, 2012); Upper gi endoscopy (2009, 2015); left breast biopsy (Left, 2006); Bunionectomy (Left, 1996); Nasal septum surgery (1975); neck injection (Right, 2014); Total hip arthroplasty (Right, 02/14/2015); Knee surgery (Left, 2014); Colonoscopy with propofol (N/A, 08/20/2016); Back surgery (2010); Tubal ligation; Joint replacement (Right, 2016); Tonsillectomy (1991); Knee Arthroplasty (Right, 10/28/2017); Anterior fusion cervical spine; Mouth lesion excisional biopsy; Esophagogastroduodenoscopy (egd) with propofol (N/A, 04/30/2018); Breast excisional biopsy (Left, 2014); and Breast biopsy (Left). Prior to Admission medications   Medication Sig Start Date End Date Taking? Authorizing Provider  acetaminophen (TYLENOL) 500 MG tablet Take 650 mg by mouth every 6 (six) hours as needed for moderate pain or headache. If no relief of pain within one hour, patient takes a second one.   Yes [provider]  ADVAIR HFA 115-21  MCG/ACT inhaler Inhale 2 puffs into the lungs 2 (two) times daily.  10/10/12  Yes [provider]  albuterol (PROVENTIL HFA;VENTOLIN HFA) 108 (90 BASE) MCG/ACT inhaler Inhale 2 puffs into the lungs every 6 (six) hours as needed for wheezing or shortness of breath.    Yes [provider]  ALPRAZolam (XANAX) 0.25 MG tablet Take 1 tablet (0.25 mg total) by mouth daily. 11/02/17  Yes Toni Arthurs, NP  b complex vitamins tablet Take 1 tablet by mouth daily.   Yes [provider]  calcium carbonate (TUMS - DOSED IN MG ELEMENTAL CALCIUM) 500 MG chewable tablet Chew 1-3 tablets by mouth 2 (two) times daily as needed for indigestion or heartburn.    Yes [provider]  Calcium Carbonate-Vitamin D (CALCIUM 600 + D PO) Take 1 tablet by mouth daily.   Yes [provider]  cetirizine (ZYRTEC) 10 MG tablet Take 10 mg by mouth daily.   Yes [provider]  Chlorpheniramine-DM (CORICIDIN COUGH/COLD) 4-30 MG TABS Take 1 tablet by mouth 2 (two) times daily as needed (cough / cold symptoms).   Yes [provider]  cholestyramine light (PREVALITE) 4 GM/DOSE powder Take 4 g by mouth daily as needed (GI distress).    Yes [provider]  clarithromycin (BIAXIN) 500 MG tablet Take 500 mg by mouth 2 (two) times daily. 04/27/19  Yes [provider]  docusate sodium (COLACE) 100 MG capsule Take 100 mg by mouth daily.    Yes [provider]  gabapentin (NEURONTIN) 300 MG capsule Take 300 mg by mouth 2 (two) times daily.  10/10/12  Yes [provider]  L-Lysine 500 MG CAPS Take 500 mg by mouth daily.    Yes [provider]  lidocaine (LIDODERM) 5 % Place 1 patch onto the skin daily. 04/15/19  Yes [provider]  Melatonin 5 MG TABS Take 5 mg by mouth at bedtime.    Yes [provider]  meloxicam (MOBIC) 15 MG tablet Take 7.5 mg by mouth 2 (two) times daily.    Yes [provider]  Multiple Vitamin  (MULTIVITAMIN) tablet Take 1 tablet by mouth daily.   Yes [provider]  omeprazole (PRILOSEC) 20 MG capsule Take 20 mg by mouth daily.    Yes [provider]  polyvinyl alcohol (LIQUIFILM TEARS) 1.4 % ophthalmic solution Place 1 drop into both eyes 3 (three) times daily as needed for dry eyes.    Yes [provider]  potassium chloride (K-DUR) 10 MEQ tablet Take 10 mEq by mouth daily.    Yes [provider]  pramipexole (MIRAPEX) 0.125 MG tablet Take 0.125 mg by mouth at bedtime.    Yes [provider]  predniSONE (DELTASONE) 20 MG tablet Take 20 mg by mouth 2 (two) times daily. 05/03/19  Yes [provider]  sodium chloride (OCEAN) 0.65 % SOLN nasal spray Place 1 spray into both nostrils every 2 (two) hours as needed for congestion.    Yes [provider]  SPIRIVA HANDIHALER 18 MCG inhalation capsule Place 1 capsule into inhaler and inhale daily. 10/22/12  Yes [provider]  torsemide (DEMADEX) 20 MG tablet Take 20 mg by mouth daily.  10/22/12  Yes [provider]  traMADol (ULTRAM) 50 MG tablet Take 1-2 tablets (50-100 mg total) by mouth every 4 (four) hours as needed for moderate pain. 11/09/17  Yes Gerlene Fee, NP  EPINEPHrine (AUVI-Q) 0.3 mg/0.3 mL IJ SOAJ injection Inject 0.3 mg into the muscle daily as needed.     [provider]   Allergies  Allergen Reactions  .  Citrus     Itchy throat   . Pork-Derived Products     Itchy throat   . Augmentin [Amoxicillin-Pot Clavulanate] Nausea And Vomiting    Has patient had a PCN reaction causing immediate rash, facial/tongue/throat swelling, SOB or lightheadedness with hypotension: No Has patient had a PCN reaction causing severe rash involving mucus membranes or skin necrosis: No Has patient had a PCN reaction that required hospitalization: No Has patient had a PCN reaction occurring within the last 10 years: Yes If all of the above answers are "NO",  then may proceed with Cephalosporin use.   . Latex Other (See Comments)    Blisters NOTE: Latex IgE NEGATIVE (<0.10) Around mouth causes a rash  . Morphine And Related Nausea And Vomiting    Patient does not want morphine at all.  . Cefadroxil Other (See Comments)    Severe cramping in side. Felt like ribs were going to explode  . Other Other (See Comments)    Lettuce causes her to start with diverticulitis  . Pollen Extract Other (See Comments)    Trees, grass, mold, dust causes raspy voice, nasal irritation  . Tape Other (See Comments)    Blisters. Paper tape is acceptable    FAMILY HISTORY:  family history includes Diabetes in her father; Heart disease in her father; Ovarian cancer in her maternal grandmother. SOCIAL HISTORY:  reports that she quit smoking about 32 years ago. She has a 10.00 pack-year smoking history. She has never used smokeless tobacco. She reports current alcohol use. She reports that she does not use drugs.  REVIEW OF SYSTEMS: Positives in BOLD  Constitutional: fever, chills, weight loss, malaise/fatigue and diaphoresis.  HENT: Negative for hearing loss, ear pain, nosebleeds, congestion, sore throat, neck pain, tinnitus and ear discharge.   Eyes: Negative for blurred vision, double vision, photophobia, pain, discharge and redness.  Respiratory: cough, hemoptysis, sputum production, shortness of breath, wheezing and stridor.   Cardiovascular: Negative for chest pain, palpitations, orthopnea, claudication, leg swelling and PND.  Gastrointestinal: heartburn, nausea, vomiting, abdominal pain, diarrhea, constipation, blood in stool and melena.  Genitourinary: Negative for dysuria, urgency, frequency, hematuria and flank pain.  Musculoskeletal: Negative for myalgias, back pain, joint pain and falls.  Skin: Negative for itching and rash.  Neurological: Negative for dizziness, tingling, tremors, sensory change, speech change, focal weakness, seizures, loss of  consciousness, weakness and headaches.  Endo/Heme/Allergies: Negative for environmental allergies and polydipsia. Does not bruise/bleed easily.  SUBJECTIVE:  Pt stating she is thirsty, no complaints at this time   VITAL SIGNS: Temp:  [98 F (36.7 C)-99.2 F (37.3 C)] 98 F (36.7 C) (01/01 2000) Pulse Rate:  [84-111] 104 (01/01 2000) Resp:  [16-36] 31 (01/01 2000) BP: (125-174)/(41-101) 126/70 (01/01 2000) SpO2:  [81 %-100 %] 91 % (01/01 2000) FiO2 (%):  [100 %] 100 % (01/01 1945) Weight:  [89.1 kg] 89.1 kg (01/01 1735)  PHYSICAL EXAMINATION: General: acutely ill appearing female, resting in bed, NAD  Neuro: alert and oriented, follows commands HEENT: supple, no JVD Cardiovascular: sinus tach, no R/G  Lungs: faint crackles throughout, even, non labored  Abdomen: +BS x4, obese, soft, non tender, non distended  Musculoskeletal: normal bulk and tone, no edema  Skin: intact no rashes or lesions present   Recent Labs  Lab 05/05/19 1248 05/06/19 0505  NA 134* 139  K 3.3* 3.8  CL 95* 102  CO2 26 28  BUN 27* 24*  CREATININE 0.95 0.71  GLUCOSE 259* 168*   Recent  Labs  Lab 05/05/19 1248 05/06/19 0505  HGB 12.1 12.1  HCT 38.3 38.3  WBC 11.4* 10.6*  PLT 264 236   DG Chest Port 1 View  Result Date: 05/05/2019 CLINICAL DATA:  Sent from dr Hyacinth Meeker. + covid, bilateral PNA. Put on oxygen for by PCP Monday, sats in low 80s at office on 3 L [place]. Received 80 mg kenalog there EXAM: PORTABLE CHEST 1 VIEW COMPARISON:  Chest radiograph 03/03/2014 FINDINGS: Stable cardiomediastinal contours with enlarged heart size. There are new diffuse bilateral patchy airspace opacities. No pneumothorax or large pleural effusion. No acute finding in the visualized skeleton. IMPRESSION: 1. New diffuse bilateral patchy airspace opacities most likely representing multifocal pneumonia. 2. Cardiomegaly. Electronically Signed   By: Emmaline Kluver M.D.   On: 05/05/2019 12:41    ASSESSMENT / PLAN:  Acute  hypoxic respiratory failure secondary to COVID-19 pneumonia  Hx: Asthma, Cor Pulmonale, OSA, and Morbid Obesity  Supplemental O2 to maintain O2 sats 88% or above  Continue remdesivir, decadron, and vitamins  Prn bronchodilator therapy and cough suppressant  Trend inflammatory markers  Trend WBC and monitor fever curve Follow cultures  OOB to chair as tolerated  Pulmonary hygiene Continue proning as tolerated due to severe hypoxia   Maintain airborne and contact precautions  Continuous telemetry monitoring   Borderline diabetes mellitus  CBG ac/hs SSI   Anxiety Continue outpatient xanax   Best Practice: VTE px: subq lovenox SUP px: po protonix  Diet: heart healthy   Sonda Rumble, AGNP  Pulmonary/Critical Care Pager [phone] (please enter 7 digits) PCCM Consult Pager [phone] (please enter 7 digits)

## 2019-05-06 NOTE — ED Notes (Signed)
RT in room at this time to place BIPAP

## 2019-05-06 NOTE — Progress Notes (Addendum)
Nurse reports patient with sudden desaturation after ambulating to bathroom.  Both 15 L HFNC and NRB required to recover and maintain oxygen saturation, Sig=nificant change from being previously stable of 6 L HFNC> Review of chart and noted significant higher fibrin derivitives but also patient may have degree oif heart failure as she does now have cardiomegaly on CXR not previously noted on Xray  Plan Check BNP - give lasix if elevated Start BIPAP therapy Once stable enough, obtain CTA r/o PE  bnp 74.0, no lasix given, Oxygen saturation greatly improved with BIPAP therapy. CTA chest not yet ordered secondary to ? Tolerance off BIPAP for procedure.

## 2019-05-06 NOTE — Progress Notes (Signed)
PROGRESS NOTE    Deborah Miller  JJH:[id number] DOB: 07-25-1955 DOA: 05/05/2019 PCP: Rusty Aus, MD   Brief Narrative: Deborah Miller is a 64 y.o. female with medical history significant of hypertension, diabetes mellitus, asthma, GERD, depression with anxiety, OSA, cor pulmonale, who presents with shortness of breath.  Patient states that she has been having cough and shortness of breath for nearly a week, she had a positive COVID-19 test. Her PCP started her on home oxygen on [place] and treated her with 80 mg of Kenalog. She states that she continues to have shortness of breath, which has been progressively worsening.  She has had some chest discomfort and congestion.  Patient reports nausea vomiting few days ago, which has resolved.  Currently no nausea, vomiting, diarrhea or abdominal pain.  No symptoms of UTI or unilateral weakness.She reports her home pulse oximetry was in the 70-80s.   Currently no fever or chills.  1/1: Patient with significant respiratory decompensation over interval.  On high flow nasal cannula, titrated up to 55 L.  Patient did not tolerate.  Was placed on BiPAP.  Tolerating BiPAP adequately.  Mentating clearly.  Mildly increased work of breathing.   Assessment & Plan:   Principal Problem:   Acute respiratory disease due to COVID-19 virus Active Problems:   Esophageal reflux   Asthma   HTN (hypertension)   Depression with anxiety   Lactic acidosis   Hypokalemia   Acute respiratory failure due to COVID-19 ([place])  COVID-19 pneumonia Acute respiratory failure with hypoxia secondary to above Worsening oxygen saturation since admission Chest x-ray positive for bilateral infiltrate consistent with Covid infection Patient cannot tolerate transition from BiPAP to high flow nasal cannula Currently holding in the ED awaiting stepdown versus ICU bed  Plan: - Patient was admitted to [place] bed however acuity is too high - I have consulted ICU for  evaluation, discussed with Dr. Lanney Gins, pending formal recommendations, assistance appreciated - Currently requested stepdown unit bed however pending ICU evaluation may be more appropriate for ICU level of care -Continue noninvasive positive pressure ventilation for now though not recommended for treatment of Covid related respiratory failure -Titrate to high flow nasal cannula when able -Continue remdesivir per  pharmacy dosing regimen (day 2 of 5) -Switch steroids to dexamethasone 6 mg IV daily, plan for 10-day course (day 2 of 10) -vitamin C, zinc.   -Trial of Lasix 40 mg IV x1 -Bronchodilators -Maintain euvolemia, avoid IV fluids if possible -Prone positioning as tolerated -Should patient decompensate further, she is amenable to endotracheal intubation - Attempt CT thorax once patient more stable  Esophageal reflux: -PPI  Asthma: -Bronchodilators  HTN:  --hold torsemid -hydralazine prn  Depression with anxiety: No SI or HI -continue home pramipexole, Xanax  Lactic acidosis: lactic acid 3.4 -on gentle IVF: NS at 75 cc/h  Stage III obesity, BMI 47.4 Counseled patient Nutrition consult  DVT prophylaxis: Lovenox  Code Status: Full Family Communication: None  disposition Plan: Pending clinical course  Consultants:   ICU- [place]  Procedures:   BiPAP  Antimicrobials:   Remdesivir (05/05/19-  )   Subjective: Seen and examined On BiPAP currently 12/7, 90% Mentating clearly Mildly increased work of breathing  Objective: Vitals:   05/06/19 1245 05/06/19 1300 05/06/19 1400 05/06/19 1415  BP:    125/74  Pulse: (!) 108 100 88 86  Resp: (!) 36 (!) 28 (!) 23 (!) 30  Temp:      TempSrc:      SpO2:  93% 96% 96% 94%  Weight:      Height:        Intake/Output Summary (Last 24 hours) at 05/06/2019 1516 Last data filed at 05/05/2019 1625 Gross per 24 hour  Intake 250 ml  Output --  Net 250 ml   Filed Weights   05/05/19 1153  Weight: (S) 92.5 kg     Examination:  General exam: Well-developed, well-nourished Respiratory system: Scattered rhonchi bilaterally. Mild increased work of breathing Cardiovascular system: S1 & S2 heard, RRR. No JVD, murmurs, rubs, gallops or clicks.  Trace pedal edema Gastrointestinal system: Obese, nontender nondistended, positive bowel sounds Central nervous system: Alert and oriented. No focal neurological deficits. Extremities: Symmetric 5 x 5 power. Skin: No rashes, lesions or ulcers Psychiatry: Judgement and insight appear normal. Mood & affect appropriate.     Data Reviewed: I have personally reviewed following labs and imaging studies  CBC: Recent Labs  Lab 05/05/19 1248 05/06/19 0505  WBC 11.4* 10.6*  NEUTROABS 10.3* 9.6*  HGB 12.1 12.1  HCT 38.3 38.3  MCV 79.1* 80.0  PLT 264 236   Basic Metabolic Panel: Recent Labs  Lab 05/05/19 1248 05/06/19 0505  NA 134* 139  K 3.3* 3.8  CL 95* 102  CO2 26 28  GLUCOSE 259* 168*  BUN 27* 24*  CREATININE 0.95 0.71  CALCIUM 8.6* 8.4*  MG  --  1.9   GFR: Estimated Creatinine Clearance: 65.2 mL/min (by C-G formula based on SCr of 0.71 mg/dL). Liver Function Tests: Recent Labs  Lab 05/05/19 1248 05/06/19 0505  AST 56* 54*  ALT 27 31  ALKPHOS 72 69  BILITOT 0.9 0.6  PROT 6.9 6.4*  ALBUMIN 3.2* 2.9*   No results for input(s): LIPASE, AMYLASE in the last 168 hours. No results for input(s): AMMONIA in the last 168 hours. Coagulation Profile: No results for input(s): INR, PROTIME in the last 168 hours. Cardiac Enzymes: No results for input(s): CKTOTAL, CKMB, CKMBINDEX, TROPONINI in the last 168 hours. BNP (last 3 results) No results for input(s): PROBNP in the last 8760 hours. HbA1C: Recent Labs    05/05/19 1248  HGBA1C 7.6*   CBG: Recent Labs  Lab 05/05/19 1814 05/05/19 2136  GLUCAP 375* 259*   Lipid Profile: Recent Labs    05/05/19 1231  TRIG 230*   Thyroid Function Tests: No results for input(s): TSH, T4TOTAL,  FREET4, T3FREE, THYROIDAB in the last 72 hours. Anemia Panel: Recent Labs    05/05/19 1248 05/06/19 0505  FERRITIN 206 191   Sepsis Labs: Recent Labs  Lab 05/05/19 1231 05/05/19 1248 05/06/19 1022  PROCALCITON  --  <0.10  --   LATICACIDVEN 3.4* 5.6* 1.7    Recent Results (from the past 240 hour(s))  Blood Culture (routine x 2)     Status: None (Preliminary result)   Collection Time: 05/05/19 12:31 PM   Specimen: BLOOD RIGHT HAND  Result Value Ref Range Status   Specimen Description BLOOD RIGHT HAND  Final   Special Requests   Final    BOTTLES DRAWN AEROBIC AND ANAEROBIC Blood Culture adequate volume   Culture   Final    NO GROWTH < 24 HOURS Performed at [facility], [address] [place], [place] [address]    Report Status PENDING  Incomplete  Blood Culture (routine x 2)     Status: None (Preliminary result)   Collection Time: 05/05/19 12:31 PM   Specimen: BLOOD RIGHT ARM  Result Value Ref Range Status   Specimen  Description BLOOD RIGHT ARM  Final   Special Requests   Final    BOTTLES DRAWN AEROBIC AND ANAEROBIC Blood Culture adequate volume   Culture   Final    NO GROWTH < 24 HOURS Performed at [facility], [address] [place], [place] [address]    Report Status PENDING  Incomplete         Radiology Studies: DG Chest Port 1 View  Result Date: 05/05/2019 CLINICAL DATA:  Sent from dr Hyacinth Meeker. + covid, bilateral PNA. Put on oxygen for by PCP Monday, sats in low 80s at office on 3 L [place]. Received 80 mg kenalog there EXAM: PORTABLE CHEST 1 VIEW COMPARISON:  Chest radiograph 03/03/2014 FINDINGS: Stable cardiomediastinal contours with enlarged heart size. There are new diffuse bilateral patchy airspace opacities. No pneumothorax or large pleural effusion. No acute finding in the visualized skeleton. IMPRESSION: 1. New diffuse bilateral patchy airspace opacities most likely representing multifocal pneumonia. 2. Cardiomegaly. Electronically  Signed   By: Emmaline Kluver M.D.   On: 05/05/2019 12:41        Scheduled Meds: . ALPRAZolam  0.25 mg Oral Daily  . calcium-vitamin D  1 tablet Oral Daily  . dextromethorphan-guaiFENesin  1 tablet Oral BID  . gabapentin  300 mg Oral BID  . insulin aspart  0-5 Units Subcutaneous QHS  . insulin aspart  0-9 Units Subcutaneous TID WC  . ipratropium  0.5 mg Nebulization Q4H  . lidocaine  1 patch Transdermal Daily  . loratadine  10 mg Oral Daily  . Melatonin  5 mg Oral QHS  . methylPREDNISolone (SOLU-MEDROL) injection  40 mg Intravenous Q12H  . multivitamin with minerals  1 tablet Oral Daily  . pantoprazole  40 mg Oral Daily  . pramipexole  0.125 mg Oral QHS   Continuous Infusions: . remdesivir 100 mg in NS 100 mL 100 mg (05/06/19 1457)     LOS: 1 day    Time spent: 35 minutes    Tresa Moore, MD [facility] Pager [phone]  If 7PM-7AM, please contact night-coverage [email or web address] Password [facility] 05/06/2019, 3:16 PM

## 2019-05-06 NOTE — ED Notes (Signed)
Bipap masked removed briefly for patient to take a drink of water, oxygen dropped to 70%.

## 2019-05-06 NOTE — ED Notes (Signed)
Pt had 1 episode of diarrhea. This RN and Les, EMT changed and cleaned pt. New bed sheets applied. Pt repositioned in bed. Respiratory at bedside at this time.

## 2019-05-06 NOTE — ED Notes (Signed)
Pt repositioned in bed at this time, purewick placed.  EKG leads replaced as well. VSS at this time.

## 2019-05-06 NOTE — ED Notes (Addendum)
Pt placed on bed pan at this time, pt offered to place purewick as well.  Pt wants to get up to go to the bathroom, informed her that because of the HFNC and her oxygen levels drop when ambulating.    Pt short of breath speaking in short sentences.

## 2019-05-06 NOTE — ED Notes (Signed)
This RN attempted to contact family earlier and phone call went straight to voice mail. Pt given personal cellphone and alert and oriented at this time. This RN informed pt to update family on status.

## 2019-05-06 NOTE — ED Notes (Signed)
Pt resting with eyes closed at this time, resps even and non labored, O2 saturation 97% at this time with NRB at 15L and high flow O2 in place

## 2019-05-06 NOTE — ED Notes (Signed)
Pt refusing finger stick blood glucose and insulin, MD aware. Lab at bedside to obtain lactic acid.

## 2019-05-06 NOTE — Progress Notes (Signed)
Patient remains on HFNC tolerating well. Checked with her to see if she wanted to change back over to bipap for the night since she wears a cpap for sleep at home. Patient states no. She is breathing ok on HFNC. Will continue to monitor

## 2019-05-06 NOTE — ED Notes (Signed)
Dr. Don Perking informed that pt continues to have low O2 saturation despite being on high flow O2, placed NRB at 15 L over high flow O2 per respiratory therapist advisement, O2 saturation now 94%

## 2019-05-06 NOTE — ED Notes (Signed)
This RN updated pt daughter christy with pt consent

## 2019-05-07 ENCOUNTER — Inpatient Hospital Stay: Payer: BC Managed Care – PPO

## 2019-05-07 ENCOUNTER — Inpatient Hospital Stay
Admission: AD | Admit: 2019-05-07 | Payer: BC Managed Care – PPO | Source: Other Acute Inpatient Hospital | Admitting: Internal Medicine

## 2019-05-07 LAB — CBC WITH DIFFERENTIAL/PLATELET
Abs Immature Granulocytes: 0.1 10*3/uL — ABNORMAL HIGH (ref 0.00–0.07)
Basophils Absolute: 0 10*3/uL (ref 0.0–0.1)
Basophils Relative: 0 %
Eosinophils Absolute: 0 10*3/uL (ref 0.0–0.5)
Eosinophils Relative: 0 %
HCT: 44.1 % (ref 36.0–46.0)
Hemoglobin: 13.7 g/dL (ref 12.0–15.0)
Immature Granulocytes: 1 %
Lymphocytes Relative: 7 %
Lymphs Abs: 1 10*3/uL (ref 0.7–4.0)
MCH: 24.9 pg — ABNORMAL LOW (ref 26.0–34.0)
MCHC: 31.1 g/dL (ref 30.0–36.0)
MCV: 80.2 fL (ref 80.0–100.0)
Monocytes Absolute: 0.5 10*3/uL (ref 0.1–1.0)
Monocytes Relative: 4 %
Neutro Abs: 13.1 10*3/uL — ABNORMAL HIGH (ref 1.7–7.7)
Neutrophils Relative %: 88 %
Platelets: 276 10*3/uL (ref 150–400)
RBC: 5.5 MIL/uL — ABNORMAL HIGH (ref 3.87–5.11)
RDW: 15.1 % (ref 11.5–15.5)
WBC: 14.8 10*3/uL — ABNORMAL HIGH (ref 4.0–10.5)
nRBC: 0 % (ref 0.0–0.2)

## 2019-05-07 LAB — COMPREHENSIVE METABOLIC PANEL
ALT: 33 U/L (ref 0–44)
AST: 52 U/L — ABNORMAL HIGH (ref 15–41)
Albumin: 3 g/dL — ABNORMAL LOW (ref 3.5–5.0)
Alkaline Phosphatase: 88 U/L (ref 38–126)
Anion gap: 12 (ref 5–15)
BUN: 31 mg/dL — ABNORMAL HIGH (ref 8–23)
CO2: 31 mmol/L (ref 22–32)
Calcium: 8.8 mg/dL — ABNORMAL LOW (ref 8.9–10.3)
Chloride: 99 mmol/L (ref 98–111)
Creatinine, Ser: 0.9 mg/dL (ref 0.44–1.00)
GFR calc Af Amer: >60 mL/min (ref >60)
GFR calc non Af Amer: >60 mL/min (ref >60)
Glucose, Bld: 251 mg/dL — ABNORMAL HIGH (ref 70–99)
Potassium: 3.8 mmol/L (ref 3.5–5.1)
Sodium: 142 mmol/L (ref 135–145)
Total Bilirubin: 0.8 mg/dL (ref 0.3–1.2)
Total Protein: 6.9 g/dL (ref 6.5–8.1)

## 2019-05-07 LAB — FIBRIN DERIVATIVES D-DIMER (ARMC ONLY): Fibrin derivatives D-dimer (ARMC): >7500 ng/mL (FEU) — ABNORMAL HIGH (ref 0.00–499.00)

## 2019-05-07 LAB — FERRITIN: Ferritin: 199 ng/mL (ref 11–307)

## 2019-05-07 LAB — GLUCOSE, CAPILLARY
Glucose-Capillary: 202 mg/dL — ABNORMAL HIGH (ref 70–99)
Glucose-Capillary: 175 mg/dL — ABNORMAL HIGH (ref 70–99)
Glucose-Capillary: 166 mg/dL — ABNORMAL HIGH (ref 70–99)
Glucose-Capillary: 311 mg/dL — ABNORMAL HIGH (ref 70–99)

## 2019-05-07 LAB — C-REACTIVE PROTEIN: CRP: 14.8 mg/dL — ABNORMAL HIGH (ref <1.0)

## 2019-05-07 LAB — MAGNESIUM: Magnesium: 2.3 mg/dL (ref 1.7–2.4)

## 2019-05-07 MED ORDER — MORPHINE SULFATE (PF) 2 MG/ML IV SOLN
2.0000 mg | INTRAVENOUS | Status: DC | PRN
  Administered 2019-05-07 – 2019-05-13 (×4): 2 mg via INTRAVENOUS
  Filled 2019-05-07 (×4): qty 1

## 2019-05-07 MED ORDER — METHOCARBAMOL 1000 MG/10ML IJ SOLN
500.0000 mg | Freq: Four times a day (QID) | INTRAVENOUS | Status: DC | PRN
  Filled 2019-05-07: qty 5

## 2019-05-07 MED ORDER — PANTOPRAZOLE SODIUM 40 MG IV SOLR
40.0000 mg | INTRAVENOUS | Status: DC
  Administered 2019-05-07 – 2019-05-09 (×3): 40 mg via INTRAVENOUS
  Filled 2019-05-07 (×3): qty 40

## 2019-05-07 MED ORDER — ENOXAPARIN SODIUM 100 MG/ML ~~LOC~~ SOLN
1.0000 mg/kg | Freq: Two times a day (BID) | SUBCUTANEOUS | Status: DC
  Administered 2019-05-07 – 2019-05-12 (×10): 90 mg via SUBCUTANEOUS
  Filled 2019-05-07 (×11): qty 1

## 2019-05-07 MED ORDER — FUROSEMIDE 10 MG/ML IJ SOLN: 80.0000 mg | Freq: Once | INTRAMUSCULAR | Status: DC

## 2019-05-07 MED ORDER — FUROSEMIDE 10 MG/ML IJ SOLN
80.0000 mg | Freq: Once | INTRAMUSCULAR | Status: AC
  Administered 2019-05-08: 06:00:00 80 mg via INTRAVENOUS
  Filled 2019-05-07: qty 8

## 2019-05-07 MED ORDER — HYDRALAZINE HCL 20 MG/ML IJ SOLN
10.0000 mg | Freq: Four times a day (QID) | INTRAMUSCULAR | Status: DC | PRN
  Administered 2019-05-10 – 2019-05-12 (×4): 10 mg via INTRAVENOUS
  Filled 2019-05-07 (×4): qty 1

## 2019-05-07 MED ORDER — LORAZEPAM 2 MG/ML IJ SOLN
1.0000 mg | INTRAMUSCULAR | Status: DC | PRN
  Administered 2019-05-07 – 2019-05-12 (×3): 1 mg via INTRAVENOUS
  Filled 2019-05-07 (×3): qty 1

## 2019-05-07 NOTE — Progress Notes (Signed)
Name: Deborah Miller MRN: [id number] DOB: 1955-12-26    ADMISSION DATE:  05/05/2019 CONSULTATION DATE: 05/05/2018  REFERRING MD : Dr. Priscella Mann   CHIEF COMPLAINT: Shortness of Breath   BRIEF PATIENT DESCRIPTION:  64 yo female initially diagnosed with COVID-19 in the outpatient setting on 12/23 currently admitted with acute hypoxic respiratory failure secondary to COVID-19 pneumonia requiring HFNC and NRB  SIGNIFICANT EVENTS/STUDIES:  12/31: Pt presented to the ER from PCP with worsening acute hypoxic respiratory failure secondary to COVID-19 pneumonia 01/1: Pt admitted to ICU on [place].  Later transitioned off Bipap to HFNC and NRB  HISTORY OF PRESENT ILLNESS:  This is a 64 yo female with a PMH of OSA (wear CPAP qhs), Dysphagia, Morbid Obesity, Right Thyroid Nodule, HTN, Hemorrhoids, Migraine Headaches, Esophageal Spasms, Environmental Allergies, Borderline Diabetes, Depression, Cor Pulmonale, Asthma, Arthritis, Anxiety, and Acid Reflux.  She presented to [facility] ER on 12/31 from her PCP office with worsening acute hypoxic respiratory failure secondary to COVID-19.  She was initially diagnosed with COVID-19 on 12/23 by her PCP per outpatient provider she received a kenalog injection and instructed to take clarithromycin and prednisone.  She later developed worsening hypoxia prompting PCP office visit on 12/29 and pt prescribed home O2 @2L .  On 12/31 due to worsening acute hypoxic respiratory failure (pulse ox readings at home in the 70's) despite home O2 she saw her outpatient PCP and was instructed to proceed to the ER.  Upon arrival to the ER pts O2 sats on 4L were in the 90's. CXR concerning for multifocal pneumonia and cardiomegaly.  She received remdesivir.  She was initially admitted to the [facility] unit per hospitalist team, however due to worsening hypoxia pt admitted to the stepdown unit.  PCCM consulted for additional management.  PAST MEDICAL HISTORY :   has a past medical history of Acid  reflux, Allergic genetic state, Anxiety, Arthritis, Asthma (1957), Breast microcalcification, mammographic (2014), Breast screening, unspecified, Carpal tunnel syndrome (1999), Cor pulmonale ([place]), DDD (degenerative disc disease), lumbar, Depression, Diabetes mellitus without complication ([place]) (9678), Environmental allergies, Esophageal spasm, Fecal smearing, Headache, Heart murmur, Hemorrhoids (2012), History of hiatal hernia, Hypertension, Lump in thyroid (2019), Morbid obesity ([place]), Osteoarthritis of spine with radiculopathy, lumbar region, Other dysphagia, Pain, PONV (postoperative nausea and vomiting), Primary osteoarthritis of left knee, Primary osteoarthritis of right hip, Right thyroid nodule, Shortness of breath dyspnea, and Sleep apnea.  has a past surgical history that includes Nose surgery (2001); ganglion cyst removal  (Left, 1988); Dilation and curettage of uterus (1987); tonsilloadenoidectomy (2003); Cholecystectomy (2002); Colonoscopy (2009); carpal tunnel--left hand  (Left, 2012); Upper gi endoscopy (2009, 2015); left breast biopsy (Left, 2006); Bunionectomy (Left, 1996); Nasal septum surgery (1975); neck injection (Right, 2014); Total hip arthroplasty (Right, 02/14/2015); Knee surgery (Left, 2014); Colonoscopy with propofol (N/A, 08/20/2016); Back surgery (2010); Tubal ligation; Joint replacement (Right, 2016); Tonsillectomy (1991); Knee Arthroplasty (Right, 10/28/2017); Anterior fusion cervical spine; Mouth lesion excisional biopsy; Esophagogastroduodenoscopy (egd) with propofol (N/A, 04/30/2018); Breast excisional biopsy (Left, 2014); and Breast biopsy (Left). Prior to Admission medications   Medication Sig Start Date End Date Taking? Authorizing Provider  acetaminophen (TYLENOL) 500 MG tablet Take 650 mg by mouth every 6 (six) hours as needed for moderate pain or headache. If no relief of pain within one hour, patient takes a second one.   Yes [provider]  ADVAIR HFA 115-21  MCG/ACT inhaler Inhale 2 puffs into the lungs 2 (two) times daily.  10/10/12  Yes [provider]  albuterol (PROVENTIL HFA;VENTOLIN HFA) 108 (90 BASE) MCG/ACT inhaler Inhale 2 puffs into the lungs every 6 (six) hours as needed for wheezing or shortness of breath.    Yes [provider]  ALPRAZolam (XANAX) 0.25 MG tablet Take 1 tablet (0.25 mg total) by mouth daily. 11/02/17  Yes Toni Arthurs, NP  b complex vitamins tablet Take 1 tablet by mouth daily.   Yes [provider]  calcium carbonate (TUMS - DOSED IN MG ELEMENTAL CALCIUM) 500 MG chewable tablet Chew 1-3 tablets by mouth 2 (two) times daily as needed for indigestion or heartburn.    Yes [provider]  Calcium Carbonate-Vitamin D (CALCIUM 600 + D PO) Take 1 tablet by mouth daily.   Yes [provider]  cetirizine (ZYRTEC) 10 MG tablet Take 10 mg by mouth daily.   Yes [provider]  Chlorpheniramine-DM (CORICIDIN COUGH/COLD) 4-30 MG TABS Take 1 tablet by mouth 2 (two) times daily as needed (cough / cold symptoms).   Yes [provider]  cholestyramine light (PREVALITE) 4 GM/DOSE powder Take 4 g by mouth daily as needed (GI distress).    Yes [provider]  clarithromycin (BIAXIN) 500 MG tablet Take 500 mg by mouth 2 (two) times daily. 04/27/19  Yes [provider]  docusate sodium (COLACE) 100 MG capsule Take 100 mg by mouth daily.    Yes [provider]  gabapentin (NEURONTIN) 300 MG capsule Take 300 mg by mouth 2 (two) times daily.  10/10/12  Yes [provider]  L-Lysine 500 MG CAPS Take 500 mg by mouth daily.    Yes [provider]  lidocaine (LIDODERM) 5 % Place 1 patch onto the skin daily. 04/15/19  Yes [provider]  Melatonin 5 MG TABS Take 5 mg by mouth at bedtime.    Yes [provider]  meloxicam (MOBIC) 15 MG tablet Take 7.5 mg by mouth 2 (two) times daily.    Yes [provider]  Multiple Vitamin  (MULTIVITAMIN) tablet Take 1 tablet by mouth daily.   Yes [provider]  omeprazole (PRILOSEC) 20 MG capsule Take 20 mg by mouth daily.    Yes [provider]  polyvinyl alcohol (LIQUIFILM TEARS) 1.4 % ophthalmic solution Place 1 drop into both eyes 3 (three) times daily as needed for dry eyes.    Yes [provider]  potassium chloride (K-DUR) 10 MEQ tablet Take 10 mEq by mouth daily.    Yes [provider]  pramipexole (MIRAPEX) 0.125 MG tablet Take 0.125 mg by mouth at bedtime.    Yes [provider]  predniSONE (DELTASONE) 20 MG tablet Take 20 mg by mouth 2 (two) times daily. 05/03/19  Yes [provider]  sodium chloride (OCEAN) 0.65 % SOLN nasal spray Place 1 spray into both nostrils every 2 (two) hours as needed for congestion.    Yes [provider]  SPIRIVA HANDIHALER 18 MCG inhalation capsule Place 1 capsule into inhaler and inhale daily. 10/22/12  Yes [provider]  torsemide (DEMADEX) 20 MG tablet Take 20 mg by mouth daily.  10/22/12  Yes [provider]  traMADol (ULTRAM) 50 MG tablet Take 1-2 tablets (50-100 mg total) by mouth every 4 (four) hours as needed for moderate pain. 11/09/17  Yes Gerlene Fee, NP  EPINEPHrine (AUVI-Q) 0.3 mg/0.3 mL IJ SOAJ injection Inject 0.3 mg into the muscle daily as needed.     [provider]   Allergies  Allergen Reactions  .  Citrus     Itchy throat   . Pork-Derived Products     Itchy throat   . Augmentin [Amoxicillin-Pot Clavulanate] Nausea And Vomiting    Has patient had a PCN reaction causing immediate rash, facial/tongue/throat swelling, SOB or lightheadedness with hypotension: No Has patient had a PCN reaction causing severe rash involving mucus membranes or skin necrosis: No Has patient had a PCN reaction that required hospitalization: No Has patient had a PCN reaction occurring within the last 10 years: Yes If all of the above answers are "NO",  then may proceed with Cephalosporin use.   . Latex Other (See Comments)    Blisters NOTE: Latex IgE NEGATIVE (<0.10) Around mouth causes a rash  . Morphine And Related Nausea And Vomiting    Patient does not want morphine at all.  . Cefadroxil Other (See Comments)    Severe cramping in side. Felt like ribs were going to explode  . Other Other (See Comments)    Lettuce causes her to start with diverticulitis  . Pollen Extract Other (See Comments)    Trees, grass, mold, dust causes raspy voice, nasal irritation  . Tape Other (See Comments)    Blisters. Paper tape is acceptable    FAMILY HISTORY:  family history includes Diabetes in her father; Heart disease in her father; Ovarian cancer in her maternal grandmother. SOCIAL HISTORY:  reports that she quit smoking about 32 years ago. She has a 10.00 pack-year smoking history. She has never used smokeless tobacco. She reports current alcohol use. She reports that she does not use drugs.  REVIEW OF SYSTEMS: Positives in BOLD  Constitutional: fever, chills, weight loss, malaise/fatigue and diaphoresis.  HENT: Negative for hearing loss, ear pain, nosebleeds, congestion, sore throat, neck pain, tinnitus and ear discharge.   Eyes: Negative for blurred vision, double vision, photophobia, pain, discharge and redness.  Respiratory: cough, hemoptysis, sputum production, shortness of breath, wheezing and stridor.   Cardiovascular: Negative for chest pain, palpitations, orthopnea, claudication, leg swelling and PND.  Gastrointestinal: heartburn, nausea, vomiting, abdominal pain, diarrhea, constipation, blood in stool and melena.  Genitourinary: Negative for dysuria, urgency, frequency, hematuria and flank pain.  Musculoskeletal: Negative for myalgias, back pain, joint pain and falls.  Skin: Negative for itching and rash.  Neurological: Negative for dizziness, tingling, tremors, sensory change, speech change, focal weakness, seizures, loss of  consciousness, weakness and headaches.  Endo/Heme/Allergies: Negative for environmental allergies and polydipsia. Does not bruise/bleed easily.  SUBJECTIVE:  Pt stating she is thirsty, no complaints at this time   VITAL SIGNS: Temp:  [98 F (36.7 C)-99.2 F (37.3 C)] 98.4 F (36.9 C) (01/02 0900) Pulse Rate:  [72-108] 92 (01/02 1000) Resp:  [12-36] 12 (01/02 1000) BP: (100-174)/(70-120) 100/73 (01/02 1000) SpO2:  [82 %-100 %] 86 % (01/02 1000) FiO2 (%):  [100 %] 100 % (01/01 2347) Weight:  [89.1 kg] 89.1 kg (01/01 1735)  PHYSICAL EXAMINATION: General: acutely ill appearing female, resting in bed, NAD  Neuro: alert and oriented, follows commands HEENT: supple, no JVD Cardiovascular: sinus tach, no R/G  Lungs: faint crackles throughout, even, non labored  Abdomen: +BS x4, obese, soft, non tender, non distended  Musculoskeletal: normal bulk and tone, no edema  Skin: intact no rashes or lesions present   Recent Labs  Lab 05/05/19 1248 05/06/19 0505 05/07/19 0514  NA 134* 139 142  K 3.3* 3.8 3.8  CL 95* 102 99  CO2 26 28 31   BUN 27* 24* 31*  CREATININE 0.95 0.71  0.90  GLUCOSE 259* 168* 251*   Recent Labs  Lab 05/05/19 1248 05/06/19 0505 05/07/19 0514  HGB 12.1 12.1 13.7  HCT 38.3 38.3 44.1  WBC 11.4* 10.6* 14.8*  PLT 264 236 276   DG Chest Port 1 View  Result Date: 05/05/2019 CLINICAL DATA:  Sent from dr Hyacinth Meeker. + covid, bilateral PNA. Put on oxygen for by PCP Monday, sats in low 80s at office on 3 L [place]. Received 80 mg kenalog there EXAM: PORTABLE CHEST 1 VIEW COMPARISON:  Chest radiograph 03/03/2014 FINDINGS: Stable cardiomediastinal contours with enlarged heart size. There are new diffuse bilateral patchy airspace opacities. No pneumothorax or large pleural effusion. No acute finding in the visualized skeleton. IMPRESSION: 1. New diffuse bilateral patchy airspace opacities most likely representing multifocal pneumonia. 2. Cardiomegaly. Electronically Signed   By:  Emmaline Kluver M.D.   On: 05/05/2019 12:41    ASSESSMENT / PLAN:  Acute hypoxic respiratory failure secondary to COVID-19 pneumonia  Hx: Asthma, Cor Pulmonale, OSA, and Morbid Obesity  Supplemental O2 to maintain O2 sats 88% or above  Continue remdesivir, decadron, and vitamins  Prn bronchodilator therapy and cough suppressant  Trend inflammatory markers  Trend WBC and monitor fever curve Follow cultures  OOB to chair as tolerated  Pulmonary hygiene Continue proning as tolerated due to severe hypoxia   Maintain airborne and contact precautions  Continuous telemetry monitoring   Borderline diabetes mellitus  CBG ac/hs SSI   Anxiety Continue outpatient xanax   Best Practice: VTE px: subq lovenox SUP px: po protonix  Diet: heart healthy    Critical care provider statement:    Critical care time (minutes):  33   Critical care time was exclusive of:  Separately billable procedures and  treating other patients   Critical care was necessary to treat or prevent imminent or  life-threatening deterioration of the following conditions:  covid 19 pneumonia, acute hypoxemic respiratory failure, multiple comorbid conditions   Critical care was time spent personally by me on the following  activities:  Development of treatment plan with patient or surrogate,  discussions with consultants, evaluation of patient's response to  treatment, examination of patient, obtaining history from patient or  surrogate, ordering and performing treatments and interventions, ordering  and review of laboratory studies and re-evaluation of patient's condition   I assumed direction of critical care for this patient from another  provider in my specialty: no       Vida Rigger, M.D.  Pulmonary & Critical Care Medicine  [facility]

## 2019-05-07 NOTE — Progress Notes (Signed)
ANTICOAGULATION CONSULT NOTE - Initial Consult  Pharmacy Consult for Enoxaparin Indication: DVT  Allergies  Allergen Reactions  . Citrus     Itchy throat   . Pork-Derived Products     Itchy throat   . Augmentin [Amoxicillin-Pot Clavulanate] Nausea And Vomiting    Has patient had a PCN reaction causing immediate rash, facial/tongue/throat swelling, SOB or lightheadedness with hypotension: No Has patient had a PCN reaction causing severe rash involving mucus membranes or skin necrosis: No Has patient had a PCN reaction that required hospitalization: No Has patient had a PCN reaction occurring within the last 10 years: Yes If all of the above answers are "NO", then may proceed with Cephalosporin use.   . Latex Other (See Comments)    Blisters NOTE: Latex IgE NEGATIVE (<0.10) Around mouth causes a rash  . Morphine And Related Nausea And Vomiting    Patient does not want morphine at all.  . Cefadroxil Other (See Comments)    Severe cramping in side. Felt like ribs were going to explode  . Other Other (See Comments)    Lettuce causes her to start with diverticulitis  . Pollen Extract Other (See Comments)    Trees, grass, mold, dust causes raspy voice, nasal irritation  . Tape Other (See Comments)    Blisters. Paper tape is acceptable    Patient Measurements: Height: 4\' 7"  (139.7 cm) Weight: 196 lb 6.9 oz (89.1 kg) IBW/kg (Calculated) : 34 Heparin Dosing Weight:    Vital Signs: Temp: 98.4 F (36.9 C) (01/02 0900) Temp Source: Axillary (01/02 0900) BP: 100/73 (01/02 1000) Pulse Rate: 92 (01/02 1000)  Labs: Recent Labs    05/05/19 1248 05/06/19 0505 05/07/19 0514  HGB 12.1 12.1 13.7  HCT 38.3 38.3 44.1  PLT 264 236 276  CREATININE 0.95 0.71 0.90  TROPONINIHS  --  13  --     Estimated Creatinine Clearance: 56.6 mL/min (by C-G formula based on SCr of 0.9 mg/dL).   Medical History: Past Medical History:  Diagnosis Date  . Acid reflux   . Allergic genetic state    . Anxiety   . Arthritis   . Asthma 1957  . Breast microcalcification, mammographic 2014  . Breast screening, unspecified   . Carpal tunnel syndrome 1999   repaired left wrist  . Cor pulmonale ([place])   . DDD (degenerative disc disease), lumbar   . Depression   . Diabetes mellitus without complication ([place]) 2694   borderline, follows diabetic diet often  . Environmental allergies   . Esophageal spasm   . Fecal smearing   . Headache    sinus migraines. takes OTC meds for allergies  . Heart murmur    as a child, not treated  . Hemorrhoids 2012  . History of hiatal hernia   . Hypertension   . Lump in thyroid 2019   having an ultrasound 10/2017 to rule out tumor or salivary gland enlargement  . Morbid obesity ([place])   . Osteoarthritis of spine with radiculopathy, lumbar region   . Other dysphagia   . Pain    chronic lbp,stenosis  . PONV (postoperative nausea and vomiting)    nausea and vomitting due to morphine  . Primary osteoarthritis of left knee   . Primary osteoarthritis of right hip   . Right thyroid nodule   . Shortness of breath dyspnea    increased with anxiety  . Sleep apnea    cpap   Assessment: Patient is a 64yo female being treated for  Covid. Now with new DVT. Pharmacy consulted for Enoxaparin dosing.   Plan:  Patient recevied Enoxaparin 40mg  SQ this morning at 10:00. Will transition patient to Enoxaparin 90mg  (1mg /kg) SQ q12h.  , PharmD, BCPS 05/07/2019 1:49 PM

## 2019-05-07 NOTE — Progress Notes (Signed)
Changed patient over to bipap due to low sats per RN in 60's. o2 sats upon my arrival noted 88%

## 2019-05-07 NOTE — Progress Notes (Signed)
PROGRESS NOTE    Deborah Miller  XVQ:[id number] DOB: [birth year] DOA: 05/05/2019 PCP: Danella Penton, MD   Brief Narrative: Deborah Miller is a 64 y.o. female with medical history significant of hypertension, diabetes mellitus, asthma, GERD, depression with anxiety, OSA, cor pulmonale, who presents with shortness of breath.  Patient states that she has been having cough and shortness of breath for nearly a week, she had a positive COVID-19 test. Her PCP started her on home oxygen on [place] and treated her with 80 mg of Kenalog. She states that she continues to have shortness of breath, which has been progressively worsening.  She has had some chest discomfort and congestion.  Patient reports nausea vomiting few days ago, which has resolved.  Currently no nausea, vomiting, diarrhea or abdominal pain.  No symptoms of UTI or unilateral weakness.She reports her home pulse oximetry was in the 70-80s.   Currently no fever or chills.  1/1: Patient with significant respiratory decompensation over interval.  On high flow nasal cannula, titrated up to 55 L.  Patient did not tolerate.  Was placed on BiPAP.  Tolerating BiPAP adequately.  Mentating clearly.  Mildly increased work of breathing.  1/2: Stepdown unit.  Continues to require BiPAP therapy.  Was weaned to high flow nasal cannula at 50 L last night.  Only tolerated for short amount of time.  This morning back on BiPAP therapy.  Mentating clearly.  Very worried about respiratory status.  Lower extremity Doppler positive for acute occlusive DVT in the left peroneal vein.  Assessment & Plan:   Principal Problem:   Acute respiratory disease due to COVID-19 virus Active Problems:   Esophageal reflux   Asthma   HTN (hypertension)   Depression with anxiety   Lactic acidosis   Hypokalemia   Acute respiratory failure due to COVID-19 (HCC)  COVID-19 pneumonia Acute respiratory failure with hypoxia secondary to above Left lower extremity DVT,  likely secondary to above Worsening oxygen saturation since admission Chest x-ray positive for bilateral infiltrate consistent with Covid infection Patient cannot tolerate transition from BiPAP to high flow nasal cannula Respiratory status too tenuous for CT, left lower extremity duplex pursued.  Positive for occlusive thrombus in the peroneal vein Plan: -Continue care in stepdown unit -Continue noninvasive positive pressure ventilation for now  -Titrate to high flow nasal cannula when able -Continue remdesivir per  pharmacy dosing regimen (day 3 of 5) -Switch steroids to dexamethasone 6 mg IV daily, plan for 10-day course (day 3 of 10) -vitamin C, zinc.   -PRN lasix challenge -Bronchodilators -Maintain euvolemia, avoid IV fluids if possible -Prone positioning as tolerated -Should patient decompensate further, she is amenable to endotracheal intubation -Weight-based Lovenox for lower extremity DVT -Discussed with critical care attending.  Attempted to transfer patient to [facility] today given high oxygen requirement.  Received notification that [facility] cannot accept patient today.  Will reach out to [facility] transfer services tomorrow.  Esophageal reflux: -PPI  Asthma: -Bronchodilators  HTN:  --hold torsemid -hydralazine prn  Depression with anxiety: No SI or HI -continue home pramipexole, Xanax  Lactic acidosis: lactic acid 3.4 No IVF at this time  Stage III obesity, BMI 47.4 Counseled patient Nutrition consult  DVT prophylaxis: Lovenox  Code Status: Full Family Communication: Updated daughter Achilles Dunk ([phone]) on 05/07/19 disposition Plan: Pending clinical course  Consultants:   ICU- [facility]  Procedures:   BiPAP  Antimicrobials:   Remdesivir (05/05/19-  )   Subjective: Seen and examined  On BiPAP currently 12/7, 90% Mentating clearly Very anxious Mild increase WOB  Objective: Vitals:   05/07/19 1200  05/07/19 1300 05/07/19 1400 05/07/19 1415  BP: 125/86 98/75 123/76   Pulse: 98 (!) 102 95   Resp: (!) 28 15 (!) 30   Temp:      TempSrc:      SpO2: 90% (!) 88% (!) 89% (!) 89%  Weight:      Height:        Intake/Output Summary (Last 24 hours) at 05/07/2019 1502 Last data filed at 05/07/2019 1100 Gross per 24 hour  Intake 100 ml  Output 1900 ml  Net -1800 ml   Filed Weights   05/05/19 1153 05/06/19 1735  Weight: (S) 92.5 kg 89.1 kg    Examination:  General exam: Well-developed, well-nourished Respiratory system: Scattered rhonchi bilaterally. Mild increased work of breathing Cardiovascular system: S1 & S2 heard, RRR. No JVD, murmurs, rubs, gallops or clicks.  Trace pedal edema Gastrointestinal system: Obese, nontender nondistended, positive bowel sounds Central nervous system: Alert and oriented. No focal neurological deficits. Extremities: Symmetric 5 x 5 power. Skin: No rashes, lesions or ulcers Psychiatry: Judgement and insight appear normal. Mood & affect appropriate.     Data Reviewed: I have personally reviewed following labs and imaging studies  CBC: Recent Labs  Lab 05/05/19 1248 05/06/19 0505 05/07/19 0514  WBC 11.4* 10.6* 14.8*  NEUTROABS 10.3* 9.6* 13.1*  HGB 12.1 12.1 13.7  HCT 38.3 38.3 44.1  MCV 79.1* 80.0 80.2  PLT 264 236 276   Basic Metabolic Panel: Recent Labs  Lab 05/05/19 1248 05/06/19 0505 05/07/19 0514  NA 134* 139 142  K 3.3* 3.8 3.8  CL 95* 102 99  CO2 26 28 31   GLUCOSE 259* 168* 251*  BUN 27* 24* 31*  CREATININE 0.95 0.71 0.90  CALCIUM 8.6* 8.4* 8.8*  MG  --  1.9 2.3   GFR: Estimated Creatinine Clearance: 56.6 mL/min (by C-G formula based on SCr of 0.9 mg/dL). Liver Function Tests: Recent Labs  Lab 05/05/19 1248 05/06/19 0505 05/07/19 0514  AST 56* 54* 52*  ALT 27 31 33  ALKPHOS 72 69 88  BILITOT 0.9 0.6 0.8  PROT 6.9 6.4* 6.9  ALBUMIN 3.2* 2.9* 3.0*   No results for input(s): LIPASE, AMYLASE in the last 168  hours. No results for input(s): AMMONIA in the last 168 hours. Coagulation Profile: No results for input(s): INR, PROTIME in the last 168 hours. Cardiac Enzymes: No results for input(s): CKTOTAL, CKMB, CKMBINDEX, TROPONINI in the last 168 hours. BNP (last 3 results) No results for input(s): PROBNP in the last 8760 hours. HbA1C: Recent Labs    05/05/19 1248  HGBA1C 7.6*   CBG: Recent Labs  Lab 05/05/19 2136 05/06/19 1735 05/06/19 2115 05/07/19 0817 05/07/19 1202  GLUCAP 259* 208* 236* 202* 175*   Lipid Profile: Recent Labs    05/05/19 1231  TRIG 230*   Thyroid Function Tests: No results for input(s): TSH, T4TOTAL, FREET4, T3FREE, THYROIDAB in the last 72 hours. Anemia Panel: Recent Labs    05/06/19 0505 05/07/19 0514  FERRITIN 191 199   Sepsis Labs: Recent Labs  Lab 05/05/19 1231 05/05/19 1248 05/06/19 1022 05/06/19 1925  PROCALCITON  --  <0.10  --   --   LATICACIDVEN 3.4* 5.6* 1.7 1.7    Recent Results (from the past 240 hour(s))  MRSA PCR Screening     Status: None   Collection Time: 05/05/19 11:52 AM   Specimen: Nasopharyngeal  Result Value Ref Range Status   MRSA by PCR NEGATIVE NEGATIVE Final    Comment:        The GeneXpert MRSA Assay (FDA approved for NASAL specimens only), is one component of a comprehensive MRSA colonization surveillance program. It is not intended to diagnose MRSA infection nor to guide or monitor treatment for MRSA infections. Performed at [facility], [address] [place], [place] [address]   Blood Culture (routine x 2)     Status: None (Preliminary result)   Collection Time: 05/05/19 12:31 PM   Specimen: BLOOD RIGHT HAND  Result Value Ref Range Status   Specimen Description BLOOD RIGHT HAND  Final   Special Requests   Final    BOTTLES DRAWN AEROBIC AND ANAEROBIC Blood Culture adequate volume   Culture   Final    NO GROWTH 2 DAYS Performed at [facility], [address], [place],  [place] [address]    Report Status PENDING  Incomplete  Blood Culture (routine x 2)     Status: None (Preliminary result)   Collection Time: 05/05/19 12:31 PM   Specimen: BLOOD RIGHT ARM  Result Value Ref Range Status   Specimen Description BLOOD RIGHT ARM  Final   Special Requests   Final    BOTTLES DRAWN AEROBIC AND ANAEROBIC Blood Culture adequate volume   Culture   Final    NO GROWTH 2 DAYS Performed at [facility], [address], [place], [place] [address]    Report Status PENDING  Incomplete         Radiology Studies: US Venous Img Lower Bilateral (DVT)  Result Date: 05/07/2019 CLINICAL DATA:  Shortness of breath. Positive COVID-19 infection. Evaluate for DVT. EXAM: BILATERAL LOWER EXTREMITY VENOUS DOPPLER ULTRASOUND TECHNIQUE: Gray-scale sonography with graded compression, as well as color Doppler and duplex ultrasound were performed to evaluate the lower extremity deep venous systems from the level of the common femoral vein and including the common femoral, femoral, profunda femoral, popliteal and calf veins including the posterior tibial, peroneal and gastrocnemius veins when visible. The superficial great saphenous vein was also interrogated. Spectral Doppler was utilized to evaluate flow at rest and with distal augmentation maneuvers in the common femoral, femoral and popliteal veins. COMPARISON:  Bilateral lower extremity venous Doppler ultrasound-04/22/2019 FINDINGS: RIGHT LOWER EXTREMITY Common Femoral Vein: No evidence of thrombus. Normal compressibility, respiratory phasicity and response to augmentation. Saphenofemoral Junction: No evidence of thrombus. Normal compressibility and flow on color Doppler imaging. Profunda Femoral Vein: No evidence of thrombus. Normal compressibility and flow on color Doppler imaging. Femoral Vein: No evidence of thrombus. Normal compressibility, respiratory phasicity and response to augmentation. Popliteal Vein: No evidence of thrombus.  Normal compressibility, respiratory phasicity and response to augmentation. Calf Veins: No evidence of thrombus. Normal compressibility and flow on color Doppler imaging. Superficial Great Saphenous Vein: No evidence of thrombus. Normal compressibility. Other Findings:  None. _________________________________________________________ LEFT LOWER EXTREMITY Common Femoral Vein: No evidence of thrombus. Normal compressibility, respiratory phasicity and response to augmentation. Saphenofemoral Junction: No evidence of thrombus. Normal compressibility and flow on color Doppler imaging. Profunda Femoral Vein: No evidence of thrombus. Normal compressibility and flow on color Doppler imaging. Femoral Vein: No evidence of thrombus. Normal compressibility, respiratory phasicity and response to augmentation. Popliteal Vein: No evidence of thrombus. Normal compressibility, respiratory phasicity and response to augmentation. Calf Veins: There is hypoechoic occlusive thrombus within the left peroneal vein (image 38 and 39. The left posterior tibial veins appear patent where imaged.  Superficial Great Saphenous Vein: No evidence of thrombus. Normal compressibility. Other Findings:  None. IMPRESSION: 1. The examination is positive for occlusive DVT involving the left peroneal vein. There is no extension of this distal left lower extremity tibial DVT to the more proximal venous system of the left lower extremity. 2. No evidence of DVT within the right lower extremity. Electronically Signed   By: Simonne Come M.D.   On: 05/07/2019 12:26        Scheduled Meds:  calcium-vitamin D  1 tablet Oral Daily   chlorhexidine  15 mL Mouth Rinse BID   Chlorhexidine Gluconate Cloth  6 each Topical Daily   dexamethasone (DECADRON) injection  6 mg Intravenous Q24H   dextromethorphan-guaiFENesin  1 tablet Oral BID   enoxaparin (LOVENOX) injection  1 mg/kg Subcutaneous Q12H   furosemide  80 mg Intravenous Once   gabapentin  300 mg  Oral BID   insulin aspart  0-5 Units Subcutaneous QHS   insulin aspart  0-9 Units Subcutaneous TID WC   lidocaine  1 patch Transdermal Daily   loratadine  10 mg Oral Daily   mouth rinse  15 mL Mouth Rinse q12n4p   Melatonin  5 mg Oral QHS   pantoprazole (PROTONIX) IV  40 mg Intravenous Q24H   pramipexole  0.125 mg Oral QHS   Continuous Infusions:  methocarbamol (ROBAXIN) IV     remdesivir 100 mg in NS 100 mL 100 mg (05/07/19 1145)     LOS: 2 days    Time spent: 35 minutes    Tresa Moore, MD [facility] Pager [phone]  If 7PM-7AM, please contact night-coverage [email or web address] Password [facility] 05/07/2019, 3:02 PM

## 2019-05-07 NOTE — Progress Notes (Signed)
Patient's oxygen drops when she swallows.  Patient is alert & oriented.  Patient paused and would breathe between bites of apple sauce.  Diet order was added back now that she has been off bipap. Patient is on high flow and a non-rebreather.  She lifts the non-rebreather to eat, she is well informed and follows instructions well.  Patient had pain during the shift and received morphine after zofran.  She reported N/V in the past from morphine, so MD told RN give zofran before giving morphine.  RN gave zofran, waited 10 minutes then gave morphine.  Patient reported some relief in pain and did not report any nausea.   Christean Grief, RN

## 2019-05-08 ENCOUNTER — Inpatient Hospital Stay: Payer: BC Managed Care – PPO

## 2019-05-08 DIAGNOSIS — U071 COVID-19: Principal | ICD-10-CM

## 2019-05-08 DIAGNOSIS — J9600 Acute respiratory failure, unspecified whether with hypoxia or hypercapnia: Secondary | ICD-10-CM

## 2019-05-08 DIAGNOSIS — J069 Acute upper respiratory infection, unspecified: Secondary | ICD-10-CM

## 2019-05-08 LAB — CBC WITH DIFFERENTIAL/PLATELET
Abs Immature Granulocytes: 0.12 10*3/uL — ABNORMAL HIGH (ref 0.00–0.07)
Basophils Absolute: 0 10*3/uL (ref 0.0–0.1)
Basophils Relative: 0 %
Eosinophils Absolute: 0 10*3/uL (ref 0.0–0.5)
Eosinophils Relative: 0 %
HCT: 38.8 % (ref 36.0–46.0)
Hemoglobin: 12.8 g/dL (ref 12.0–15.0)
Immature Granulocytes: 1 %
Lymphocytes Relative: 6 %
Lymphs Abs: 0.8 10*3/uL (ref 0.7–4.0)
MCH: 25.4 pg — ABNORMAL LOW (ref 26.0–34.0)
MCHC: 33 g/dL (ref 30.0–36.0)
MCV: 77.1 fL — ABNORMAL LOW (ref 80.0–100.0)
Monocytes Absolute: 0.3 10*3/uL (ref 0.1–1.0)
Monocytes Relative: 3 %
Neutro Abs: 11.6 10*3/uL — ABNORMAL HIGH (ref 1.7–7.7)
Neutrophils Relative %: 90 %
Platelets: 242 10*3/uL (ref 150–400)
RBC: 5.03 MIL/uL (ref 3.87–5.11)
RDW: 15.1 % (ref 11.5–15.5)
WBC: 12.9 10*3/uL — ABNORMAL HIGH (ref 4.0–10.5)
nRBC: 0 % (ref 0.0–0.2)

## 2019-05-08 LAB — GLUCOSE, CAPILLARY
Glucose-Capillary: 232 mg/dL — ABNORMAL HIGH (ref 70–99)
Glucose-Capillary: 307 mg/dL — ABNORMAL HIGH (ref 70–99)
Glucose-Capillary: 224 mg/dL — ABNORMAL HIGH (ref 70–99)
Glucose-Capillary: 330 mg/dL — ABNORMAL HIGH (ref 70–99)

## 2019-05-08 LAB — COMPREHENSIVE METABOLIC PANEL
ALT: 28 U/L (ref 0–44)
AST: 36 U/L (ref 15–41)
Albumin: 2.6 g/dL — ABNORMAL LOW (ref 3.5–5.0)
Alkaline Phosphatase: 90 U/L (ref 38–126)
Anion gap: 11 (ref 5–15)
BUN: 36 mg/dL — ABNORMAL HIGH (ref 8–23)
CO2: 31 mmol/L (ref 22–32)
Calcium: 8.4 mg/dL — ABNORMAL LOW (ref 8.9–10.3)
Chloride: 97 mmol/L — ABNORMAL LOW (ref 98–111)
Creatinine, Ser: 0.76 mg/dL (ref 0.44–1.00)
GFR calc Af Amer: >60 mL/min (ref >60)
GFR calc non Af Amer: >60 mL/min (ref >60)
Glucose, Bld: 242 mg/dL — ABNORMAL HIGH (ref 70–99)
Potassium: 4 mmol/L (ref 3.5–5.1)
Sodium: 139 mmol/L (ref 135–145)
Total Bilirubin: 0.7 mg/dL (ref 0.3–1.2)
Total Protein: 6.1 g/dL — ABNORMAL LOW (ref 6.5–8.1)

## 2019-05-08 LAB — FERRITIN: Ferritin: 156 ng/mL (ref 11–307)

## 2019-05-08 LAB — C-REACTIVE PROTEIN: CRP: 11 mg/dL — ABNORMAL HIGH (ref <1.0)

## 2019-05-08 LAB — D-DIMER, QUANTITATIVE: D-Dimer, Quant: 9.56 ug/mL-FEU — ABNORMAL HIGH (ref 0.00–0.50)

## 2019-05-08 LAB — MAGNESIUM: Magnesium: 2.6 mg/dL — ABNORMAL HIGH (ref 1.7–2.4)

## 2019-05-08 MED ORDER — ENSURE MAX PROTEIN PO LIQD
11.0000 [oz_av] | Freq: Three times a day (TID) | ORAL | Status: DC
  Administered 2019-05-09 – 2019-05-12 (×3): 11 [oz_av] via ORAL
  Filled 2019-05-08: qty 330

## 2019-05-08 MED ORDER — DEXAMETHASONE SODIUM PHOSPHATE 10 MG/ML IJ SOLN
6.0000 mg | Freq: Four times a day (QID) | INTRAMUSCULAR | Status: DC
  Administered 2019-05-08 – 2019-05-09 (×5): 6 mg via INTRAVENOUS
  Filled 2019-05-08 (×6): qty 0.6

## 2019-05-08 MED ORDER — SODIUM CHLORIDE 0.9 % IV SOLN
INTRAVENOUS | Status: DC | PRN
  Administered 2019-05-08: 250 mL via INTRAVENOUS

## 2019-05-08 NOTE — Progress Notes (Signed)
clinical status relayed to family  Updated and notified of patients medical condition- Severe resp failure  Family are satisfied with Plan of action and management. All questions answered  Lucie Leather, M.D.  [facility]  Medical Director [facility] Medical Director [facility] Cardio-Pulmonary Department

## 2019-05-08 NOTE — Progress Notes (Addendum)
Shift summary:  - Patient remains on either BiPAP or HFNC, unable to wean.  - Goal SPO2 is 70%.  - SQ emphysema noticed on exam. Dr. Belia Heman notified. CXR ordered.   - I+O cath performed X 1. 1000 mL urine drained.

## 2019-05-08 NOTE — Progress Notes (Signed)
ANTICOAGULATION CONSULT NOTE - Initial Consult  Pharmacy Consult for Enoxaparin Indication: DVT  Allergies  Allergen Reactions  . Citrus     Itchy throat   . Pork-Derived Products     Itchy throat   . Augmentin [Amoxicillin-Pot Clavulanate] Nausea And Vomiting    Has patient had a PCN reaction causing immediate rash, facial/tongue/throat swelling, SOB or lightheadedness with hypotension: No Has patient had a PCN reaction causing severe rash involving mucus membranes or skin necrosis: No Has patient had a PCN reaction that required hospitalization: No Has patient had a PCN reaction occurring within the last 10 years: Yes If all of the above answers are "NO", then may proceed with Cephalosporin use.   . Latex Other (See Comments)    Blisters NOTE: Latex IgE NEGATIVE (<0.10) Around mouth causes a rash  . Morphine And Related Nausea And Vomiting    Patient does not want morphine at all.  . Cefadroxil Other (See Comments)    Severe cramping in side. Felt like ribs were going to explode  . Other Other (See Comments)    Lettuce causes her to start with diverticulitis  . Pollen Extract Other (See Comments)    Trees, grass, mold, dust causes raspy voice, nasal irritation  . Tape Other (See Comments)    Blisters. Paper tape is acceptable    Patient Measurements: Height: 4\' 7"  (139.7 cm) Weight: 196 lb 6.9 oz (89.1 kg) IBW/kg (Calculated) : 34 Heparin Dosing Weight:    Vital Signs: Temp: 97.9 F (36.6 C) (01/03 1200) Temp Source: Axillary (01/03 1200) BP: 128/111 (01/03 1300) Pulse Rate: 88 (01/03 1300)  Labs: Recent Labs    05/06/19 0505 05/07/19 0514 05/08/19 0418  HGB 12.1 13.7 12.8  HCT 38.3 44.1 38.8  PLT 236 276 242  CREATININE 0.71 0.90 0.76  TROPONINIHS 13  --   --     Estimated Creatinine Clearance: 63.6 mL/min (by C-G formula based on SCr of 0.76 mg/dL).   Medical History: Past Medical History:  Diagnosis Date  . Acid reflux   . Allergic genetic state    . Anxiety   . Arthritis   . Asthma 1957  . Breast microcalcification, mammographic 2014  . Breast screening, unspecified   . Carpal tunnel syndrome 1999   repaired left wrist  . Cor pulmonale ([place])   . DDD (degenerative disc disease), lumbar   . Depression   . Diabetes mellitus without complication ([place]) 4315   borderline, follows diabetic diet often  . Environmental allergies   . Esophageal spasm   . Fecal smearing   . Headache    sinus migraines. takes OTC meds for allergies  . Heart murmur    as a child, not treated  . Hemorrhoids 2012  . History of hiatal hernia   . Hypertension   . Lump in thyroid 2019   having an ultrasound 10/2017 to rule out tumor or salivary gland enlargement  . Morbid obesity ([place])   . Osteoarthritis of spine with radiculopathy, lumbar region   . Other dysphagia   . Pain    chronic lbp,stenosis  . PONV (postoperative nausea and vomiting)    nausea and vomitting due to morphine  . Primary osteoarthritis of left knee   . Primary osteoarthritis of right hip   . Right thyroid nodule   . Shortness of breath dyspnea    increased with anxiety  . Sleep apnea    cpap   Assessment: Patient is a 64yo female being treated for  Covid. Now with new DVT. Pharmacy consulted for Enoxaparin dosing. Pt has a listed pork allergy, no reaction noted as of yet. Continue to monitor.    Plan:  Patient on enoxaparin 90mg  (1mg /kg) SQ q12h.  , PharmD, BCPS 05/08/2019 1:53 PM

## 2019-05-08 NOTE — Progress Notes (Signed)
Name: Deborah Miller MRN: [id number] DOB: 31-Oct-1955    ADMISSION DATE:  05/05/2019 CONSULTATION DATE: 05/05/2018  REFERRING MD : Dr. Georgeann Oppenheim   CHIEF COMPLAINT: Shortness of Breath   BRIEF PATIENT DESCRIPTION:  64 yo female initially diagnosed with COVID-19 in the outpatient setting on 12/23 currently admitted with acute hypoxic respiratory failure secondary to COVID-19 pneumonia requiring HFNC and NRB  SIGNIFICANT EVENTS/STUDIES:  12/31: Pt presented to the ER from PCP with worsening acute hypoxic respiratory failure secondary to COVID-19 pneumonia 01/1: Pt admitted to ICU on Bipap.  Later transitioned off Bipap to HFNC and NRB 1/3 severe hypoxia, high risk for intubation and death  HISTORY OF PRESENT ILLNESS:  This is a 64 yo female with a PMH of OSA (wear CPAP qhs), Dysphagia, Morbid Obesity, Right Thyroid Nodule, HTN, Hemorrhoids, Migraine Headaches, Esophageal Spasms, Environmental Allergies, Borderline Diabetes, Depression, Cor Pulmonale, Asthma, Arthritis, Anxiety, and Acid Reflux.  She presented to [facility] ER on 12/31 from her PCP office with worsening acute hypoxic respiratory failure secondary to COVID-19.  She was initially diagnosed with COVID-19 on 12/23 by her PCP per outpatient provider she received a kenalog injection and instructed to take clarithromycin and prednisone.  She later developed worsening hypoxia prompting PCP office visit on 12/29 and pt prescribed home O2 @2L .  On 12/31 due to worsening acute hypoxic respiratory failure (pulse ox readings at home in the 70's) despite home O2 she saw her outpatient PCP and was instructed to proceed to the ER.  Upon arrival to the ER pts O2 sats on 4L were in the 90's. CXR concerning for multifocal pneumonia and cardiomegaly.  She received remdesivir.  She was initially admitted to the [facility] unit per hospitalist team, however due to worsening hypoxia pt admitted to the stepdown unit.  PCCM consulted for additional  management.   CC Follow up Resp failure  HPI Severe hypoxia NRB mask High risk for intubation/death  PAST MEDICAL HISTORY :   has a past medical history of Acid reflux, Allergic genetic state, Anxiety, Arthritis, Asthma (1957), Breast microcalcification, mammographic (2014), Breast screening, unspecified, Carpal tunnel syndrome (1999), Cor pulmonale (HCC), DDD (degenerative disc disease), lumbar, Depression, Diabetes mellitus without complication (HCC) (2019), Environmental allergies, Esophageal spasm, Fecal smearing, Headache, Heart murmur, Hemorrhoids (2012), History of hiatal hernia, Hypertension, Lump in thyroid (2019), Morbid obesity (HCC), Osteoarthritis of spine with radiculopathy, lumbar region, Other dysphagia, Pain, PONV (postoperative nausea and vomiting), Primary osteoarthritis of left knee, Primary osteoarthritis of right hip, Right thyroid nodule, Shortness of breath dyspnea, and Sleep apnea.  has a past surgical history that includes Nose surgery (2001); ganglion cyst removal  (Left, 1988); Dilation and curettage of uterus (1987); tonsilloadenoidectomy (2003); Cholecystectomy (2002); Colonoscopy (2009); carpal tunnel--left hand  (Left, 2012); Upper gi endoscopy (2009, 2015); left breast biopsy (Left, 2006); Bunionectomy (Left, 1996); Nasal septum surgery (1975); neck injection (Right, 2014); Total hip arthroplasty (Right, 02/14/2015); Knee surgery (Left, 2014); Colonoscopy with propofol (N/A, 08/20/2016); Back surgery (2010); Tubal ligation; Joint replacement (Right, 2016); Tonsillectomy (1991); Knee Arthroplasty (Right, 10/28/2017); Anterior fusion cervical spine; Mouth lesion excisional biopsy; Esophagogastroduodenoscopy (egd) with propofol (N/A, 04/30/2018); Breast excisional biopsy (Left, 2014); and Breast biopsy (Left). Prior to Admission medications   Medication Sig Start Date End Date Taking? Authorizing Provider  acetaminophen (TYLENOL) 500 MG tablet Take 650 mg by mouth every 6  (six) hours as needed for moderate pain or headache. If no relief of pain within one hour, patient takes a second one.   Yes [provider]  ADVAIR HFA 115-21 MCG/ACT inhaler Inhale 2 puffs into the lungs 2 (two) times daily.  10/10/12  Yes [provider]  albuterol (PROVENTIL HFA;VENTOLIN HFA) 108 (90 BASE) MCG/ACT inhaler Inhale 2 puffs into the lungs every 6 (six) hours as needed for wheezing or shortness of breath.    Yes [provider]  ALPRAZolam (XANAX) 0.25 MG tablet Take 1 tablet (0.25 mg total) by mouth daily. 11/02/17  Yes Toni Arthurs, NP  b complex vitamins tablet Take 1 tablet by mouth daily.   Yes [provider]  calcium carbonate (TUMS - DOSED IN MG ELEMENTAL CALCIUM) 500 MG chewable tablet Chew 1-3 tablets by mouth 2 (two) times daily as needed for indigestion or heartburn.    Yes [provider]  Calcium Carbonate-Vitamin D (CALCIUM 600 + D PO) Take 1 tablet by mouth daily.   Yes [provider]  cetirizine (ZYRTEC) 10 MG tablet Take 10 mg by mouth daily.   Yes [provider]  Chlorpheniramine-DM (CORICIDIN COUGH/COLD) 4-30 MG TABS Take 1 tablet by mouth 2 (two) times daily as needed (cough / cold symptoms).   Yes [provider]  cholestyramine light (PREVALITE) 4 GM/DOSE powder Take 4 g by mouth daily as needed (GI distress).    Yes [provider]  clarithromycin (BIAXIN) 500 MG tablet Take 500 mg by mouth 2 (two) times daily. 04/27/19  Yes [provider]  docusate sodium (COLACE) 100 MG capsule Take 100 mg by mouth daily.    Yes [provider]  gabapentin (NEURONTIN) 300 MG capsule Take 300 mg by mouth 2 (two) times daily.  10/10/12  Yes [provider]  L-Lysine 500 MG CAPS Take 500 mg by mouth daily.    Yes [provider]  lidocaine (LIDODERM) 5 % Place 1 patch onto the skin daily. 04/15/19  Yes [provider]  Melatonin 5 MG TABS Take 5 mg by mouth  at bedtime.    Yes [provider]  meloxicam (MOBIC) 15 MG tablet Take 7.5 mg by mouth 2 (two) times daily.    Yes [provider]  Multiple Vitamin (MULTIVITAMIN) tablet Take 1 tablet by mouth daily.   Yes [provider]  omeprazole (PRILOSEC) 20 MG capsule Take 20 mg by mouth daily.    Yes [provider]  polyvinyl alcohol (LIQUIFILM TEARS) 1.4 % ophthalmic solution Place 1 drop into both eyes 3 (three) times daily as needed for dry eyes.    Yes [provider]  potassium chloride (K-DUR) 10 MEQ tablet Take 10 mEq by mouth daily.    Yes [provider]  pramipexole (MIRAPEX) 0.125 MG tablet Take 0.125 mg by mouth at bedtime.    Yes [provider]  predniSONE (DELTASONE) 20 MG tablet Take 20 mg by mouth 2 (two) times daily. 05/03/19  Yes [provider]  sodium chloride (OCEAN) 0.65 % SOLN nasal spray Place 1 spray into both nostrils every 2 (two) hours as needed for congestion.    Yes [provider]  SPIRIVA HANDIHALER 18 MCG inhalation capsule Place 1 capsule into inhaler and inhale daily. 10/22/12  Yes [provider]  torsemide (DEMADEX) 20 MG tablet Take 20 mg by mouth daily.  10/22/12  Yes [provider]  traMADol (ULTRAM) 50 MG tablet Take 1-2 tablets (50-100 mg total) by mouth every 4 (four) hours as needed for moderate pain. 11/09/17  Yes Gerlene Fee, NP  EPINEPHrine (AUVI-Q) 0.3 mg/0.3 mL  IJ SOAJ injection Inject 0.3 mg into the muscle daily as needed.     [provider]   Allergies  Allergen Reactions  . Citrus     Itchy throat   . Pork-Derived Products     Itchy throat   . Augmentin [Amoxicillin-Pot Clavulanate] Nausea And Vomiting    Has patient had a PCN reaction causing immediate rash, facial/tongue/throat swelling, SOB or lightheadedness with hypotension: No Has patient had a PCN reaction causing severe rash involving mucus membranes or skin necrosis: No Has  patient had a PCN reaction that required hospitalization: No Has patient had a PCN reaction occurring within the last 10 years: Yes If all of the above answers are "NO", then may proceed with Cephalosporin use.   . Latex Other (See Comments)    Blisters NOTE: Latex IgE NEGATIVE (<0.10) Around mouth causes a rash  . Morphine And Related Nausea And Vomiting    Patient does not want morphine at all.  . Cefadroxil Other (See Comments)    Severe cramping in side. Felt like ribs were going to explode  . Other Other (See Comments)    Lettuce causes her to start with diverticulitis  . Pollen Extract Other (See Comments)    Trees, grass, mold, dust causes raspy voice, nasal irritation  . Tape Other (See Comments)    Blisters. Paper tape is acceptable    FAMILY HISTORY:  family history includes Diabetes in her father; Heart disease in her father; Ovarian cancer in her maternal grandmother. SOCIAL HISTORY:  reports that she quit smoking about 32 years ago. She has a 10.00 pack-year smoking history. She has never used smokeless tobacco. She reports current alcohol use. She reports that she does not use drugs.  COVID-19 DISASTER DECLARATION:   FULL CONTACT PHYSICAL EXAMINATION WAS NOT POSSIBLE DUE TO TREATMENT OF COVID-19 AND   CONSERVATION OF PERSONAL PROTECTIVE EQUIPMENT, LIMITED EXAM FINDINGS INCLUDE-   Patient assessed or the symptoms described in the history of present illness.   In the context of the Global COVID-19 pandemic, which necessitated consideration that the patient might be at risk for infection with the SARS-CoV-2 virus that causes COVID-19, Institutional protocols and algorithms that pertain to the evaluation of patients at risk for COVID-19 are in a state of rapid change based on information released by regulatory bodies including the CDC and federal and state organizations. These policies and algorithms were followed during the patient's care while in hospital.      VITAL  SIGNS: Temp:  [98 F (36.7 C)-99 F (37.2 C)] 98 F (36.7 C) (01/03 0200) Pulse Rate:  [75-115] 75 (01/03 0600) Resp:  [12-34] 26 (01/03 0600) BP: (98-146)/(61-97) 121/75 (01/03 0400) SpO2:  [75 %-90 %] 79 % (01/03 0600) FiO2 (%):  [90 %-100 %] 90 % (01/02 1912)    Recent Labs  Lab 05/06/19 0505 05/07/19 0514 05/08/19 0418  NA 139 142 139  K 3.8 3.8 4.0  CL 102 99 97*  CO2 28 31 31   BUN 24* 31* 36*  CREATININE 0.71 0.90 0.76  GLUCOSE 168* 251* 242*   Recent Labs  Lab 05/06/19 0505 05/07/19 0514 05/08/19 0418  HGB 12.1 13.7 12.8  HCT 38.3 44.1 38.8  WBC 10.6* 14.8* 12.9*  PLT 236 276 242   07/06/19 Venous Img Lower Bilateral (DVT)  Result Date: 05/07/2019 CLINICAL DATA:  Shortness of breath. Positive COVID-19 infection. Evaluate for DVT. EXAM: BILATERAL LOWER EXTREMITY VENOUS DOPPLER ULTRASOUND TECHNIQUE: Gray-scale sonography with graded compression, as well  as color Doppler and duplex ultrasound were performed to evaluate the lower extremity deep venous systems from the level of the common femoral vein and including the common femoral, femoral, profunda femoral, popliteal and calf veins including the posterior tibial, peroneal and gastrocnemius veins when visible. The superficial great saphenous vein was also interrogated. Spectral Doppler was utilized to evaluate flow at rest and with distal augmentation maneuvers in the common femoral, femoral and popliteal veins. COMPARISON:  Bilateral lower extremity venous Doppler ultrasound-04/22/2019 FINDINGS: RIGHT LOWER EXTREMITY Common Femoral Vein: No evidence of thrombus. Normal compressibility, respiratory phasicity and response to augmentation. Saphenofemoral Junction: No evidence of thrombus. Normal compressibility and flow on color Doppler imaging. Profunda Femoral Vein: No evidence of thrombus. Normal compressibility and flow on color Doppler imaging. Femoral Vein: No evidence of thrombus. Normal compressibility, respiratory phasicity  and response to augmentation. Popliteal Vein: No evidence of thrombus. Normal compressibility, respiratory phasicity and response to augmentation. Calf Veins: No evidence of thrombus. Normal compressibility and flow on color Doppler imaging. Superficial Great Saphenous Vein: No evidence of thrombus. Normal compressibility. Other Findings:  None. _________________________________________________________ LEFT LOWER EXTREMITY Common Femoral Vein: No evidence of thrombus. Normal compressibility, respiratory phasicity and response to augmentation. Saphenofemoral Junction: No evidence of thrombus. Normal compressibility and flow on color Doppler imaging. Profunda Femoral Vein: No evidence of thrombus. Normal compressibility and flow on color Doppler imaging. Femoral Vein: No evidence of thrombus. Normal compressibility, respiratory phasicity and response to augmentation. Popliteal Vein: No evidence of thrombus. Normal compressibility, respiratory phasicity and response to augmentation. Calf Veins: There is hypoechoic occlusive thrombus within the left peroneal vein (image 38 and 39. The left posterior tibial veins appear patent where imaged. Superficial Great Saphenous Vein: No evidence of thrombus. Normal compressibility. Other Findings:  None. IMPRESSION: 1. The examination is positive for occlusive DVT involving the left peroneal vein. There is no extension of this distal left lower extremity tibial DVT to the more proximal venous system of the left lower extremity. 2. No evidence of DVT within the right lower extremity. Electronically Signed   By: Simonne Come M.D.   On: 05/07/2019 12:26    ASSESSMENT / PLAN:  Severe ACUTE Hypoxic and Hypercapnic Respiratory Failure NRB mas and high flow [place] as needed Keep o2 sats 70% Severe COVID-19 infection IV steroids increased steroid frequency IV remdesivir Aggressive pulm toilet recommended Frequent proning OOB to chair as tolerated  Pulmonary hygiene Continue  proning as tolerated due to severe hypoxia   Maintain airborne and contact precautions  Continuous telemetry monitoring  Continue Heparin infusion  ENDO - ICU hypoglycemic\Hyperglycemia protocol -check FSBS per protocol   ELECTROLYTES -follow labs as needed -replace as needed -pharmacy consultation and following   Critical Care Time devoted to patient care services described in this note is 34 minutes.   Overall, patient is critically ill, prognosis is guarded.    Lucie Leather, M.D.  [facility]  Medical Director [facility] Medical Director [facility] Cardio-Pulmonary Department

## 2019-05-08 NOTE — Progress Notes (Signed)
Initial Nutrition Assessment  RD working remotely.  DOCUMENTATION CODES:   Morbid obesity  INTERVENTION:  Recommend liberalizing diet to carbohydrate-modified.  Provide Ensure Max Protein po TID when patient able to come off BiPAP. Each supplement provides 150 kcal and 30 grams of protein.  NUTRITION DIAGNOSIS:   Increased nutrient needs related to catabolic illness(COVID-19) as evidenced by estimated needs.  GOAL:   Patient will meet greater than or equal to 90% of their needs  MONITOR:   PO intake, Supplement acceptance, Labs, Weight trends, I & O's  REASON FOR ASSESSMENT:   Malnutrition Screening Tool    ASSESSMENT:   64 year old female with PMHx of asthma, HTN, sleep apnea, depression, anxiety, DM, hx hiatal hernia, arhtritis, esophageal spasm, dysphagia admitted with COVID-19 PNA (initially diagnosed 12/23).   Patient currently on BiPAP so unable to talk on the phone. Discussed with RN and she is only able to come off for brief periods of time. According to chart patient has high risk for intubation. Patient with increased calorie/protein needs in setting of catabolic nature of COVID-19.  Weight difficult to trend as it fluctuates in chart from 89-95 kg.  Medications reviewed and include: Oscal with D 1 tablet daily, Decadron 6 mg Q6hrs IV, gabapentin, Novolog 0-9 units TID, Melatonin 5 mg QHS, pantoprazole, remdesivir.  Labs reviewed: CBG 224-307, Chloride 97, BUN 36.   NUTRITION - FOCUSED PHYSICAL EXAM:  Unable to complete.  Diet Order:   Diet Order            Diet heart healthy/carb modified Room service appropriate? Yes; Fluid consistency: Thin  Diet effective now             EDUCATION NEEDS:   No education needs have been identified at this time  Skin:  Skin Assessment: Reviewed RN Assessment  Last BM:  1/3 medium type 7  Height:   Ht Readings from Last 1 Encounters:  05/06/19 4\' 7"  (1.397 m)   Weight:   Wt Readings from Last 1  Encounters:  05/06/19 89.1 kg   Ideal Body Weight:  41.6 kg  BMI:  Body mass index is 45.65 kg/m.  Estimated Nutritional Needs:   Kcal:  1800-2000  Protein:  90-100 grams  Fluid:  1.8-2 L/day  07/04/19, MS, RD, LDN Office: [phone] Pager: [phone] After Hours/Weekend Pager: [phone]

## 2019-05-08 NOTE — Plan of Care (Signed)

## 2019-05-08 NOTE — Progress Notes (Signed)
Brief IM note:  Patient discussed with Dr. Belia Heman.  Severe hypoxia, currently BiPAP dependent.  Minimal response to diuretic challenge.  Not able to tolerate proning.  Desaturates quickly upon removal of NIPPV.  PCCM to assume primary care of patient.  Hospitalist service to sign off at this time.  Will re-assume care once patient stable for downgrade.  Appreciate assistance from ICU team.    Lolita Patella MD

## 2019-05-09 LAB — COMPREHENSIVE METABOLIC PANEL
ALT: 26 U/L (ref 0–44)
AST: 31 U/L (ref 15–41)
Albumin: 2.9 g/dL — ABNORMAL LOW (ref 3.5–5.0)
Alkaline Phosphatase: 107 U/L (ref 38–126)
Anion gap: 12 (ref 5–15)
BUN: 42 mg/dL — ABNORMAL HIGH (ref 8–23)
CO2: 32 mmol/L (ref 22–32)
Calcium: 8.8 mg/dL — ABNORMAL LOW (ref 8.9–10.3)
Chloride: 93 mmol/L — ABNORMAL LOW (ref 98–111)
Creatinine, Ser: 0.81 mg/dL (ref 0.44–1.00)
GFR calc Af Amer: >60 mL/min (ref >60)
GFR calc non Af Amer: >60 mL/min (ref >60)
Glucose, Bld: 252 mg/dL — ABNORMAL HIGH (ref 70–99)
Potassium: 4.3 mmol/L (ref 3.5–5.1)
Sodium: 137 mmol/L (ref 135–145)
Total Bilirubin: 0.8 mg/dL (ref 0.3–1.2)
Total Protein: 6.8 g/dL (ref 6.5–8.1)

## 2019-05-09 LAB — CBC WITH DIFFERENTIAL/PLATELET
Abs Immature Granulocytes: 0.09 10*3/uL — ABNORMAL HIGH (ref 0.00–0.07)
Basophils Absolute: 0 10*3/uL (ref 0.0–0.1)
Basophils Relative: 0 %
Eosinophils Absolute: 0 10*3/uL (ref 0.0–0.5)
Eosinophils Relative: 0 %
HCT: 41.5 % (ref 36.0–46.0)
Hemoglobin: 13.7 g/dL (ref 12.0–15.0)
Immature Granulocytes: 1 %
Lymphocytes Relative: 5 %
Lymphs Abs: 0.7 10*3/uL (ref 0.7–4.0)
MCH: 25.3 pg — ABNORMAL LOW (ref 26.0–34.0)
MCHC: 33 g/dL (ref 30.0–36.0)
MCV: 76.6 fL — ABNORMAL LOW (ref 80.0–100.0)
Monocytes Absolute: 0.3 10*3/uL (ref 0.1–1.0)
Monocytes Relative: 2 %
Neutro Abs: 13.6 10*3/uL — ABNORMAL HIGH (ref 1.7–7.7)
Neutrophils Relative %: 92 %
Platelets: 238 10*3/uL (ref 150–400)
RBC: 5.42 MIL/uL — ABNORMAL HIGH (ref 3.87–5.11)
RDW: 14.5 % (ref 11.5–15.5)
WBC: 14.7 10*3/uL — ABNORMAL HIGH (ref 4.0–10.5)
nRBC: 0 % (ref 0.0–0.2)

## 2019-05-09 LAB — C-REACTIVE PROTEIN: CRP: 9 mg/dL — ABNORMAL HIGH (ref <1.0)

## 2019-05-09 LAB — GLUCOSE, CAPILLARY
Glucose-Capillary: 241 mg/dL — ABNORMAL HIGH (ref 70–99)
Glucose-Capillary: 314 mg/dL — ABNORMAL HIGH (ref 70–99)
Glucose-Capillary: 306 mg/dL — ABNORMAL HIGH (ref 70–99)
Glucose-Capillary: 304 mg/dL — ABNORMAL HIGH (ref 70–99)
Glucose-Capillary: 333 mg/dL — ABNORMAL HIGH (ref 70–99)

## 2019-05-09 LAB — D-DIMER, QUANTITATIVE: D-Dimer, Quant: 4.2 ug/mL-FEU — ABNORMAL HIGH (ref 0.00–0.50)

## 2019-05-09 LAB — FERRITIN: Ferritin: 161 ng/mL (ref 11–307)

## 2019-05-09 LAB — MAGNESIUM: Magnesium: 2.5 mg/dL — ABNORMAL HIGH (ref 1.7–2.4)

## 2019-05-09 MED ORDER — IVERMECTIN 3 MG PO TABS
200.0000 ug/kg | ORAL_TABLET | ORAL | Status: AC
  Administered 2019-05-11: 09:00:00 18000 ug via ORAL
  Filled 2019-05-09 (×2): qty 6

## 2019-05-09 MED ORDER — MELATONIN 5 MG PO TABS
10.0000 mg | ORAL_TABLET | Freq: Every day | ORAL | Status: DC
  Administered 2019-05-09 – 2019-05-12 (×4): 10 mg via ORAL
  Filled 2019-05-09 (×5): qty 2

## 2019-05-09 MED ORDER — FAMOTIDINE 20 MG PO TABS
40.0000 mg | ORAL_TABLET | Freq: Every day | ORAL | Status: DC
  Administered 2019-05-10 – 2019-05-13 (×4): 40 mg via ORAL
  Filled 2019-05-09 (×4): qty 2

## 2019-05-09 MED ORDER — PANTOPRAZOLE SODIUM 40 MG PO TBEC
40.0000 mg | DELAYED_RELEASE_TABLET | Freq: Every day | ORAL | Status: DC
  Administered 2019-05-10: 10:00:00 40 mg via ORAL
  Filled 2019-05-09: qty 1

## 2019-05-09 MED ORDER — METHYLPREDNISOLONE SODIUM SUCC 40 MG IJ SOLR
40.0000 mg | Freq: Two times a day (BID) | INTRAMUSCULAR | Status: DC
  Administered 2019-05-10 – 2019-05-13 (×7): 40 mg via INTRAVENOUS
  Filled 2019-05-09 (×7): qty 1

## 2019-05-09 MED ORDER — THIAMINE HCL 100 MG/ML IJ SOLN
200.0000 mg | Freq: Two times a day (BID) | INTRAMUSCULAR | Status: DC
  Administered 2019-05-10: 10:00:00 200 mg via INTRAVENOUS
  Filled 2019-05-09: qty 2

## 2019-05-09 MED ORDER — CICLESONIDE 160 MCG/ACT IN AERS
1.0000 | INHALATION_SPRAY | Freq: Two times a day (BID) | RESPIRATORY_TRACT | Status: DC
  Filled 2019-05-09 (×2): qty 6.1

## 2019-05-09 MED ORDER — CHOLECALCIFEROL 10 MCG (400 UNIT) PO TABS
1000.0000 [IU] | ORAL_TABLET | Freq: Every day | ORAL | Status: DC
  Administered 2019-05-10 – 2019-05-13 (×4): 1000 [IU] via ORAL
  Filled 2019-05-09 (×5): qty 3

## 2019-05-09 MED ORDER — ASCORBIC ACID 500 MG PO TABS
500.0000 mg | ORAL_TABLET | Freq: Two times a day (BID) | ORAL | Status: DC
  Administered 2019-05-10 – 2019-05-13 (×7): 500 mg via ORAL
  Filled 2019-05-09 (×7): qty 1

## 2019-05-09 MED ORDER — INSULIN DETEMIR 100 UNIT/ML ~~LOC~~ SOLN
8.0000 [IU] | Freq: Two times a day (BID) | SUBCUTANEOUS | Status: DC
  Administered 2019-05-09 – 2019-05-10 (×2): 8 [IU] via SUBCUTANEOUS
  Filled 2019-05-09 (×3): qty 0.08

## 2019-05-09 MED ORDER — ZINC SULFATE 220 (50 ZN) MG PO CAPS
220.0000 mg | ORAL_CAPSULE | Freq: Every day | ORAL | Status: DC
  Administered 2019-05-10 – 2019-05-13 (×4): 220 mg via ORAL
  Filled 2019-05-09 (×4): qty 1

## 2019-05-09 MED ORDER — ASPIRIN 81 MG PO CHEW
81.0000 mg | CHEWABLE_TABLET | Freq: Every day | ORAL | Status: DC
  Administered 2019-05-10 – 2019-05-13 (×4): 81 mg via ORAL
  Filled 2019-05-09 (×4): qty 1

## 2019-05-09 MED ORDER — INSULIN ASPART 100 UNIT/ML ~~LOC~~ SOLN
0.0000 [IU] | Freq: Three times a day (TID) | SUBCUTANEOUS | Status: DC
  Administered 2019-05-10: 7 [IU] via SUBCUTANEOUS
  Administered 2019-05-10 (×2): 20 [IU] via SUBCUTANEOUS
  Administered 2019-05-11: 17:00:00 11 [IU] via SUBCUTANEOUS
  Administered 2019-05-11: 09:00:00 3 [IU] via SUBCUTANEOUS
  Administered 2019-05-11: 13:00:00 20 [IU] via SUBCUTANEOUS
  Administered 2019-05-12 (×2): 4 [IU] via SUBCUTANEOUS
  Administered 2019-05-12: 3 [IU] via SUBCUTANEOUS
  Filled 2019-05-09 (×9): qty 1

## 2019-05-09 MED ORDER — SODIUM CHLORIDE 0.9 % IV SOLN
100.0000 mg | Freq: Two times a day (BID) | INTRAVENOUS | Status: DC
  Administered 2019-05-10 – 2019-05-13 (×7): 100 mg via INTRAVENOUS
  Filled 2019-05-09 (×9): qty 100

## 2019-05-09 MED ORDER — LEVALBUTEROL TARTRATE 45 MCG/ACT IN AERO
2.0000 | INHALATION_SPRAY | Freq: Four times a day (QID) | RESPIRATORY_TRACT | Status: DC | PRN
  Administered 2019-05-09: 21:00:00 2 via RESPIRATORY_TRACT
  Filled 2019-05-09: qty 15

## 2019-05-09 NOTE — Progress Notes (Signed)
Inpatient Diabetes Program Recommendations  AACE/ADA: New Consensus Statement on Inpatient Glycemic Control (2015)  Target Ranges:  Prepandial:   less than 140 mg/dL      Peak postprandial:   less than 180 mg/dL (1-2 hours)      Critically ill patients:  140 - 180 mg/dL   Lab Results  Component Value Date   GLUCAP 241 (H) 05/09/2019   HGBA1C 7.6 (H) 05/05/2019    Review of Glycemic Control Results for SHIRI, HODAPP (MRN [id number]) as of 05/09/2019 12:09  Ref. Range 05/08/2019 07:38 05/08/2019 11:03 05/08/2019 16:19 05/08/2019 22:12 05/09/2019 08:35  Glucose-Capillary Latest Ref Range: 70 - 99 mg/dL 953 (H) 967 (H) 289 (H) 330 (H) 241 (H)   Diabetes history: None noted- A1C=7.6# Outpatient Diabetes medications: None listed Current orders for Inpatient glycemic control:  Novolog sensitive tid with meals and HS Decadron 6 mg IV q 24 hours  Inpatient Diabetes Program Recommendations:    Please consider adding Levemir 8 units bid.  Also please increase Novolog correction to resistant while on steroids. A1C indicates DM, however note that patient was on steroids prior to admit which may have contributed to elevated blood sugars.   Thanks  Beryl Meager, RN, BC-ADM Inpatient Diabetes Coordinator Pager [phone] (8a-5p)

## 2019-05-09 NOTE — Clinical Social Work Note (Signed)
CSW acknowledges consult that patient will discharge on Lovenox. No TOC needs at this time.  Charlynn Court, CSW [phone]

## 2019-05-09 NOTE — Progress Notes (Addendum)
Name: Deborah Miller MRN: [id number] DOB: 04-07-1956    ADMISSION DATE:  05/05/2019 CONSULTATION DATE: 05/05/2018  REFERRING MD : Dr. Georgeann Oppenheim   FOLLOW UP: Acute respiratory failure with hypoxia  BRIEF PATIENT DESCRIPTION:  64 yo female initially diagnosed with COVID-19 in the outpatient setting on 12/23 currently admitted with acute hypoxic respiratory failure secondary to COVID-19 pneumonia requiring HFNC and NRB  SIGNIFICANT EVENTS/STUDIES:  12/31: Pt presented to the ER from PCP with worsening acute hypoxic respiratory failure secondary to COVID-19 pneumonia 01/1: Pt admitted to ICU on Bipap.  Later transitioned off Bipap to HFNC and NRB 01/3: severe hypoxia, high risk for intubation and death, subcu emphysema noted 2022/06/03: No progress noted, started ivermectin protocol   Subjective Continues with requirement of high flow O2 with intermittent supplementation with 100% nonrebreather mask.  Not making progress with remdesivir and usual protocol. Limited subjective input as she is very dyspneic.   EXAM:   COVID-19 DISASTER DECLARATION:   FULL CONTACT PHYSICAL EXAMINATION WAS NOT POSSIBLE DUE TO TREATMENT OF COVID-19 AND   CONSERVATION OF PERSONAL PROTECTIVE EQUIPMENT, LIMITED EXAM FINDINGS INCLUDE-   Patient assessed or the symptoms described in the history of present illness.   In the context of the Global COVID-19 pandemic, which necessitated consideration that the patient might be at risk for infection with the SARS-CoV-2 virus that causes COVID-19, Institutional protocols and algorithms that pertain to the evaluation of patients at risk for COVID-19 are in a state of rapid change based on information released by regulatory bodies including the CDC and federal and state organizations. These policies and algorithms were followed during the patient's care while in hospital.   Tachypneic,no overt distress though mentating well.  Unable to auscultate well due to CAPR.  VITAL  SIGNS: Temp:  [97.6 F (36.4 C)-98.7 F (37.1 C)] 98.7 F (37.1 C) June 03, 2022 2000) Pulse Rate:  [66-104] 97 2022/06/03 2100) Resp:  [19-32] 21 06/03/2022 2100) BP: (118-155)/(76-135) 144/133 03-Jun-2022 2100) SpO2:  [68 %-95 %] 68 % 03-Jun-2022 2100) FiO2 (%):  [90 %-100 %] 90 % Jun 03, 2022 2016)    Recent Labs  Lab 05/07/19 0514 05/08/19 0418 04-Jun-2019 0546  NA 142 139 137  K 3.8 4.0 4.3  CL 99 97* 93*  CO2 31 31 32  BUN 31* 36* 42*  CREATININE 0.90 0.76 0.81  GLUCOSE 251* 242* 252*   Recent Labs  Lab 05/07/19 0514 05/08/19 0418 Jun 04, 2019 0546  HGB 13.7 12.8 13.7  HCT 44.1 38.8 41.5  WBC 14.8* 12.9* 14.7*  PLT 276 242 238   DG Chest Port 1 View  Result Date: 05/08/2019 CLINICAL DATA:  COVID 19 positive, shortness of breath EXAM: PORTABLE CHEST 1 VIEW COMPARISON:  05/05/2019 FINDINGS: Stable cardiomediastinal contours. Patchy bilateral airspace opacities, which appears slightly more confluent within the right lung base. No discernible pleural effusion or pneumothorax. Extensive subcutaneous emphysema within the chest wall and right neck. IMPRESSION: 1. Patchy bilateral airspace opacities, which appears slightly more confluent within the right lung base. 2. Extensive subcutaneous emphysema within the chest wall and right neck. No discernible pneumothorax or pleural effusion. Electronically Signed   By: Duanne Guess D.O.   On: 05/08/2019 13:15    ASSESSMENT / PLAN:  Acute hypoxic respiratory failure secondary to COVID-19 late pulmonary phase Hx: Asthma, Cor Pulmonale, OSA, and Morbid Obesity  Supplemental O2 to maintain O2 sats 85% or above  Continue remdesivir, and vitamins  Xopenex inhaler as needed Ciclesonide inhaler 4 inhalations twice a day Ivermectin,doxycycline,solumedrol Trend  inflammatory markers  Trend WBC and monitor fever curve Follow cultures  OOB to chair as tolerated  Pulmonary hygiene/IS while awake Continue proning as tolerated due to severe hypoxia   Maintain airborne  and contact precautions  Continuous telemetry monitoring   Borderline diabetes mellitus  CBG ac/hs SSI  Add baseline Lantus  Anxiety Continue outpatient xanax   Best Practice: VTE px: subq lovenox SUP px: Famotidine  Diet: heart healthy   Multidisciplinary rounds were performed, patient's management discussed.  Overall, patient is critically ill, prognosis is guarded.  At risk for further deterioration and organ failure.  At high risk for intubation.   Renold Don, MD [place]  This note was dictated using voice recognition software/Dragon.  Despite best efforts to proofread, errors can occur which can change the meaning.  Any change was purely unintentional.

## 2019-05-10 LAB — COMPREHENSIVE METABOLIC PANEL
ALT: 25 U/L (ref 0–44)
AST: 27 U/L (ref 15–41)
Albumin: 2.7 g/dL — ABNORMAL LOW (ref 3.5–5.0)
Alkaline Phosphatase: 107 U/L (ref 38–126)
Anion gap: 14 (ref 5–15)
BUN: 32 mg/dL — ABNORMAL HIGH (ref 8–23)
CO2: 28 mmol/L (ref 22–32)
Calcium: 8.6 mg/dL — ABNORMAL LOW (ref 8.9–10.3)
Chloride: 95 mmol/L — ABNORMAL LOW (ref 98–111)
Creatinine, Ser: 0.63 mg/dL (ref 0.44–1.00)
GFR calc Af Amer: >60 mL/min (ref >60)
GFR calc non Af Amer: >60 mL/min (ref >60)
Glucose, Bld: 251 mg/dL — ABNORMAL HIGH (ref 70–99)
Potassium: 4 mmol/L (ref 3.5–5.1)
Sodium: 137 mmol/L (ref 135–145)
Total Bilirubin: 0.8 mg/dL (ref 0.3–1.2)
Total Protein: 6.5 g/dL (ref 6.5–8.1)

## 2019-05-10 LAB — GLUCOSE, CAPILLARY
Glucose-Capillary: 227 mg/dL — ABNORMAL HIGH (ref 70–99)
Glucose-Capillary: 403 mg/dL — ABNORMAL HIGH (ref 70–99)
Glucose-Capillary: 387 mg/dL — ABNORMAL HIGH (ref 70–99)
Glucose-Capillary: 372 mg/dL — ABNORMAL HIGH (ref 70–99)

## 2019-05-10 LAB — FERRITIN: Ferritin: 171 ng/mL (ref 11–307)

## 2019-05-10 LAB — CBC WITH DIFFERENTIAL/PLATELET
Abs Immature Granulocytes: 0.19 10*3/uL — ABNORMAL HIGH (ref 0.00–0.07)
Basophils Absolute: 0 10*3/uL (ref 0.0–0.1)
Basophils Relative: 0 %
Eosinophils Absolute: 0 10*3/uL (ref 0.0–0.5)
Eosinophils Relative: 0 %
HCT: 44.3 % (ref 36.0–46.0)
Hemoglobin: 14.1 g/dL (ref 12.0–15.0)
Immature Granulocytes: 1 %
Lymphocytes Relative: 3 %
Lymphs Abs: 0.7 10*3/uL (ref 0.7–4.0)
MCH: 25.4 pg — ABNORMAL LOW (ref 26.0–34.0)
MCHC: 31.8 g/dL (ref 30.0–36.0)
MCV: 79.7 fL — ABNORMAL LOW (ref 80.0–100.0)
Monocytes Absolute: 0.6 10*3/uL (ref 0.1–1.0)
Monocytes Relative: 3 %
Neutro Abs: 20 10*3/uL — ABNORMAL HIGH (ref 1.7–7.7)
Neutrophils Relative %: 93 %
Platelets: 253 10*3/uL (ref 150–400)
RBC: 5.56 MIL/uL — ABNORMAL HIGH (ref 3.87–5.11)
RDW: 14.4 % (ref 11.5–15.5)
WBC: 21.5 10*3/uL — ABNORMAL HIGH (ref 4.0–10.5)
nRBC: 0 % (ref 0.0–0.2)

## 2019-05-10 LAB — CULTURE, BLOOD (ROUTINE X 2)
Culture: NO GROWTH
Culture: NO GROWTH
Special Requests: ADEQUATE
Special Requests: ADEQUATE

## 2019-05-10 LAB — D-DIMER, QUANTITATIVE: D-Dimer, Quant: 3.7 ug/mL-FEU — ABNORMAL HIGH (ref 0.00–0.50)

## 2019-05-10 LAB — BRAIN NATRIURETIC PEPTIDE: B Natriuretic Peptide: 76 pg/mL (ref 0.0–100.0)

## 2019-05-10 LAB — MAGNESIUM: Magnesium: 2.3 mg/dL (ref 1.7–2.4)

## 2019-05-10 LAB — C-REACTIVE PROTEIN: CRP: 5.1 mg/dL — ABNORMAL HIGH (ref <1.0)

## 2019-05-10 MED ORDER — FUROSEMIDE 10 MG/ML IJ SOLN
40.0000 mg | Freq: Once | INTRAMUSCULAR | Status: AC
  Administered 2019-05-10: 40 mg via INTRAVENOUS
  Filled 2019-05-10: qty 4

## 2019-05-10 MED ORDER — BISACODYL 10 MG RE SUPP
10.0000 mg | Freq: Once | RECTAL | Status: AC
  Administered 2019-05-10: 17:00:00 10 mg via RECTAL

## 2019-05-10 MED ORDER — SENNOSIDES-DOCUSATE SODIUM 8.6-50 MG PO TABS
1.0000 | ORAL_TABLET | Freq: Two times a day (BID) | ORAL | Status: DC
  Administered 2019-05-11 – 2019-05-12 (×4): 1 via ORAL
  Filled 2019-05-10 (×4): qty 1

## 2019-05-10 MED ORDER — INSULIN ASPART 100 UNIT/ML ~~LOC~~ SOLN
5.0000 [IU] | Freq: Three times a day (TID) | SUBCUTANEOUS | Status: DC
  Administered 2019-05-10 – 2019-05-11 (×3): 5 [IU] via SUBCUTANEOUS
  Filled 2019-05-10: qty 1

## 2019-05-10 MED ORDER — THIAMINE HCL 100 MG/ML IJ SOLN
200.0000 mg | Freq: Two times a day (BID) | INTRAMUSCULAR | Status: AC
  Administered 2019-05-10 – 2019-05-12 (×5): 200 mg via INTRAVENOUS
  Filled 2019-05-10 (×5): qty 2

## 2019-05-10 MED ORDER — INSULIN DETEMIR 100 UNIT/ML ~~LOC~~ SOLN
15.0000 [IU] | Freq: Two times a day (BID) | SUBCUTANEOUS | Status: DC
  Administered 2019-05-10 – 2019-05-11 (×3): 15 [IU] via SUBCUTANEOUS
  Administered 2019-05-12: 22:00:00 7 [IU] via SUBCUTANEOUS
  Administered 2019-05-12: 15 [IU] via SUBCUTANEOUS
  Filled 2019-05-10 (×7): qty 0.15

## 2019-05-10 MED ORDER — IVERMECTIN 3 MG PO TABS
200.0000 ug/kg | ORAL_TABLET | Freq: Once | ORAL | Status: AC
  Administered 2019-05-10: 21:00:00 18000 ug via ORAL
  Filled 2019-05-10: qty 6

## 2019-05-10 MED ORDER — CICLESONIDE 80 MCG/ACT IN AERS
2.0000 | INHALATION_SPRAY | Freq: Two times a day (BID) | RESPIRATORY_TRACT | Status: DC
  Administered 2019-05-10 – 2019-05-12 (×5): 2 via RESPIRATORY_TRACT
  Filled 2019-05-10: qty 2

## 2019-05-10 MED ORDER — THIAMINE HCL 100 MG PO TABS
200.0000 mg | ORAL_TABLET | Freq: Every day | ORAL | Status: DC
  Administered 2019-05-13: 09:00:00 200 mg via ORAL
  Filled 2019-05-10: qty 2

## 2019-05-10 MED ORDER — THIAMINE HCL 100 MG PO TABS: 200.0000 mg | ORAL_TABLET | Freq: Every day | ORAL | Status: DC

## 2019-05-10 NOTE — Progress Notes (Signed)
Inpatient Diabetes Program Recommendations  AACE/ADA: New Consensus Statement on Inpatient Glycemic Control (2015)  Target Ranges:  Prepandial:   less than 140 mg/dL      Peak postprandial:   less than 180 mg/dL (1-2 hours)      Critically ill patients:  140 - 180 mg/dL   Lab Results  Component Value Date   GLUCAP 403 (H) 05/10/2019   HGBA1C 7.6 (H) 05/05/2019    Review of Glycemic Control Results for Deborah Miller, Deborah Miller (MRN [id number]) as of 05/10/2019 13:17  Ref. Range 05/09/2019 15:37 05/09/2019 16:35 05/09/2019 21:19 05/10/2019 09:11 05/10/2019 11:34  Glucose-Capillary Latest Ref Range: 70 - 99 mg/dL 068 (H) 166 (H) 196 (H) 227 (H) 403 (H)  Diabetes history: None noted- A1C=7.6# Outpatient Diabetes medications: None listed Current orders for Inpatient glycemic control:  Novolog resistant tid with meals and HS Levemir 15 units bid Solumedrol 40 mg IV q 12 hours Inpatient Diabetes Program Recommendations:   Please consider adding Novolog meal coverage 5 units tid with meals (hold if patient eats less than 50%).    Thanks, Beryl Meager, RN, BC-ADM Inpatient Diabetes Coordinator Pager [phone] (8a-5p)

## 2019-05-10 NOTE — Progress Notes (Addendum)
Name: Deborah Miller MRN: [id number] DOB: 08/30/1955    ADMISSION DATE:  05/05/2019 CONSULTATION DATE: 05/05/2018  REFERRING MD : Dr. Priscella Mann   FOLLOW UP: Acute respiratory failure with hypoxia  BRIEF PATIENT DESCRIPTION:  64 yo female initially diagnosed with COVID-19 in the outpatient setting on 12/23 currently admitted with acute hypoxic respiratory failure secondary to COVID-19 pneumonia requiring HFNC and NRB.  SIGNIFICANT EVENTS/STUDIES:  12/31: Pt presented to the ER from PCP with worsening acute hypoxic respiratory failure secondary to COVID-19 pneumonia 01/1: Pt admitted to ICU on [place].  Later transitioned off Bipap to HFNC and NRB 01/3: severe hypoxia, high risk for intubation and death, subcu emphysema noted, no pneumothorax 01/4: No progress noted, started ivermectin protocol, persistent subcu emphysema 01/5: Adjusted Levemir to twice a day, continues with high oxygen requirements however does not desaturate as easily   Subjective Continues with requirement of high flow O2 with intermittent supplementation with 100% nonrebreather mask.  She states she continues to feel better, insists she is not not as air hungry.   EXAM:  COVID-19 DISASTER DECLARATION:   FULL CONTACT PHYSICAL EXAMINATION WAS NOT POSSIBLE DUE TO TREATMENT OF COVID-19 AND   CONSERVATION OF PERSONAL PROTECTIVE EQUIPMENT, LIMITED EXAM FINDINGS INCLUDE-   Patient assessed or the symptoms described in the history of present illness.   In the context of the Global COVID-19 pandemic, which necessitated consideration that the patient might be at risk for infection with the SARS-CoV-2 virus that causes COVID-19, Institutional protocols and algorithms that pertain to the evaluation of patients at risk for COVID-19 are in a state of rapid change based on information released by regulatory bodies including the CDC and federal and state organizations. These policies and algorithms were followed during the  patient's care while in hospital.   Tachypneic,no overt distress though mentating well.  Unable to auscultate well due to CAPR.  VITAL SIGNS: Temp:  [98.2 F (36.8 C)-99.5 F (37.5 C)] 99.5 F (37.5 C) (01/05 1301) Pulse Rate:  [70-125] 70 (01/05 1800) Resp:  [14-33] 24 (01/05 1800) BP: (91-167)/(72-117) 160/107 (01/05 1800) SpO2:  [67 %-94 %] 67 % (01/05 1800) FiO2 (%):  [100 %] 100 % (01/05 1356)    Recent Labs  Lab 05/08/19 0418 05/09/19 0546 05/10/19 0838  NA 139 137 137  K 4.0 4.3 4.0  CL 97* 93* 95*  CO2 31 32 28  BUN 36* 42* 32*  CREATININE 0.76 0.81 0.63  GLUCOSE 242* 252* 251*   Recent Labs  Lab 05/08/19 0418 05/09/19 0546 05/10/19 0838  HGB 12.8 13.7 14.1  HCT 38.8 41.5 44.3  WBC 12.9* 14.7* 21.5*  PLT 242 238 253   No results found.  ASSESSMENT / PLAN:  Acute hypoxic respiratory failure secondary to COVID-19 late pulmonary phase. Hx: Asthma, Cor Pulmonale, OSA, and Morbid Obesity  Supplemental O2 to maintain O2 sats 85% or above  Finished remdesivir, continue vitamins  Xopenex inhaler as needed Ciclesonide inhaler 4 inhalations twice a day Continue Ivermectin,doxycycline,solumedrol Trend inflammatory markers  Trend WBC and monitor fever curve Follow cultures  OOB to chair as tolerated  Pulmonary hygiene/IS while awake Continue proning as tolerated due to severe hypoxia   Maintain airborne and contact precautions  Continuous telemetry monitoring   Borderline diabetes mellitus  CBG ac/hs SSI  Add baseline Levemir  Anxiety Continue outpatient xanax   Best Practice: VTE px: subq lovenox SUP px: Famotidine  Diet: heart healthy   Multidisciplinary rounds were performed, patient's management discussed.  Overall,  patient is critically ill, prognosis is guarded.  At risk for further deterioration and organ failure.  Very high risk for intubation  C. Danice Goltz, MD [place]  This note was dictated using voice recognition  software/Dragon.  Despite best efforts to proofread, errors can occur which can change the meaning.  Any change was purely unintentional.

## 2019-05-11 LAB — CBC
HCT: 48.1 % — ABNORMAL HIGH (ref 36.0–46.0)
Hemoglobin: 15.1 g/dL — ABNORMAL HIGH (ref 12.0–15.0)
MCH: 25.3 pg — ABNORMAL LOW (ref 26.0–34.0)
MCHC: 31.4 g/dL (ref 30.0–36.0)
MCV: 80.4 fL (ref 80.0–100.0)
Platelets: 291 10*3/uL (ref 150–400)
RBC: 5.98 MIL/uL — ABNORMAL HIGH (ref 3.87–5.11)
RDW: 14.6 % (ref 11.5–15.5)
WBC: 20.5 10*3/uL — ABNORMAL HIGH (ref 4.0–10.5)
nRBC: 0 % (ref 0.0–0.2)

## 2019-05-11 LAB — BASIC METABOLIC PANEL
Anion gap: 12 (ref 5–15)
BUN: 31 mg/dL — ABNORMAL HIGH (ref 8–23)
CO2: 27 mmol/L (ref 22–32)
Calcium: 8.6 mg/dL — ABNORMAL LOW (ref 8.9–10.3)
Chloride: 96 mmol/L — ABNORMAL LOW (ref 98–111)
Creatinine, Ser: 0.66 mg/dL (ref 0.44–1.00)
GFR calc Af Amer: >60 mL/min (ref >60)
GFR calc non Af Amer: >60 mL/min (ref >60)
Glucose, Bld: 121 mg/dL — ABNORMAL HIGH (ref 70–99)
Potassium: 3.7 mmol/L (ref 3.5–5.1)
Sodium: 135 mmol/L (ref 135–145)

## 2019-05-11 LAB — C-REACTIVE PROTEIN: CRP: 5.5 mg/dL — ABNORMAL HIGH (ref <1.0)

## 2019-05-11 LAB — GLUCOSE, CAPILLARY
Glucose-Capillary: 123 mg/dL — ABNORMAL HIGH (ref 70–99)
Glucose-Capillary: 362 mg/dL — ABNORMAL HIGH (ref 70–99)
Glucose-Capillary: 272 mg/dL — ABNORMAL HIGH (ref 70–99)
Glucose-Capillary: 248 mg/dL — ABNORMAL HIGH (ref 70–99)

## 2019-05-11 LAB — FIBRIN DERIVATIVES D-DIMER (ARMC ONLY): Fibrin derivatives D-dimer (ARMC): 3578.86 ng/mL (FEU) — ABNORMAL HIGH (ref 0.00–499.00)

## 2019-05-11 LAB — MAGNESIUM: Magnesium: 2.1 mg/dL (ref 1.7–2.4)

## 2019-05-11 MED ORDER — INSULIN ASPART 100 UNIT/ML ~~LOC~~ SOLN
10.0000 [IU] | Freq: Three times a day (TID) | SUBCUTANEOUS | Status: DC
  Administered 2019-05-11 – 2019-05-12 (×4): 10 [IU] via SUBCUTANEOUS
  Filled 2019-05-11 (×3): qty 1

## 2019-05-11 NOTE — Progress Notes (Signed)
ANTICOAGULATION CONSULT NOTE  Pharmacy Consult for Enoxaparin Indication: DVT  Patient Measurements: Height: 4\' 7"  (139.7 cm) Weight: 196 lb 6.9 oz (89.1 kg) IBW/kg (Calculated) : 34  Vital Signs: Temp: 97 F (36.1 C) (01/06 0900) Temp Source: Axillary (01/06 0900) BP: 154/81 (01/06 1240) Pulse Rate: 127 (01/06 1240)  Labs: Recent Labs    05/09/19 0546 05/10/19 0838 05/11/19 0808  HGB 13.7 14.1 15.1*  HCT 41.5 44.3 48.1*  PLT 238 253 291  CREATININE 0.81 0.63 0.66    Estimated Creatinine Clearance: 63.6 mL/min (by C-G formula based on SCr of 0.66 mg/dL).   Medical History: Past Medical History:  Diagnosis Date  . Acid reflux   . Allergic genetic state   . Anxiety   . Arthritis   . Asthma 1957  . Breast microcalcification, mammographic 2014  . Breast screening, unspecified   . Carpal tunnel syndrome 1999   repaired left wrist  . Cor pulmonale (HCC)   . DDD (degenerative disc disease), lumbar   . Depression   . Diabetes mellitus without complication (HCC) 2019   borderline, follows diabetic diet often  . Environmental allergies   . Esophageal spasm   . Fecal smearing   . Headache    sinus migraines. takes OTC meds for allergies  . Heart murmur    as a child, not treated  . Hemorrhoids 2012  . History of hiatal hernia   . Hypertension   . Lump in thyroid 2019   having an ultrasound 10/2017 to rule out tumor or salivary gland enlargement  . Morbid obesity (HCC)   . Osteoarthritis of spine with radiculopathy, lumbar region   . Other dysphagia   . Pain    chronic lbp,stenosis  . PONV (postoperative nausea and vomiting)    nausea and vomitting due to morphine  . Primary osteoarthritis of left knee   . Primary osteoarthritis of right hip   . Right thyroid nodule   . Shortness of breath dyspnea    increased with anxiety  . Sleep apnea    cpap   Assessment: Patient is a 64yo female being treated for Covid. Now with new DVT. Venous ultrasound revealed  an occlusive DVT involving the left peroneal vein  Pharmacy was consulted for Enoxaparin dosing. Pt has a listed pork allergy, no reaction noted as of yet. Continue to monitor. Renal function has been stable with no noted bleeding or bruising. H&H, platelets wnl and stable   Plan:   Continue enoxaparin 90mg  (1mg /kg) SQ q12h  BMP, CBC in am  64yo, PharmD, BCPS 05/11/2019 12:57 PM

## 2019-05-11 NOTE — Progress Notes (Signed)
Inpatient Diabetes Program Recommendations  AACE/ADA: New Consensus Statement on Inpatient Glycemic Control (2015)  Target Ranges:  Prepandial:   less than 140 mg/dL      Peak postprandial:   less than 180 mg/dL (1-2 hours)      Critically ill patients:  140 - 180 mg/dL   Lab Results  Component Value Date   GLUCAP 362 (H) 05/11/2019   HGBA1C 7.6 (H) 05/05/2019    Review of Glycemic Control  Results for Deborah Miller, Deborah Miller (MRN [id number]) as of 05/11/2019 13:17  Ref. Range 05/11/2019 08:59 05/11/2019 11:18 05/11/2019 12:32  Glucose-Capillary Latest Ref Range: 70 - 99 mg/dL 415 (H)  NOVOLOG 8 units  362 (H)    Diabetes history: None noted Outpatient Diabetes medications: None Current orders for Inpatient glycemic control: Novolog 0-20 TID + 0-5 QHS; Novolog 5 units TID with meals; Levemir 15 units BID  Inpatient Diabetes Program Recommendations:     -Please consider Novolog 10 units TID with meals if eats at least 50% of meal  Thank you, Zerita Boers, RN, BSN Diabetes Coordinator Inpatient Diabetes Program [phone] (team pager from 8a-5p)

## 2019-05-11 NOTE — Progress Notes (Signed)
Name: Deborah Miller MRN: [id number] DOB: [birth year]    ADMISSION DATE:  05/05/2019 INITIAL CONSULTATION DATE: 05/05/2018  REFERRING MD : Dr. Priscella Mann   FOLLOW UP: Acute respiratory failure with hypoxia  BRIEF PATIENT DESCRIPTION:  64 yo female initially diagnosed with COVID-19 in the outpatient setting on 12/23 currently admitted with acute hypoxic respiratory failure secondary to COVID-19 pneumonia requiring HFNC and NRB.  SIGNIFICANT EVENTS/STUDIES:  12/31: Pt presented to the ER from PCP with worsening acute hypoxic respiratory failure secondary to COVID-19 pneumonia 01/1: Pt admitted to ICU on [place].  Later transitioned off Bipap to HFNC and NRB 01/3: severe hypoxia, high risk for intubation and death, subcu emphysema developing 01/4: No progress noted, started ivermectin protocol for late pulmonary phase COVID-19 01/5: Adjusted Levemir to twice a day, continues with high oxygen requirements however does not desaturate as easily   Subjective Continues with requirement of high flow O2 with intermittent supplementation with 100% nonrebreather mask.  She states she feels somewhat better, not as air hungry.  Still with significant subcu emphysema but not as marked today.   EXAM:  COVID-19 DISASTER DECLARATION:   FULL CONTACT PHYSICAL EXAMINATION WAS NOT POSSIBLE DUE TO TREATMENT OF COVID-19 AND   CONSERVATION OF PERSONAL PROTECTIVE EQUIPMENT, LIMITED EXAM FINDINGS INCLUDE-   Patient assessed or the symptoms described in the history of present illness.   In the context of the Global COVID-19 pandemic, which necessitated consideration that the patient might be at risk for infection with the SARS-CoV-2 virus that causes COVID-19, Institutional protocols and algorithms that pertain to the evaluation of patients at risk for COVID-19 are in a state of rapid change based on information released by regulatory bodies including the CDC and federal and state organizations. These policies  and algorithms were followed during the patient's care while in hospital.   Tachypneic,no overt distress though mentating well.  Unable to auscultate well due to CAPR.  She has significant subcu emphysema.  Does not appear as distressed despite mild tachypnea.    VITAL SIGNS: Temp:  [97 F (36.1 C)-99.2 F (37.3 C)] 98.2 F (36.8 C) (01/06 1900) Pulse Rate:  [93-134] 115 (01/06 2100) Resp:  [12-36] 32 (01/06 2100) BP: (122-175)/(73-103) 154/87 (01/06 2100) SpO2:  [87 %-98 %] 97 % (01/06 2100) FiO2 (%):  [100 %] 100 % (01/06 1354)    Recent Labs  Lab 05/09/19 0546 05/10/19 0838 05/11/19 0808  NA 137 137 135  K 4.3 4.0 3.7  CL 93* 95* 96*  CO2 32 28 27  BUN 42* 32* 31*  CREATININE 0.81 0.63 0.66  GLUCOSE 252* 251* 121*   Recent Labs  Lab 05/09/19 0546 05/10/19 0838 05/11/19 0808  HGB 13.7 14.1 15.1*  HCT 41.5 44.3 48.1*  WBC 14.7* 21.5* 20.5*  PLT 238 253 291   No results found.  ASSESSMENT / PLAN:  Acute hypoxic respiratory failure secondary to COVID-19 with late pulmonary phase Hx: Asthma, Cor Pulmonale, OSA, and Morbid Obesity  Supplemental O2 to maintain O2 sats 85% or above  Finished remdesivir, continue vitamins  Xopenex inhaler as needed Ciclesonide inhaler 4 inhalations twice a day Continue Ivermectin (late pulmonary phase),doxycycline,solumedrol Trend inflammatory markers  Trend WBC and monitor fever curve Follow cultures  OOB to chair as tolerated  Pulmonary hygiene/IS while awake Continue proning as tolerated due to severe hypoxia   Maintain airborne and contact precautions  Continuous telemetry monitoring Monitor subcu emphysema.  No overt pneumothorax on x-ray.  Borderline diabetes mellitus  CBG ac/hs  SSI  Add baseline Levemir  Anxiety Continue outpatient xanax   Best Practice: VTE px: subq lovenox SUP px: Famotidine  Diet: heart healthy   Multidisciplinary rounds were performed, patient's management discussed.  Overall,  patient is critically ill, prognosis is guarded.  At risk for further deterioration and organ failure.  At risk for intubation, patient wants to withhold for now.   Gailen Shelter, MD [facility]  This note was dictated using voice recognition software/Dragon.  Despite best efforts to proofread, errors can occur which can change the meaning.  Any change was purely unintentional.

## 2019-05-12 ENCOUNTER — Inpatient Hospital Stay: Payer: BC Managed Care – PPO

## 2019-05-12 LAB — CBC
HCT: 43 % (ref 36.0–46.0)
Hemoglobin: 13.8 g/dL (ref 12.0–15.0)
MCH: 25.3 pg — ABNORMAL LOW (ref 26.0–34.0)
MCHC: 32.1 g/dL (ref 30.0–36.0)
MCV: 78.9 fL — ABNORMAL LOW (ref 80.0–100.0)
Platelets: 281 10*3/uL (ref 150–400)
RBC: 5.45 MIL/uL — ABNORMAL HIGH (ref 3.87–5.11)
RDW: 14.6 % (ref 11.5–15.5)
WBC: 18.4 10*3/uL — ABNORMAL HIGH (ref 4.0–10.5)
nRBC: 0 % (ref 0.0–0.2)

## 2019-05-12 LAB — TRIGLYCERIDES: Triglycerides: 179 mg/dL — ABNORMAL HIGH (ref <150)

## 2019-05-12 LAB — BASIC METABOLIC PANEL
Anion gap: 9 (ref 5–15)
BUN: 30 mg/dL — ABNORMAL HIGH (ref 8–23)
CO2: 28 mmol/L (ref 22–32)
Calcium: 8.5 mg/dL — ABNORMAL LOW (ref 8.9–10.3)
Chloride: 96 mmol/L — ABNORMAL LOW (ref 98–111)
Creatinine, Ser: 0.6 mg/dL (ref 0.44–1.00)
GFR calc Af Amer: >60 mL/min (ref >60)
GFR calc non Af Amer: >60 mL/min (ref >60)
Glucose, Bld: 151 mg/dL — ABNORMAL HIGH (ref 70–99)
Potassium: 3.8 mmol/L (ref 3.5–5.1)
Sodium: 133 mmol/L — ABNORMAL LOW (ref 135–145)

## 2019-05-12 LAB — BLOOD GAS, ARTERIAL
Acid-Base Excess: 8.6 mmol/L — ABNORMAL HIGH (ref 0.0–2.0)
Bicarbonate: 33.5 mmol/L — ABNORMAL HIGH (ref 20.0–28.0)
FIO2: 1
MECHVT: 400 mL
O2 Saturation: 99.4 %
PEEP: 10 cmH2O
Patient temperature: 37
RATE: 16 resp/min
pCO2 arterial: 46 mmHg (ref 32.0–48.0)
pH, Arterial: 7.47 — ABNORMAL HIGH (ref 7.350–7.450)
pO2, Arterial: 149 mmHg — ABNORMAL HIGH (ref 83.0–108.0)

## 2019-05-12 LAB — GLUCOSE, CAPILLARY
Glucose-Capillary: 112 mg/dL — ABNORMAL HIGH (ref 70–99)
Glucose-Capillary: 141 mg/dL — ABNORMAL HIGH (ref 70–99)
Glucose-Capillary: 160 mg/dL — ABNORMAL HIGH (ref 70–99)
Glucose-Capillary: 170 mg/dL — ABNORMAL HIGH (ref 70–99)
Glucose-Capillary: 95 mg/dL (ref 70–99)

## 2019-05-12 MED ORDER — STERILE WATER FOR INJECTION IJ SOLN
INTRAMUSCULAR | Status: AC
  Filled 2019-05-12: qty 10

## 2019-05-12 MED ORDER — PROPOFOL 1000 MG/100ML IV EMUL
INTRAVENOUS | Status: AC
  Filled 2019-05-12: qty 100

## 2019-05-12 MED ORDER — INSULIN ASPART 100 UNIT/ML ~~LOC~~ SOLN
0.0000 [IU] | SUBCUTANEOUS | Status: DC
  Administered 2019-05-13: 13:00:00 2 [IU] via SUBCUTANEOUS
  Filled 2019-05-12 (×2): qty 1

## 2019-05-12 MED ORDER — MIDAZOLAM HCL 2 MG/2ML IJ SOLN
INTRAMUSCULAR | Status: AC
  Filled 2019-05-12: qty 4

## 2019-05-12 MED ORDER — HYDRALAZINE HCL 20 MG/ML IJ SOLN: 10.0000 mg | Freq: Four times a day (QID) | INTRAMUSCULAR | Status: DC | PRN

## 2019-05-12 MED ORDER — FENTANYL CITRATE (PF) 100 MCG/2ML IJ SOLN
200.0000 ug | Freq: Once | INTRAMUSCULAR | Status: AC
  Administered 2019-05-12: 15:00:00 200 ug via INTRAVENOUS

## 2019-05-12 MED ORDER — FENTANYL CITRATE (PF) 100 MCG/2ML IJ SOLN
INTRAMUSCULAR | Status: AC
  Filled 2019-05-12: qty 4

## 2019-05-12 MED ORDER — PROPOFOL 1000 MG/100ML IV EMUL
5.0000 ug/kg/min | INTRAVENOUS | Status: DC
  Administered 2019-05-12: 16:00:00 20.015 ug/kg/min via INTRAVENOUS
  Administered 2019-05-13 (×3): 35 ug/kg/min via INTRAVENOUS
  Filled 2019-05-12 (×3): qty 100

## 2019-05-12 MED ORDER — APIXABAN 5 MG PO TABS
5.0000 mg | ORAL_TABLET | Freq: Two times a day (BID) | ORAL | Status: DC
  Administered 2019-05-12 – 2019-05-13 (×2): 5 mg via ORAL
  Filled 2019-05-12 (×2): qty 1

## 2019-05-12 MED ORDER — VECURONIUM BROMIDE 10 MG IV SOLR
INTRAVENOUS | Status: AC
  Administered 2019-05-12: 15:00:00 10 mg via INTRAVENOUS
  Filled 2019-05-12: qty 10

## 2019-05-12 MED ORDER — VECURONIUM BROMIDE 10 MG IV SOLR: 10.0000 mg | Freq: Once | INTRAVENOUS | Status: AC

## 2019-05-12 MED ORDER — MIDAZOLAM HCL 2 MG/2ML IJ SOLN
4.0000 mg | Freq: Once | INTRAMUSCULAR | Status: AC
  Administered 2019-05-12: 15:00:00 4 mg via INTRAVENOUS

## 2019-05-12 NOTE — Procedures (Signed)
Central Venous Catheter Placement:TRIPLE LUMEN Indication: Patient receiving vesicant or irritant drug.; Patient receiving intravenous therapy for longer than 5 days.; Patient has limited or no vascular access.   Consent:emergent    Hand washing performed prior to starting the procedure.   Procedure:   An active timeout was performed and correct patient, name, & ID confirmed.   Patient was positioned correctly for central venous access.  Patient was prepped using strict sterile technique including chlorohexadine preps, sterile drape, sterile gown and sterile gloves.    The area was prepped, draped and anesthetized in the usual sterile manner. Patient comfort was obtained.    A triple lumen catheter was placed in RT  Internal Jugular Vein There was good blood return, catheter caps were placed on lumens, catheter flushed easily, the line was secured and a sterile dressing and BIO-PATCH applied.   Ultrasound was used to visualize vasculature and guidance of needle.   Number of Attempts: 1 Complications:none Estimated Blood Loss: none Chest Radiograph indicated and ordered.  Operator: Kaislyn Gulas.   Susann Lawhorne David Oaklie Durrett, M.D.  [facility]  Medical Director [facility] [place] Medical Director [facility] Cardio-Pulmonary Department     

## 2019-05-12 NOTE — Procedures (Signed)
Endotracheal Intubation: Patient required placement of an artificial airway secondary to Respiratory Failure  Consent: Emergent.   Hand washing performed prior to starting the procedure.   Medications administered for sedation prior to procedure:  Midazolam 4 mg IV,  Vecuronium 10 mg IV, Fentanyl 100 mcg IV.    A time out procedure was called and correct patient, name, & ID confirmed. Needed supplies and equipment were assembled and checked to include ETT, 10 ml syringe, Glidescope, Mac and Miller blades, suction, oxygen and bag mask valve, end tidal CO2 monitor.   Patient was positioned to align the mouth and pharynx to facilitate visualization of the glottis.   Heart rate, SpO2 and blood pressure was continuously monitored during the procedure. Pre-oxygenation was conducted prior to intubation and endotracheal tube was placed through the vocal cords into the trachea.     The artificial airway was placed under direct visualization via glidescope route using a 8.0 ETT on the first attempt.  ETT was secured at 23 cm mark.  Placement was confirmed by auscuitation of lungs with good breath sounds bilaterally and no stomach sounds.  Condensation was noted on endotracheal tube.   Pulse ox 98%.  CO2 detector in place with appropriate color change.   Complications: None .   Operator: Asar Evilsizer.   Chest radiograph ordered and pending.   Comments: OGT placed via glidescope.  Granite Godman David Jashawn Floyd, M.D.  [facility]  Medical Director [facility] [place] Medical Director [facility]io-Pulmonary Department       

## 2019-05-12 NOTE — Progress Notes (Addendum)
Name: Deborah Miller MRN: [id number] DOB: 1956/03/28    ADMISSION DATE:  05/05/2019 INITIAL CONSULTATION DATE: 05/05/2018  REFERRING MD : Dr. Georgeann Oppenheim   FOLLOW UP: Acute respiratory failure with hypoxia  BRIEF PATIENT DESCRIPTION:  64 yo female initially diagnosed with COVID-19 in the outpatient setting on 12/23 currently admitted with acute hypoxic respiratory failure secondary to COVID-19 pneumonia requiring HFNC and NRB.  SIGNIFICANT EVENTS/STUDIES:  12/31: Pt presented to the ER from PCP with worsening acute hypoxic respiratory failure secondary to COVID-19 pneumonia 01/1: Pt admitted to ICU on Bip[place].  Later transitioned off Bipap to HFNC and NRB 01/3: severe hypoxia, high risk for intubation and death, subcu emphysema developing 01/4: No progress noted, started ivermectin protocol for late pulmonary phase COVID-19 01/5: Adjusted Levemir to twice a day, continues with high oxygen requirements however does not desaturate as easily 01/7: Subcu emphysema markedly increased.  Chest x-ray shows increased subcu emphysema, worsening respiratory failure, will require intubation.   Subjective Continues with requirement of high flow O2 with intermittent supplementation with 100% nonrebreather mask.  She states she feels somewhat better, not as air hungry.  Still with significant subcu emphysema but not as marked today.   EXAM:  COVID-19 DISASTER DECLARATION:   FULL CONTACT PHYSICAL EXAMINATION WAS NOT POSSIBLE DUE TO TREATMENT OF COVID-19 AND   CONSERVATION OF PERSONAL PROTECTIVE EQUIPMENT, LIMITED EXAM FINDINGS INCLUDE-   Patient assessed or the symptoms described in the history of present illness.   In the context of the Global COVID-19 pandemic, which necessitated consideration that the patient might be at risk for infection with the SARS-CoV-2 virus that causes COVID-19, Institutional protocols and algorithms that pertain to the evaluation of patients at risk for COVID-19 are in  a state of rapid change based on information released by regulatory bodies including the CDC and federal and state organizations. These policies and algorithms were followed during the patient's care while in hospital.   Tachypneic, she appears very fatigued.  Significantly increased subcu emphysema.  Unable to auscultate well due to CAPR.  Voice distorted due to CPAP and subcu emphysema.  Agrees to intubation as she is fatiguing.  Discussed with patient, she agrees with intubation, she is very apprehensive as it is to be expected.  VITAL SIGNS: Temp:  [96.7 F (35.9 C)-98.8 F (37.1 C)] 98 F (36.7 C) (01/07 1600) Pulse Rate:  [102-132] 122 (01/07 1600) Resp:  [14-38] 21 (01/07 1600) BP: (128-196)/(85-138) 131/91 (01/07 1600) SpO2:  [87 %-97 %] 95 % (01/07 1600) FiO2 (%):  [85 %-100 %] 100 % (01/07 1600)    Recent Labs  Lab 05/10/19 0838 05/11/19 0808 05/12/19 0244  NA 137 135 133*  K 4.0 3.7 3.8  CL 95* 96* 96*  CO2 28 27 28   BUN 32* 31* 30*  CREATININE 0.63 0.66 0.60  GLUCOSE 251* 121* 151*   Recent Labs  Lab 05/10/19 0838 05/11/19 0808 05/12/19 0244  HGB 14.1 15.1* 13.8  HCT 44.3 48.1* 43.0  WBC 21.5* 20.5* 18.4*  PLT 253 291 281   DG Abd 1 View  Result Date: 05/12/2019 CLINICAL DATA:  64 year old female intubated and enteric tube placed. EXAM: ABDOMEN - 1 VIEW COMPARISON:  Portable chest at the same time. CT Abdomen and Pelvis 07/11/2011. FINDINGS: Portable AP supine view at 1543 hours. The enteric tube is looped in the stomach, left abdomen. Side hole at the level of the gastric body. The gastric distention seen on the portable chest does not persist. Visualized bowel  gas pattern is non obstructed. Stable lung bases. Stable cholecystectomy clips. IMPRESSION: 1. Enteric tube looped in the stomach, with resolved distension of the stomach seen on the contemporary portable chest. 2. Normal visible bowel gas pattern. Electronically Signed   By: Genevie Ann M.D.   On:  05/12/2019 16:14   DG Chest Port 1 View  Result Date: 05/12/2019 CLINICAL DATA:  64 year old female intubated. Positive COVID-19. EXAM: PORTABLE CHEST 1 VIEW COMPARISON:  1102 hours today. FINDINGS: In good position between the level portable AP semi upright view at 1541 hours. Endotracheal tube tip the clavicles and carina. Enteric tube courses to the abdomen, tip not included. Right IJ central line also placed, tip at the level of the cavoatrial junction. Severe bilateral chest wall and lower neck subcutaneous emphysema again noted. It is unclear whether there might be a persistent small right pneumothorax. No mediastinal shift. Stable cardiac size and mediastinal contours. Coarse bilateral pulmonary opacity, with improved left upper lung and right lower lung ventilation from earlier today. Partially visible gas distended stomach. IMPRESSION: 1. Right IJ central line placed, tip at the level of the cavoatrial junction. 2. ETT in good position. Enteric tube courses into the stomach, tip not included. 3. Gastric distention, consider NG tube to suction. 4. Mildly improved bilateral ventilation from earlier today with continued severe bilateral chest wall and lower neck subcutaneous emphysema again noted. No obvious pneumothorax identified now. Electronically Signed   By: Genevie Ann M.D.   On: 05/12/2019 16:12   DG Chest Port 1 View  Result Date: 05/12/2019 CLINICAL DATA:  Shortness of breath, COVID positive EXAM: PORTABLE CHEST 1 VIEW COMPARISON:  05/08/2019 FINDINGS: Diffuse extensive subcutaneous emphysema, increasing since prior study. Small right apical pneumothorax now visible. Extensive airspace disease bilaterally. Mild cardiomegaly. No visible effusions. IMPRESSION: Small right apical pneumothorax, 5-10%. Extensive worsening subcutaneous emphysema. Patchy bilateral airspace disease, unchanged. Electronically Signed   By: Rolm Baptise M.D.   On: 05/12/2019 11:16    ASSESSMENT / PLAN:  Acute hypoxic  respiratory failure secondary to COVID-19 with late pulmonary phase Hx: Asthma, Cor Pulmonale, OSA, and Morbid Obesity  Respiratory status has deteriorated will need intubation Finished remdesivir, continue vitamins  Continue Ciclesonide inhaler 4 inhalations twice a day or budesonide now that will have closed circuit via vent Continue Ivermectin (late pulmonary phase) second dose tomorrow,doxycycline,solumedrol Trend inflammatory markers: Currently have plateaued  Trend WBC and monitor fever curve Follow cultures: Negative so far  Pulmonary hygiene/IS while awake May need proning once intubated   Maintain airborne and contact precautions  Continuous telemetry monitoring Chest x-ray shows increased subcu emphysema and perhaps small sliver of pneumothorax on right  Borderline diabetes mellitus  CBG ac/hs SSI  Add baseline Levemir  Anxiety Continue outpatient xanax  Sedatives and anxiolysis according to ventilator management for ventilator discomfort  Best Practice: VTE px: subq lovenox SUP px: Famotidine  Diet: N.P.O., consult dietitian for tube feeds   Multidisciplinary rounds were performed, patient's management discussed.  Overall, patient is critically ill, prognosis is guarded.  She has deteriorated, will need intubation, please see note by Dr. Mortimer Fries.  Attempted to reach daughter and mother however, unable to reach them via phone.  Renold Don, MD [facility]  This note was dictated using voice recognition software/Dragon.  Despite best efforts to proofread, errors can occur which can change the meaning.  Any change was purely unintentional.

## 2019-05-12 NOTE — Progress Notes (Signed)
Clinical status relayed to family  Updated and notified of patients medical condition-  very low chance of meaningful recovery.   Family understands the situation.    Family are satisfied with Plan of action and management. All questions answered  Lucie Leather, M.D.  [facility]  Medical Director [facility] Medical Director [facility] Cardio-Pulmonary Department

## 2019-05-12 NOTE — Progress Notes (Signed)
Assessed BUE for IV access.  No suitable veins noted for midline or PICC.  Was able to gain access in LAFA without ultrasound on superfical vessel.  RN notified, may need CVC if more access required.

## 2019-05-12 NOTE — Progress Notes (Signed)
Patient is now in respiratory distress. RR between 38-40 bpm with accessory muscle breathing. Critical care team aware. Attempts were made by self and MD to reach step daughter and her sister but they were not available. Patient currently prepared for RSI and central line placement.

## 2019-05-13 ENCOUNTER — Inpatient Hospital Stay: Payer: BC Managed Care – PPO

## 2019-05-13 ENCOUNTER — Inpatient Hospital Stay (HOSPITAL_COMMUNITY): Payer: BC Managed Care – PPO

## 2019-05-13 ENCOUNTER — Inpatient Hospital Stay (HOSPITAL_COMMUNITY)
Admission: AD | Admit: 2019-05-13 | Discharge: 2019-06-06 | DRG: 207 | Disposition: E | Payer: BC Managed Care – PPO | Source: Other Acute Inpatient Hospital | Attending: Pulmonary Disease | Admitting: Pulmonary Disease

## 2019-05-13 DIAGNOSIS — Z515 Encounter for palliative care: Secondary | ICD-10-CM | POA: Diagnosis not present

## 2019-05-13 DIAGNOSIS — Z6841 Body Mass Index (BMI) 40.0 and over, adult: Secondary | ICD-10-CM

## 2019-05-13 DIAGNOSIS — J9600 Acute respiratory failure, unspecified whether with hypoxia or hypercapnia: Secondary | ICD-10-CM

## 2019-05-13 DIAGNOSIS — K922 Gastrointestinal hemorrhage, unspecified: Secondary | ICD-10-CM | POA: Diagnosis not present

## 2019-05-13 DIAGNOSIS — Z79899 Other long term (current) drug therapy: Secondary | ICD-10-CM

## 2019-05-13 DIAGNOSIS — A419 Sepsis, unspecified organism: Secondary | ICD-10-CM | POA: Diagnosis not present

## 2019-05-13 DIAGNOSIS — J80 Acute respiratory distress syndrome: Secondary | ICD-10-CM

## 2019-05-13 DIAGNOSIS — Z9289 Personal history of other medical treatment: Secondary | ICD-10-CM

## 2019-05-13 DIAGNOSIS — R571 Hypovolemic shock: Secondary | ICD-10-CM | POA: Diagnosis not present

## 2019-05-13 DIAGNOSIS — F419 Anxiety disorder, unspecified: Secondary | ICD-10-CM | POA: Diagnosis present

## 2019-05-13 DIAGNOSIS — R34 Anuria and oliguria: Secondary | ICD-10-CM | POA: Diagnosis present

## 2019-05-13 DIAGNOSIS — D649 Anemia, unspecified: Secondary | ICD-10-CM | POA: Diagnosis not present

## 2019-05-13 DIAGNOSIS — I82452 Acute embolism and thrombosis of left peroneal vein: Secondary | ICD-10-CM | POA: Diagnosis present

## 2019-05-13 DIAGNOSIS — U071 COVID-19: Principal | ICD-10-CM | POA: Diagnosis present

## 2019-05-13 DIAGNOSIS — Z7982 Long term (current) use of aspirin: Secondary | ICD-10-CM

## 2019-05-13 DIAGNOSIS — R739 Hyperglycemia, unspecified: Secondary | ICD-10-CM | POA: Diagnosis not present

## 2019-05-13 DIAGNOSIS — K219 Gastro-esophageal reflux disease without esophagitis: Secondary | ICD-10-CM | POA: Diagnosis present

## 2019-05-13 DIAGNOSIS — Z794 Long term (current) use of insulin: Secondary | ICD-10-CM

## 2019-05-13 DIAGNOSIS — G4733 Obstructive sleep apnea (adult) (pediatric): Secondary | ICD-10-CM | POA: Diagnosis present

## 2019-05-13 DIAGNOSIS — E11649 Type 2 diabetes mellitus with hypoglycemia without coma: Secondary | ICD-10-CM | POA: Diagnosis not present

## 2019-05-13 DIAGNOSIS — J1282 Pneumonia due to coronavirus disease 2019: Secondary | ICD-10-CM | POA: Diagnosis present

## 2019-05-13 DIAGNOSIS — Z881 Allergy status to other antibiotic agents status: Secondary | ICD-10-CM

## 2019-05-13 DIAGNOSIS — Z91040 Latex allergy status: Secondary | ICD-10-CM

## 2019-05-13 DIAGNOSIS — Z885 Allergy status to narcotic agent status: Secondary | ICD-10-CM

## 2019-05-13 DIAGNOSIS — J690 Pneumonitis due to inhalation of food and vomit: Secondary | ICD-10-CM | POA: Diagnosis not present

## 2019-05-13 DIAGNOSIS — Z91018 Allergy to other foods: Secondary | ICD-10-CM

## 2019-05-13 DIAGNOSIS — I10 Essential (primary) hypertension: Secondary | ICD-10-CM | POA: Diagnosis present

## 2019-05-13 DIAGNOSIS — N179 Acute kidney failure, unspecified: Secondary | ICD-10-CM | POA: Diagnosis not present

## 2019-05-13 DIAGNOSIS — F329 Major depressive disorder, single episode, unspecified: Secondary | ICD-10-CM | POA: Diagnosis present

## 2019-05-13 DIAGNOSIS — K7200 Acute and subacute hepatic failure without coma: Secondary | ICD-10-CM | POA: Diagnosis not present

## 2019-05-13 DIAGNOSIS — J982 Interstitial emphysema: Secondary | ICD-10-CM | POA: Diagnosis present

## 2019-05-13 DIAGNOSIS — R578 Other shock: Secondary | ICD-10-CM | POA: Diagnosis not present

## 2019-05-13 DIAGNOSIS — Z8249 Family history of ischemic heart disease and other diseases of the circulatory system: Secondary | ICD-10-CM

## 2019-05-13 DIAGNOSIS — K559 Vascular disorder of intestine, unspecified: Secondary | ICD-10-CM | POA: Diagnosis not present

## 2019-05-13 DIAGNOSIS — F05 Delirium due to known physiological condition: Secondary | ICD-10-CM | POA: Diagnosis not present

## 2019-05-13 DIAGNOSIS — Z8041 Family history of malignant neoplasm of ovary: Secondary | ICD-10-CM

## 2019-05-13 DIAGNOSIS — R0602 Shortness of breath: Secondary | ICD-10-CM

## 2019-05-13 DIAGNOSIS — G92 Toxic encephalopathy: Secondary | ICD-10-CM | POA: Diagnosis not present

## 2019-05-13 DIAGNOSIS — G9341 Metabolic encephalopathy: Secondary | ICD-10-CM | POA: Diagnosis not present

## 2019-05-13 DIAGNOSIS — Z66 Do not resuscitate: Secondary | ICD-10-CM | POA: Diagnosis not present

## 2019-05-13 DIAGNOSIS — E876 Hypokalemia: Secondary | ICD-10-CM | POA: Diagnosis not present

## 2019-05-13 DIAGNOSIS — Z981 Arthrodesis status: Secondary | ICD-10-CM

## 2019-05-13 DIAGNOSIS — Z96651 Presence of right artificial knee joint: Secondary | ICD-10-CM | POA: Diagnosis present

## 2019-05-13 DIAGNOSIS — E1165 Type 2 diabetes mellitus with hyperglycemia: Secondary | ICD-10-CM | POA: Diagnosis present

## 2019-05-13 DIAGNOSIS — Z789 Other specified health status: Secondary | ICD-10-CM

## 2019-05-13 DIAGNOSIS — R58 Hemorrhage, not elsewhere classified: Secondary | ICD-10-CM | POA: Diagnosis not present

## 2019-05-13 DIAGNOSIS — J069 Acute upper respiratory infection, unspecified: Secondary | ICD-10-CM | POA: Diagnosis not present

## 2019-05-13 DIAGNOSIS — E875 Hyperkalemia: Secondary | ICD-10-CM | POA: Diagnosis present

## 2019-05-13 DIAGNOSIS — Z9049 Acquired absence of other specified parts of digestive tract: Secondary | ICD-10-CM

## 2019-05-13 DIAGNOSIS — R6521 Severe sepsis with septic shock: Secondary | ICD-10-CM | POA: Diagnosis not present

## 2019-05-13 DIAGNOSIS — J45901 Unspecified asthma with (acute) exacerbation: Secondary | ICD-10-CM | POA: Diagnosis present

## 2019-05-13 DIAGNOSIS — Z833 Family history of diabetes mellitus: Secondary | ICD-10-CM

## 2019-05-13 DIAGNOSIS — Z0189 Encounter for other specified special examinations: Secondary | ICD-10-CM

## 2019-05-13 DIAGNOSIS — Z87891 Personal history of nicotine dependence: Secondary | ICD-10-CM

## 2019-05-13 DIAGNOSIS — Z7901 Long term (current) use of anticoagulants: Secondary | ICD-10-CM

## 2019-05-13 DIAGNOSIS — Z96641 Presence of right artificial hip joint: Secondary | ICD-10-CM | POA: Diagnosis present

## 2019-05-13 LAB — FERRITIN: Ferritin: 178 ng/mL (ref 11–307)

## 2019-05-13 LAB — POCT I-STAT 7, (LYTES, BLD GAS, ICA,H+H)
Acid-Base Excess: 8 mmol/L — ABNORMAL HIGH (ref 0.0–2.0)
Bicarbonate: 35.6 mmol/L — ABNORMAL HIGH (ref 20.0–28.0)
Calcium, Ion: 1.16 mmol/L (ref 1.15–1.40)
HCT: 40 % (ref 36.0–46.0)
Hemoglobin: 13.6 g/dL (ref 12.0–15.0)
O2 Saturation: 77 %
Potassium: 4.5 mmol/L (ref 3.5–5.1)
Sodium: 135 mmol/L (ref 135–145)
TCO2: 37 mmol/L — ABNORMAL HIGH (ref 22–32)
pCO2 arterial: 60.9 mmHg — ABNORMAL HIGH (ref 32.0–48.0)
pH, Arterial: 7.374 (ref 7.350–7.450)
pO2, Arterial: 44 mmHg — ABNORMAL LOW (ref 83.0–108.0)

## 2019-05-13 LAB — COMPREHENSIVE METABOLIC PANEL
ALT: 31 U/L (ref 0–44)
AST: 29 U/L (ref 15–41)
Albumin: 2.2 g/dL — ABNORMAL LOW (ref 3.5–5.0)
Alkaline Phosphatase: 79 U/L (ref 38–126)
Anion gap: 9 (ref 5–15)
BUN: 29 mg/dL — ABNORMAL HIGH (ref 8–23)
CO2: 31 mmol/L (ref 22–32)
Calcium: 8 mg/dL — ABNORMAL LOW (ref 8.9–10.3)
Chloride: 97 mmol/L — ABNORMAL LOW (ref 98–111)
Creatinine, Ser: 0.7 mg/dL (ref 0.44–1.00)
GFR calc Af Amer: >60 mL/min (ref >60)
GFR calc non Af Amer: >60 mL/min (ref >60)
Glucose, Bld: 102 mg/dL — ABNORMAL HIGH (ref 70–99)
Potassium: 4.2 mmol/L (ref 3.5–5.1)
Sodium: 137 mmol/L (ref 135–145)
Total Bilirubin: 0.7 mg/dL (ref 0.3–1.2)
Total Protein: 5.3 g/dL — ABNORMAL LOW (ref 6.5–8.1)

## 2019-05-13 LAB — CBC WITH DIFFERENTIAL/PLATELET
Abs Immature Granulocytes: 0.2 10*3/uL — ABNORMAL HIGH (ref 0.00–0.07)
Basophils Absolute: 0 10*3/uL (ref 0.0–0.1)
Basophils Relative: 0 %
Eosinophils Absolute: 0 10*3/uL (ref 0.0–0.5)
Eosinophils Relative: 0 %
HCT: 40.4 % (ref 36.0–46.0)
Hemoglobin: 12.7 g/dL (ref 12.0–15.0)
Immature Granulocytes: 1 %
Lymphocytes Relative: 5 %
Lymphs Abs: 0.7 10*3/uL (ref 0.7–4.0)
MCH: 25.5 pg — ABNORMAL LOW (ref 26.0–34.0)
MCHC: 31.4 g/dL (ref 30.0–36.0)
MCV: 81 fL (ref 80.0–100.0)
Monocytes Absolute: 0.3 10*3/uL (ref 0.1–1.0)
Monocytes Relative: 2 %
Neutro Abs: 12.5 10*3/uL — ABNORMAL HIGH (ref 1.7–7.7)
Neutrophils Relative %: 92 %
Platelets: 231 10*3/uL (ref 150–400)
RBC: 4.99 MIL/uL (ref 3.87–5.11)
RDW: 15 % (ref 11.5–15.5)
WBC: 13.8 10*3/uL — ABNORMAL HIGH (ref 4.0–10.5)
nRBC: 0 % (ref 0.0–0.2)

## 2019-05-13 LAB — GLUCOSE, CAPILLARY
Glucose-Capillary: 92 mg/dL (ref 70–99)
Glucose-Capillary: 97 mg/dL (ref 70–99)
Glucose-Capillary: 126 mg/dL — ABNORMAL HIGH (ref 70–99)
Glucose-Capillary: 109 mg/dL — ABNORMAL HIGH (ref 70–99)
Glucose-Capillary: 135 mg/dL — ABNORMAL HIGH (ref 70–99)

## 2019-05-13 LAB — PHOSPHORUS: Phosphorus: 3.8 mg/dL (ref 2.5–4.6)

## 2019-05-13 LAB — C-REACTIVE PROTEIN: CRP: 5 mg/dL — ABNORMAL HIGH (ref <1.0)

## 2019-05-13 LAB — MAGNESIUM: Magnesium: 2.2 mg/dL (ref 1.7–2.4)

## 2019-05-13 LAB — FIBRIN DERIVATIVES D-DIMER (ARMC ONLY): Fibrin derivatives D-dimer (ARMC): 1802.14 ng/mL (FEU) — ABNORMAL HIGH (ref 0.00–499.00)

## 2019-05-13 MED ORDER — INSULIN ASPART 100 UNIT/ML ~~LOC~~ SOLN
0.0000 [IU] | SUBCUTANEOUS | Status: DC
  Administered 2019-05-13: 3 [IU] via SUBCUTANEOUS
  Administered 2019-05-14: 7 [IU] via SUBCUTANEOUS
  Administered 2019-05-14 (×4): 11 [IU] via SUBCUTANEOUS
  Administered 2019-05-14 – 2019-05-15 (×4): 7 [IU] via SUBCUTANEOUS
  Administered 2019-05-15 (×2): 11 [IU] via SUBCUTANEOUS
  Administered 2019-05-15 (×2): 7 [IU] via SUBCUTANEOUS
  Administered 2019-05-16: 09:00:00 11 [IU] via SUBCUTANEOUS
  Administered 2019-05-16: 17:00:00 15 [IU] via SUBCUTANEOUS
  Administered 2019-05-16: 13:00:00 11 [IU] via SUBCUTANEOUS
  Administered 2019-05-16: 7 [IU] via SUBCUTANEOUS
  Administered 2019-05-16: 04:00:00 11 [IU] via SUBCUTANEOUS
  Administered 2019-05-16: 20:00:00 15 [IU] via SUBCUTANEOUS
  Administered 2019-05-17: 20 [IU] via SUBCUTANEOUS
  Administered 2019-05-17: 08:00:00 11 [IU] via SUBCUTANEOUS
  Administered 2019-05-17: 04:00:00 4 [IU] via SUBCUTANEOUS
  Administered 2019-05-17: 21:00:00 15 [IU] via SUBCUTANEOUS
  Administered 2019-05-17: 17:00:00 11 [IU] via SUBCUTANEOUS
  Administered 2019-05-18 (×2): 4 [IU] via SUBCUTANEOUS
  Administered 2019-05-18 (×2): 11 [IU] via SUBCUTANEOUS
  Administered 2019-05-18: 7 [IU] via SUBCUTANEOUS
  Administered 2019-05-18 (×2): 4 [IU] via SUBCUTANEOUS
  Administered 2019-05-19: 16:00:00 7 [IU] via SUBCUTANEOUS
  Administered 2019-05-19 (×2): 4 [IU] via SUBCUTANEOUS
  Administered 2019-05-19: 09:00:00 7 [IU] via SUBCUTANEOUS
  Administered 2019-05-19: 3 [IU] via SUBCUTANEOUS
  Administered 2019-05-19: 15 [IU] via SUBCUTANEOUS
  Administered 2019-05-20: 08:00:00 4 [IU] via SUBCUTANEOUS
  Administered 2019-05-20 – 2019-05-21 (×5): 11 [IU] via SUBCUTANEOUS
  Administered 2019-05-21: 09:00:00 4 [IU] via SUBCUTANEOUS
  Administered 2019-05-21 (×2): 11 [IU] via SUBCUTANEOUS
  Administered 2019-05-22: 04:00:00 4 [IU] via SUBCUTANEOUS
  Administered 2019-05-22: 3 [IU] via SUBCUTANEOUS
  Administered 2019-05-24 (×2): 4 [IU] via SUBCUTANEOUS

## 2019-05-13 MED ORDER — IPRATROPIUM-ALBUTEROL 0.5-2.5 (3) MG/3ML IN SOLN
3.0000 mL | RESPIRATORY_TRACT | Status: DC
  Administered 2019-05-13 – 2019-05-19 (×32): 3 mL via RESPIRATORY_TRACT
  Filled 2019-05-13 (×32): qty 3

## 2019-05-13 MED ORDER — FENTANYL 2500MCG IN NS 250ML (10MCG/ML) PREMIX INFUSION
50.0000 ug/h | INTRAVENOUS | Status: DC
  Administered 2019-05-13: 50 ug/h via INTRAVENOUS
  Filled 2019-05-13: qty 250

## 2019-05-13 MED ORDER — FENTANYL BOLUS VIA INFUSION
50.0000 ug | INTRAVENOUS | Status: DC | PRN
  Administered 2019-05-13: 50 ug via INTRAVENOUS
  Filled 2019-05-13: qty 50

## 2019-05-13 MED ORDER — ENOXAPARIN SODIUM 100 MG/ML ~~LOC~~ SOLN
1.0000 mg/kg | Freq: Two times a day (BID) | SUBCUTANEOUS | Status: DC
  Administered 2019-05-13 – 2019-05-16 (×6): 85 mg via SUBCUTANEOUS
  Filled 2019-05-13 (×7): qty 1

## 2019-05-13 MED ORDER — THIAMINE HCL 100 MG PO TABS
200.0000 mg | ORAL_TABLET | Freq: Every day | ORAL | Status: DC
  Administered 2019-05-14 – 2019-05-24 (×11): 200 mg
  Filled 2019-05-13 (×11): qty 2

## 2019-05-13 MED ORDER — CLONAZEPAM 0.5 MG PO TABS
2.0000 mg | ORAL_TABLET | Freq: Two times a day (BID) | ORAL | Status: DC
  Administered 2019-05-13 – 2019-05-18 (×10): 2 mg
  Filled 2019-05-13 (×10): qty 4

## 2019-05-13 MED ORDER — FAMOTIDINE 20 MG PO TABS: 40.0000 mg | ORAL_TABLET | Freq: Every day | ORAL | Status: DC

## 2019-05-13 MED ORDER — ASCORBIC ACID 500 MG PO TABS
500.0000 mg | ORAL_TABLET | Freq: Two times a day (BID) | ORAL | Status: DC
  Administered 2019-05-13 – 2019-05-24 (×22): 500 mg
  Filled 2019-05-13 (×22): qty 1

## 2019-05-13 MED ORDER — METHYLPREDNISOLONE SODIUM SUCC 40 MG IJ SOLR
40.0000 mg | Freq: Two times a day (BID) | INTRAMUSCULAR | Status: DC
  Administered 2019-05-13 – 2019-05-16 (×6): 40 mg via INTRAVENOUS
  Filled 2019-05-13 (×6): qty 1

## 2019-05-13 MED ORDER — ASPIRIN 81 MG PO CHEW: 81.0000 mg | CHEWABLE_TABLET | Freq: Every day | ORAL | Status: AC

## 2019-05-13 MED ORDER — ASPIRIN 81 MG PO CHEW
81.0000 mg | CHEWABLE_TABLET | Freq: Every day | ORAL | Status: DC
  Administered 2019-05-14 – 2019-05-24 (×10): 81 mg
  Filled 2019-05-13 (×10): qty 1

## 2019-05-13 MED ORDER — VECURONIUM BROMIDE 10 MG IV SOLR
0.1000 mg/kg | INTRAVENOUS | Status: DC | PRN
  Administered 2019-05-13 – 2019-05-14 (×2): 8.5 mg via INTRAVENOUS
  Filled 2019-05-13 (×2): qty 10

## 2019-05-13 MED ORDER — VITAMIN D3 25 MCG PO TABS: 1000.0000 [IU] | ORAL_TABLET | Freq: Every day | ORAL | 0 refills | Status: AC

## 2019-05-13 MED ORDER — FENTANYL 2500MCG IN NS 250ML (10MCG/ML) PREMIX INFUSION
50.0000 ug/h | INTRAVENOUS | Status: DC
  Administered 2019-05-13: 100 ug/h via INTRAVENOUS
  Administered 2019-05-14 – 2019-05-15 (×3): 200 ug/h via INTRAVENOUS
  Administered 2019-05-16: 01:00:00 125 ug/h via INTRAVENOUS
  Administered 2019-05-16: 250 ug/h via INTRAVENOUS
  Administered 2019-05-17: 150 ug/h via INTRAVENOUS
  Administered 2019-05-18: 03:00:00 200 ug/h via INTRAVENOUS
  Filled 2019-05-13 (×8): qty 250

## 2019-05-13 MED ORDER — SODIUM CHLORIDE 0.9 % IV SOLN: 100.0000 mg | Freq: Two times a day (BID) | INTRAVENOUS | Status: AC

## 2019-05-13 MED ORDER — FAMOTIDINE 40 MG PO TABS: 40.0000 mg | ORAL_TABLET | Freq: Every day | ORAL | Status: AC

## 2019-05-13 MED ORDER — FENTANYL BOLUS VIA INFUSION
50.0000 ug | INTRAVENOUS | Status: DC | PRN
  Administered 2019-05-13 (×2): 50 ug via INTRAVENOUS
  Filled 2019-05-13: qty 50

## 2019-05-13 MED ORDER — THIAMINE HCL 100 MG PO TABS: 200.0000 mg | ORAL_TABLET | Freq: Every day | ORAL | Status: AC

## 2019-05-13 MED ORDER — HYDROXYZINE HCL 10 MG PO TABS: 10.0000 mg | ORAL_TABLET | Freq: Three times a day (TID) | ORAL | 0 refills | Status: AC | PRN

## 2019-05-13 MED ORDER — FENTANYL CITRATE (PF) 100 MCG/2ML IJ SOLN: 50.0000 ug | Freq: Once | INTRAMUSCULAR | Status: DC

## 2019-05-13 MED ORDER — SODIUM CHLORIDE 0.9 % IV SOLN
100.0000 mg | Freq: Two times a day (BID) | INTRAVENOUS | Status: DC
  Administered 2019-05-13: 100 mg via INTRAVENOUS
  Filled 2019-05-13: qty 100

## 2019-05-13 MED ORDER — MIDAZOLAM HCL 2 MG/2ML IJ SOLN: 4.0000 mg | Freq: Once | INTRAMUSCULAR | Status: AC

## 2019-05-13 MED ORDER — ARFORMOTEROL TARTRATE 15 MCG/2ML IN NEBU
15.0000 ug | INHALATION_SOLUTION | Freq: Two times a day (BID) | RESPIRATORY_TRACT | Status: DC
  Administered 2019-05-13 – 2019-05-21 (×17): 15 ug via RESPIRATORY_TRACT
  Filled 2019-05-13 (×19): qty 2

## 2019-05-13 MED ORDER — VITAL HIGH PROTEIN PO LIQD: 1000.0000 mL | ORAL | Status: DC

## 2019-05-13 MED ORDER — OXYCODONE HCL 5 MG/5ML PO SOLN: 5.0000 mg | Freq: Four times a day (QID) | ORAL | Status: DC

## 2019-05-13 MED ORDER — ORAL CARE MOUTH RINSE
15.0000 mL | OROMUCOSAL | Status: DC
  Administered 2019-05-13 – 2019-05-24 (×107): 15 mL via OROMUCOSAL

## 2019-05-13 MED ORDER — INSULIN ASPART 100 UNIT/ML ~~LOC~~ SOLN: 0.0000 [IU] | SUBCUTANEOUS | 11 refills | Status: AC

## 2019-05-13 MED ORDER — VITAMIN D3 25 MCG PO TABS
1000.0000 [IU] | ORAL_TABLET | Freq: Every day | ORAL | Status: DC
  Administered 2019-05-14 – 2019-05-24 (×11): 1000 [IU]
  Filled 2019-05-13 (×13): qty 1

## 2019-05-13 MED ORDER — FREE WATER
200.0000 mL | Status: DC
  Administered 2019-05-13 – 2019-05-17 (×21): 200 mL

## 2019-05-13 MED ORDER — MIDAZOLAM HCL 2 MG/2ML IJ SOLN
INTRAMUSCULAR | Status: AC
  Administered 2019-05-13: 4 mg via INTRAVENOUS
  Filled 2019-05-13: qty 4

## 2019-05-13 MED ORDER — CHLORHEXIDINE GLUCONATE 0.12% ORAL RINSE (MEDLINE KIT)
15.0000 mL | Freq: Two times a day (BID) | OROMUCOSAL | Status: DC
  Administered 2019-05-13 – 2019-05-15 (×4): 15 mL via OROMUCOSAL

## 2019-05-13 MED ORDER — ONDANSETRON HCL 4 MG/2ML IJ SOLN: 4.0000 mg | Freq: Three times a day (TID) | INTRAMUSCULAR | Status: DC | PRN

## 2019-05-13 MED ORDER — ARTIFICIAL TEARS OPHTHALMIC OINT
1.0000 "application " | TOPICAL_OINTMENT | Freq: Three times a day (TID) | OPHTHALMIC | Status: DC
  Administered 2019-05-13 – 2019-05-17 (×11): 1 via OPHTHALMIC
  Filled 2019-05-13: qty 3.5

## 2019-05-13 MED ORDER — BUDESONIDE 0.5 MG/2ML IN SUSP
0.5000 mg | Freq: Two times a day (BID) | RESPIRATORY_TRACT | Status: DC
  Administered 2019-05-13 – 2019-05-21 (×17): 0.5 mg via RESPIRATORY_TRACT
  Filled 2019-05-13 (×17): qty 2

## 2019-05-13 MED ORDER — ONDANSETRON HCL 4 MG/2ML IJ SOLN: 4.0000 mg | Freq: Three times a day (TID) | INTRAMUSCULAR | 0 refills | Status: AC | PRN

## 2019-05-13 MED ORDER — LACTATED RINGERS IV SOLN: INTRAVENOUS | Status: DC

## 2019-05-13 MED ORDER — ASCORBIC ACID 500 MG PO TABS: 500.0000 mg | ORAL_TABLET | Freq: Two times a day (BID) | ORAL | Status: AC

## 2019-05-13 MED ORDER — MIDAZOLAM 50MG/50ML (1MG/ML) PREMIX INFUSION
2.0000 mg/h | INTRAVENOUS | Status: DC
  Administered 2019-05-13: 18:00:00 3 mg/h via INTRAVENOUS
  Administered 2019-05-14 (×2): 6 mg/h via INTRAVENOUS
  Administered 2019-05-15: 4 mg/h via INTRAVENOUS
  Administered 2019-05-15: 02:00:00 5 mg/h via INTRAVENOUS
  Administered 2019-05-16 – 2019-05-17 (×3): 4 mg/h via INTRAVENOUS
  Filled 2019-05-13 (×9): qty 50

## 2019-05-13 MED ORDER — PRO-STAT SUGAR FREE PO LIQD
30.0000 mL | Freq: Two times a day (BID) | ORAL | Status: DC
  Administered 2019-05-13: 30 mL
  Filled 2019-05-13 (×2): qty 30

## 2019-05-13 MED ORDER — OXYCODONE HCL 5 MG/5ML PO SOLN
5.0000 mg | Freq: Four times a day (QID) | ORAL | Status: DC
  Administered 2019-05-13 – 2019-05-14 (×4): 5 mg
  Filled 2019-05-13 (×4): qty 5

## 2019-05-13 MED ORDER — METHYLPREDNISOLONE SODIUM SUCC 40 MG IJ SOLR: 40.0000 mg | Freq: Two times a day (BID) | INTRAMUSCULAR | 0 refills | Status: AC

## 2019-05-13 MED ORDER — MIDAZOLAM BOLUS VIA INFUSION
1.0000 mg | INTRAVENOUS | Status: DC | PRN
  Administered 2019-05-13: 2 mg via INTRAVENOUS
  Filled 2019-05-13: qty 2

## 2019-05-13 MED ORDER — PROPOFOL 1000 MG/100ML IV EMUL
0.0000 ug/kg/min | INTRAVENOUS | Status: DC
  Administered 2019-05-13: 40 ug/kg/min via INTRAVENOUS
  Filled 2019-05-13: qty 100

## 2019-05-13 MED ORDER — LEVALBUTEROL TARTRATE 45 MCG/ACT IN AERO: 2.0000 | INHALATION_SPRAY | Freq: Four times a day (QID) | RESPIRATORY_TRACT | 12 refills | Status: AC | PRN

## 2019-05-13 MED ORDER — PANTOPRAZOLE SODIUM 40 MG IV SOLR
40.0000 mg | Freq: Every day | INTRAVENOUS | Status: DC
  Administered 2019-05-13: 40 mg via INTRAVENOUS
  Filled 2019-05-13: qty 40

## 2019-05-13 MED ORDER — METHOCARBAMOL 1000 MG/10ML IJ SOLN: 500.0000 mg | Freq: Four times a day (QID) | INTRAVENOUS | Status: AC | PRN

## 2019-05-13 MED ORDER — LORAZEPAM 2 MG/ML IJ SOLN: 1.0000 mg | INTRAMUSCULAR | 0 refills | Status: AC | PRN

## 2019-05-13 MED ORDER — INSULIN DETEMIR 100 UNIT/ML ~~LOC~~ SOLN
15.0000 [IU] | Freq: Two times a day (BID) | SUBCUTANEOUS | Status: DC
  Administered 2019-05-13 – 2019-05-18 (×10): 15 [IU] via SUBCUTANEOUS
  Filled 2019-05-13 (×11): qty 0.15

## 2019-05-13 MED ORDER — APIXABAN 5 MG PO TABS: 5.0000 mg | ORAL_TABLET | Freq: Two times a day (BID) | ORAL | Status: AC

## 2019-05-13 MED ORDER — PRO-STAT SUGAR FREE PO LIQD: 30.0000 mL | Freq: Two times a day (BID) | ORAL | Status: DC

## 2019-05-13 MED ORDER — INSULIN DETEMIR 100 UNIT/ML ~~LOC~~ SOLN: 15.0000 [IU] | Freq: Two times a day (BID) | SUBCUTANEOUS | 11 refills | Status: AC

## 2019-05-13 MED ORDER — LIDOCAINE 5 % EX PTCH: 1.0000 | MEDICATED_PATCH | Freq: Every day | CUTANEOUS | 0 refills | Status: AC

## 2019-05-13 MED ORDER — CHLORHEXIDINE GLUCONATE 0.12 % MT SOLN: 15.0000 mL | Freq: Two times a day (BID) | OROMUCOSAL | 0 refills | Status: AC

## 2019-05-13 MED ORDER — SODIUM CHLORIDE 3 % IN NEBU
4.0000 mL | INHALATION_SOLUTION | Freq: Two times a day (BID) | RESPIRATORY_TRACT | Status: AC
  Administered 2019-05-13 – 2019-05-16 (×6): 4 mL via RESPIRATORY_TRACT
  Filled 2019-05-13 (×6): qty 4

## 2019-05-13 MED ORDER — DOCUSATE SODIUM 50 MG/5ML PO LIQD: 100.0000 mg | Freq: Two times a day (BID) | ORAL | Status: DC | PRN

## 2019-05-13 NOTE — Progress Notes (Signed)
Name: Deborah Miller MRN: [id number] DOB: 20-Nov-1955    ADMISSION DATE:  05/05/2019 INITIAL CONSULTATION DATE: 05/05/2018  REFERRING MD : Dr. Georgeann Oppenheim   FOLLOW UP: Acute respiratory failure with hypoxia  BRIEF PATIENT DESCRIPTION:  64 yo female initially diagnosed with COVID-19 in the outpatient setting on 12/23 currently admitted with acute hypoxic respiratory failure secondary to COVID-19 pneumonia requiring HFNC and NRB.  SIGNIFICANT EVENTS/STUDIES:  12/31: Pt presented to the ER from PCP with worsening acute hypoxic respiratory failure secondary to COVID-19 pneumonia 01/1: Pt admitted to ICU on Bipap.  Later transitioned off Bipap to HFNC and NRB 01/3: severe hypoxia, high risk for intubation and death, subcu emphysema developing 01/4: No progress noted, started ivermectin protocol for late pulmonary phase COVID-19 01/5: Adjusted Levemir to twice a day, continues with high oxygen requirements however does not desaturate as easily 01/7: Subcu emphysema markedly increased.  Chest x-ray shows increased subcu emphysema, worsening respiratory failure, will require intubation.  CC follow up resp failure  HPI Severe hypoxia Remains critically ill On vent   EXAM:  COVID-19 DISASTER DECLARATION:   FULL CONTACT PHYSICAL EXAMINATION WAS NOT POSSIBLE DUE TO TREATMENT OF COVID-19 AND   CONSERVATION OF PERSONAL PROTECTIVE EQUIPMENT, LIMITED EXAM FINDINGS INCLUDE-   Patient assessed or the symptoms described in the history of present illness.   In the context of the Global COVID-19 pandemic, which necessitated consideration that the patient might be at risk for infection with the SARS-CoV-2 virus that causes COVID-19, Institutional protocols and algorithms that pertain to the evaluation of patients at risk for COVID-19 are in a state of rapid change based on information released by regulatory bodies including the CDC and federal and state organizations. These policies and algorithms  were followed during the patient's care while in hospital.     VITAL SIGNS: Temp:  [96.7 F (35.9 C)-98.2 F (36.8 C)] 98.2 F (36.8 C) (01/08 0400) Pulse Rate:  [92-132] 100 (01/08 0600) Resp:  [16-37] 22 (01/08 0600) BP: (89-171)/(72-138) 112/77 (01/08 0600) SpO2:  [84 %-100 %] 100 % (01/08 0600) FiO2 (%):  [86 %-100 %] 100 % (01/08 0333)    Recent Labs  Lab 05/11/19 0808 05/12/19 0244 May 27, 2019 0515  NA 135 133* 137  K 3.7 3.8 4.2  CL 96* 96* 97*  CO2 27 28 31   BUN 31* 30* 29*  CREATININE 0.66 0.60 0.70  GLUCOSE 121* 151* 102*   Recent Labs  Lab 05/11/19 0808 05/12/19 0244 2019/05/27 0515  HGB 15.1* 13.8 12.7  HCT 48.1* 43.0 40.4  WBC 20.5* 18.4* 13.8*  PLT 291 281 231   DG Abd 1 View  Result Date: 05/12/2019 CLINICAL DATA:  64 year old female intubated and enteric tube placed. EXAM: ABDOMEN - 1 VIEW COMPARISON:  Portable chest at the same time. CT Abdomen and Pelvis 07/11/2011. FINDINGS: Portable AP supine view at 1543 hours. The enteric tube is looped in the stomach, left abdomen. Side hole at the level of the gastric body. The gastric distention seen on the portable chest does not persist. Visualized bowel gas pattern is non obstructed. Stable lung bases. Stable cholecystectomy clips. IMPRESSION: 1. Enteric tube looped in the stomach, with resolved distension of the stomach seen on the contemporary portable chest. 2. Normal visible bowel gas pattern. Electronically Signed   By: 09/10/2011 M.D.   On: 05/12/2019 16:14   DG Chest Port 1 View  Result Date: 2019/05/27 CLINICAL DATA:  Acute respiratory failure EXAM: PORTABLE CHEST 1 VIEW COMPARISON:  Yesterday  FINDINGS: Widespread soft tissue emphysema, mildly improved with less distended airspaces. No visible pneumothorax. The enteric tube tip is in the stomach and the endotracheal tube tip is just below the clavicular heads. Right IJ line with tip at the distal SVC. Bilateral pulmonary infiltrates correlating with history of  COVID-19. Normal heart size. IMPRESSION: 1. Stable hardware positioning and bilateral airspace disease. 2. Extensive soft tissue emphysema with mild interval improvement. Electronically Signed   By: Monte Fantasia M.D.   On: 06/07/19 04:39   DG Chest Port 1 View  Result Date: 05/12/2019 CLINICAL DATA:  64 year old female intubated. Positive COVID-19. EXAM: PORTABLE CHEST 1 VIEW COMPARISON:  1102 hours today. FINDINGS: In good position between the level portable AP semi upright view at 1541 hours. Endotracheal tube tip the clavicles and carina. Enteric tube courses to the abdomen, tip not included. Right IJ central line also placed, tip at the level of the cavoatrial junction. Severe bilateral chest wall and lower neck subcutaneous emphysema again noted. It is unclear whether there might be a persistent small right pneumothorax. No mediastinal shift. Stable cardiac size and mediastinal contours. Coarse bilateral pulmonary opacity, with improved left upper lung and right lower lung ventilation from earlier today. Partially visible gas distended stomach. IMPRESSION: 1. Right IJ central line placed, tip at the level of the cavoatrial junction. 2. ETT in good position. Enteric tube courses into the stomach, tip not included. 3. Gastric distention, consider NG tube to suction. 4. Mildly improved bilateral ventilation from earlier today with continued severe bilateral chest wall and lower neck subcutaneous emphysema again noted. No obvious pneumothorax identified now. Electronically Signed   By: Genevie Ann M.D.   On: 05/12/2019 16:12   DG Chest Port 1 View  Result Date: 05/12/2019 CLINICAL DATA:  Shortness of breath, COVID positive EXAM: PORTABLE CHEST 1 VIEW COMPARISON:  05/08/2019 FINDINGS: Diffuse extensive subcutaneous emphysema, increasing since prior study. Small right apical pneumothorax now visible. Extensive airspace disease bilaterally. Mild cardiomegaly. No visible effusions. IMPRESSION: Small right apical  pneumothorax, 5-10%. Extensive worsening subcutaneous emphysema. Patchy bilateral airspace disease, unchanged. Electronically Signed   By: Rolm Baptise M.D.   On: 05/12/2019 11:16    ASSESSMENT / PLAN:  64 yo morbidly obese with progressive b/l infiltrates COVID-19 infection/pneumonia with progressive and severe hypoxia with ASTHMA exacerbation/ARDS, OSA  Severe ACUTE Hypoxic and Hypercapnic Respiratory Failure -continue Mechanical Ventilator support -continue Bronchodilator Therapy -Wean Fio2 and PEEP as tolerated -VAP/VENT bundle implementation   SEVERE ARDS/SUB cutaneous emphysema continue vent support IV steroids  Severe COVID-19 infection, ARDS and pneumonia/pneumonitis Continue IV steroids  Pulmonary hygiene Continue proning as tolerated due to severe hypoxia   Maintain airborne and contact precautions     GI GI PROPHYLAXIS as indicated  NUTRITIONAL STATUS DIET-->TF's as tolerated Constipation protocol as indicated ELECTROLYTES -follow labs as needed -replace as needed -pharmacy consultation and following   NEUROLOGY - intubated and sedated - minimal sedation to achieve a RASS goal: -1     DVT/GI PRX ordered TRANSFUSIONS AS NEEDED MONITOR FSBS ASSESS the need for LABS as needed   Critical Care Time devoted to patient care services described in this note is 35 minutes.   Overall, patient is critically ill, prognosis is guarded.    Corrin Parker, M.D.  [facility]  Medical Director [place] Director [facility] Cardio-Pulmonary Department

## 2019-05-13 NOTE — Progress Notes (Signed)
Nutrition Follow Up Note   DOCUMENTATION CODES:   Morbid obesity  INTERVENTION:   Once tube feeds initiated, recommend:  Vital 1.2 @ 24ml/hr  Propofol: 18.71 ml/hr- provides 494kcal/day   Free water flushes 49ml q4 hours to maintain tube patency   Regimen provides 1934kcal/day, 90g/day protein, 1142ml/day free water  Pt likely at moderate refeed risk; recommend monitor K, Mg and P labs for 3 days after tube feed initiation.   NUTRITION DIAGNOSIS:   Increased nutrient needs related to catabolic illness(COVID-19) as evidenced by estimated needs.  GOAL:   Provide needs based on ASPEN/SCCM guidelines  MONITOR:   Vent status, Labs, Weight trends, Skin, I & O's  ASSESSMENT:   64 year old female with PMHx of asthma, HTN, sleep apnea, depression, anxiety, DM, hx hiatal hernia, arhtritis, esophageal spasm, dysphagia admitted with COVID-19 PNA (initially diagnosed 12/23).   Pt with respiratory failure requiring intubation 12/7. Pt currently sedated and ventilated. OGT in place. Pt with fair appetite and oral intake prior to intubation; pt eating 50% of meals. No new weight since admit; recommend daily weights. Plan is for transfer to [facility].   Medications reviewed and include: Vitamin C, aspirin, D3, pepcid, insulin, melatonin, solu-medrol, senokot, thiamine, zinc, doxycycline, propofol   Labs reviewed: K 4.2 wnl, BUN 29(H) Wbc- 13.8(H) cbgs- 141, 112, 95, 92, 97 x 24 hrs AIC 7.6(H)- 12/31  Patient is currently intubated on ventilator support MV: 8.9 L/min Temp (24hrs), Avg:97.8 F (36.6 C), Min:97 F (36.1 C), Max:98.2 F (36.8 C)  Propofol: 18.71 ml/hr- provides 494kcal/day   MAP- >60mmHg  UOP-  Diet Order:   Diet Order            Diet Carb Modified Fluid consistency: Thin; Room service appropriate? Yes  Diet effective now             EDUCATION NEEDS:   No education needs have been identified at this time  Skin:  Skin Assessment:  Reviewed RN Assessment  Last BM:  1/8- type 4  Height:   Ht Readings from Last 1 Encounters:  05/06/19 4\' 7"  (1.397 m)   Weight:   Wt Readings from Last 1 Encounters:  05/06/19 89.1 kg   Ideal Body Weight:  41.6 kg  BMI:  Body mass index is 45.65 kg/m.  Estimated Nutritional Needs:   Kcal:  1700-1900kcal/day  Protein:  90-100 grams  Fluid:  1.3-1.5L/day  07/04/19 MS, RD, LDN Pager #- [phone] Office#- [phone] After Hours Pager: [phone]

## 2019-05-13 NOTE — H&P (Signed)
NAME:  RAFAEL QUESADA, MRN:  [id number], DOB:  10-14-1955, LOS: 0 ADMISSION DATE:  05/07/2019, CONSULTATION DATE:  05/23/2019 REFERRING MD:  Mortimer Fries, CHIEF COMPLAINT:  Dyspnea   Brief History   64 y/o female with COVID 19 pneumonia admitted on 1/1 to [facility] developed progressive hypoxemia and dyspnea ultimately requiring intubation on 1/7, transferred to [facility] on 1/8.  History of present illness   This is a 64 y/o female with multiple medical problems who was admitted to [facility] on 1/1 with dyspnea.  She had been diagnosed on 12/23 by her PCP and had worsening symptoms so she came to the [facility] ED on 12/31.  She was diagnosed with COVID pneumonia and hypoxemic respiratory failure.  She was admitted to the ICU, treated with BIPAP, high flow nasal cannula, remdesivir and ivermectin.  Developed sub cutaneous emphysema on 1/3.  Intubated on 1/7.  Past Medical History  osa htn Morbid obesity gerd dm2 Cor pulmonale Asthma Thyroid nodule  Significant Hospital Events   1/1 admit [facility], bipap 1/3 sub q emphysema 1/7 intubated  Consults:  PCCM  Procedures:  ETT 1/7 >  R IJ CVL  1/7 >   Significant Diagnostic Tests:  1/2 LE Doppler > DVT left peroneal vein  Micro Data:  12/23 sars cov 2 > pos  Antimicrobials:  12/31 Remdesivir > 1/4 1/4 doxycycline> 1/8 1/5 Ivermectin > 1/6  Interim history/subjective:  As above  Objective   Blood pressure 134/77, pulse (!) 101, temperature 98.6 F (37 C), temperature source Oral, resp. rate (!) 34, height 4\' 7"  (1.397 m), weight 85.1 kg, SpO2 96 %.    Vent Mode: PRVC FiO2 (%):  [60 %-100 %] 60 % Set Rate:  [16 bmp] 16 bmp Vt Set:  [400 mL] 400 mL PEEP:  [10 cmH20] 10 cmH20 Plateau Pressure:  [30 cmH20-31 cmH20] 30 cmH20   Intake/Output Summary (Last 24 hours) at 05/29/2019 1653 Last data filed at 05/12/2019 1600 Gross per 24 hour  Intake --  Output 110 ml  Net -110 ml   Filed Weights   05/11/2019 1540  Weight: 85.1 kg     Examination:  General:  In bed on vent HENT: NCAT ETT in place PULM: wheezing, poor air movement B, vent supported breathing CV: RRR, no mgr GI: BS+, soft, nontender MSK: normal bulk and tone Neuro: sedated on vent  CXR > sub cutaneous emphysema, ett in place, no pneumothorax  Resolved Hospital Problem list     Assessment & Plan:  ARDS due to COVID 19 pneumonia> complicated by pneumomediastinum Continue mechanical ventilation per ARDS protocol Target TVol 6-8cc/kgIBW Target Plateau Pressure < 30cm H20 Target driving pressure less than 15 cm of water Target PaO2 55-65: titrate PEEP/FiO2 per protocol As long as PaO2 to FiO2 ratio is less than 1:150 position in prone position for 16 hours a day Check CVP daily if CVL in place Target CVP less than 4, diurese as necessary Ventilator associated pneumonia prevention protocol 1/8 adjust TVol to 8cc/kg, monitor plateau, prone, continue solumedrol, stop doxycycline  Asthma exacerbation Hypertonic saline neb duoneb scheduled Solumedrol LABA/ICS  Severe vent dyssynchrony Bolus sedation now Change to versed, fentanyl infusions Intermittent vecuronium Oral oxycodone and clonazepam  DVT lovenox bid  DM2 SSI  Best practice:  Diet: tube feeding Pain/Anxiety/Delirium protocol (if indicated): yes, RASS target -4 to -5 VAP protocol (if indicated): yes DVT prophylaxis: lovenox GI prophylaxis: Pantoprazole for stress ulcer prophylaxis Glucose control: SSI Mobility: bed rest Code Status: full Family  Communication: I updated her daughter Neysa Bonito at length on 1/8 Disposition: remain in ICU  Labs   CBC: Recent Labs  Lab 05/07/19 0514 05/08/19 0418 05/09/19 0546 05/10/19 0838 05/11/19 0808 05/12/19 0244 05/26/2019 0515  WBC 14.8* 12.9* 14.7* 21.5* 20.5* 18.4* 13.8*  NEUTROABS 13.1* 11.6* 13.6* 20.0*  --   --  12.5*  HGB 13.7 12.8 13.7 14.1 15.1* 13.8 12.7  HCT 44.1 38.8 41.5 44.3 48.1* 43.0 40.4  MCV 80.2 77.1* 76.6*  79.7* 80.4 78.9* 81.0  PLT 276 242 238 253 291 281 231    Basic Metabolic Panel: Recent Labs  Lab 05/07/19 0514 05/08/19 0418 05/09/19 0546 05/10/19 0838 05/11/19 0808 05/12/19 0244 05/30/2019 0515  NA 142 139 137 137 135 133* 137  K 3.8 4.0 4.3 4.0 3.7 3.8 4.2  CL 99 97* 93* 95* 96* 96* 97*  CO2 31 31 32 28 27 28 31   GLUCOSE 251* 242* 252* 251* 121* 151* 102*  BUN 31* 36* 42* 32* 31* 30* 29*  CREATININE 0.90 0.76 0.81 0.63 0.66 0.60 0.70  CALCIUM 8.8* 8.4* 8.8* 8.6* 8.6* 8.5* 8.0*  MG 2.3 2.6* 2.5* 2.3 2.1  --   --    GFR: Estimated Creatinine Clearance: 61.8 mL/min (by C-G formula based on SCr of 0.7 mg/dL). Recent Labs  Lab 05/06/19 1925 05/10/19 0838 05/11/19 0808 05/12/19 0244 05/23/2019 0515  WBC  --  21.5* 20.5* 18.4* 13.8*  LATICACIDVEN 1.7  --   --   --   --     Liver Function Tests: Recent Labs  Lab 05/07/19 0514 05/08/19 0418 05/09/19 0546 05/10/19 0838 05/10/2019 0515  AST 52* 36 31 27 29   ALT 33 28 26 25 31   ALKPHOS 88 90 107 107 79  BILITOT 0.8 0.7 0.8 0.8 0.7  PROT 6.9 6.1* 6.8 6.5 5.3*  ALBUMIN 3.0* 2.6* 2.9* 2.7* 2.2*   No results for input(s): LIPASE, AMYLASE in the last 168 hours. No results for input(s): AMMONIA in the last 168 hours.  ABG    Component Value Date/Time   PHART 7.47 (H) 05/12/2019 1700   PCO2ART 46 05/12/2019 1700   PO2ART 149 (H) 05/12/2019 1700   HCO3 33.5 (H) 05/12/2019 1700   O2SAT 99.4 05/12/2019 1700     Coagulation Profile: No results for input(s): INR, PROTIME in the last 168 hours.  Cardiac Enzymes: No results for input(s): CKTOTAL, CKMB, CKMBINDEX, TROPONINI in the last 168 hours.  HbA1C: Hgb A1c MFr Bld  Date/Time Value Ref Range Status  05/05/2019 12:48 PM 7.6 (H) 4.8 - 5.6 % Final    Comment:    (NOTE) Pre diabetes:          5.7%-6.4% Diabetes:              >6.4% Glycemic control for   <7.0% adults with diabetes   10/15/2017 12:37 PM 6.5 (H) 4.8 - 5.6 % Final    Comment:    (NOTE) Pre  diabetes:          5.7%-6.4% Diabetes:              >6.4% Glycemic control for   <7.0% adults with diabetes     CBG: Recent Labs  Lab 05/12/19 2340 06/03/2019 0355 05/08/2019 0909 05/12/2019 1243 05/08/2019 1558  GLUCAP 95 92 97 126* 109*    Review of Systems:   Cannot obtain, intubated  Past Medical History  She,  has a past medical history of Acid reflux, Allergic genetic state, Anxiety, Arthritis,  Asthma (1957), Breast microcalcification, mammographic (2014), Breast screening, unspecified, Carpal tunnel syndrome (1999), Cor pulmonale (HCC), DDD (degenerative disc disease), lumbar, Depression, Diabetes mellitus without complication (HCC) (2019), Environmental allergies, Esophageal spasm, Fecal smearing, Headache, Heart murmur, Hemorrhoids (2012), History of hiatal hernia, Hypertension, Lump in thyroid (2019), Morbid obesity (HCC), Osteoarthritis of spine with radiculopathy, lumbar region, Other dysphagia, Pain, PONV (postoperative nausea and vomiting), Primary osteoarthritis of left knee, Primary osteoarthritis of right hip, Right thyroid nodule, Shortness of breath dyspnea, and Sleep apnea.   Surgical History    Past Surgical History:  Procedure Laterality Date  . ANTERIOR FUSION CERVICAL SPINE    . BACK SURGERY  2010   C 5 & 6 fusion  . BREAST BIOPSY Left    stereo  . BREAST EXCISIONAL BIOPSY Left 2014   benign   . BUNIONECTOMY Left 1996   pin removed  . carpal tunnel--left hand  Left 2012  . CHOLECYSTECTOMY  2002  . COLONOSCOPY  2009   Dr. Mechele Collin  . COLONOSCOPY WITH PROPOFOL N/A 08/20/2016   Procedure: COLONOSCOPY WITH PROPOFOL;  Surgeon: Earline Mayotte, MD;  Location: [facility] ENDOSCOPY;  Service: Endoscopy;  Laterality: N/A;  . DILATION AND CURETTAGE OF UTERUS  1987  . ESOPHAGOGASTRODUODENOSCOPY (EGD) WITH PROPOFOL N/A 04/30/2018   Procedure: ESOPHAGOGASTRODUODENOSCOPY (EGD) WITH PROPOFOL;  Surgeon: Scot Jun, MD;  Location: [facility] ENDOSCOPY;  Service: Endoscopy;   Laterality: N/A;  . ganglion cyst removal  Left 1988  . JOINT REPLACEMENT Right 2016   TOTAL HIP ARTHROPLASTY by dr. Ernest Pine  . KNEE ARTHROPLASTY Right 10/28/2017   Procedure: COMPUTER ASSISTED TOTAL KNEE ARTHROPLASTY;  Surgeon: Donato Heinz, MD;  Location: [facility] ORS;  Service: Orthopedics;  Laterality: Right;  . KNEE SURGERY Left 2014   partial knee replacement by dr. Erin Sons  . left breast biopsy Left 2006   mammary glands swollen  . MOUTH LESION EXCISIONAL BIOPSY    . NASAL SEPTUM SURGERY  1975   deviated septum repaired  . neck injection Right 2014   block for inflammed nerve. repeated in 2018 on left side for same thing. done by dr. Lowella Dandy  . NOSE SURGERY  2001   clean out of nasal passages  . TONSILLECTOMY  1991   uvula removed also for OSA  . tonsilloadenoidectomy  2003   uvula also for OSA. titanium screw on bottom of lower jaw  . TOTAL HIP ARTHROPLASTY Right 02/14/2015   Procedure: TOTAL HIP ARTHROPLASTY;  Surgeon: Donato Heinz, MD;  Location: [facility] ORS;  Service: Orthopedics;  Laterality: Right;  . TUBAL LIGATION    . UPPER GI ENDOSCOPY  2009, 2015   Dr Mechele Collin / Dr Lemar Livings     Social History   reports that she quit smoking about 32 years ago. She has a 10.00 pack-year smoking history. She has never used smokeless tobacco. She reports current alcohol use. She reports that she does not use drugs.   Family History   Her family history includes Diabetes in her father; Heart disease in her father; Ovarian cancer in her maternal grandmother. There is no history of Breast cancer or Colon cancer.   Allergies Allergies  Allergen Reactions  . Citrus     Itchy throat   . Pork-Derived Products     Itchy throat   . Augmentin [Amoxicillin-Pot Clavulanate] Nausea And Vomiting    Has patient had a PCN reaction causing immediate rash, facial/tongue/throat swelling, SOB or lightheadedness with hypotension: No Has patient had a PCN reaction causing  severe rash involving  mucus membranes or skin necrosis: No Has patient had a PCN reaction that required hospitalization: No Has patient had a PCN reaction occurring within the last 10 years: Yes If all of the above answers are "NO", then may proceed with Cephalosporin use.   . Latex Other (See Comments)    Blisters NOTE: Latex IgE NEGATIVE (<0.10) Around mouth causes a rash  . Morphine And Related Nausea And Vomiting    Patient does not want morphine at all.  . Cefadroxil Other (See Comments)    Severe cramping in side. Felt like ribs were going to explode  . Other Other (See Comments)    Lettuce causes her to start with diverticulitis  . Pollen Extract Other (See Comments)    Trees, grass, mold, dust causes raspy voice, nasal irritation  . Tape Other (See Comments)    Blisters. Paper tape is acceptable     Home Medications  Prior to Admission medications   Medication Sig Start Date End Date Taking? Authorizing Provider  apixaban (ELIQUIS) 5 MG TABS tablet Take 1 tablet (5 mg total) by mouth 2 (two) times daily. May 17, 2019   Erin Fulling, MD  ascorbic acid (VITAMIN C) 500 MG tablet Take 1 tablet (500 mg total) by mouth 2 (two) times daily. May 17, 2019   Erin Fulling, MD  aspirin 81 MG chewable tablet Chew 1 tablet (81 mg total) by mouth daily. 17-May-2019   Erin Fulling, MD  chlorhexidine (PERIDEX) 0.12 % solution 15 mLs by Mouth Rinse route 2 (two) times daily. 05/17/19   Erin Fulling, MD  cholecalciferol (VITAMIN D) 25 MCG tablet Take 1 tablet (1,000 Units total) by mouth daily. 05-17-19   Erin Fulling, MD  doxycycline 100 mg in sodium chloride 0.9 % 250 mL Inject 100 mg into the vein every 12 (twelve) hours. May 17, 2019   Erin Fulling, MD  famotidine (PEPCID) 40 MG tablet Take 1 tablet (40 mg total) by mouth daily. May 17, 2019   Erin Fulling, MD  hydrOXYzine (ATARAX/VISTARIL) 10 MG tablet Take 1 tablet (10 mg total) by mouth 3 (three) times daily as needed for anxiety or nausea. 17-May-2019   Erin Fulling, MD  insulin aspart (NOVOLOG)  100 UNIT/ML injection Inject 0-15 Units into the skin every 4 (four) hours. 05-17-2019   Erin Fulling, MD  insulin detemir (LEVEMIR) 100 UNIT/ML injection Inject 0.15 mLs (15 Units total) into the skin 2 (two) times daily. 05-17-2019   Erin Fulling, MD  levalbuterol [facility] HFA) 45 MCG/ACT inhaler Inhale 2 puffs into the lungs every 6 (six) hours as needed for wheezing or shortness of breath. May 17, 2019   Erin Fulling, MD  lidocaine (LIDODERM) 5 % Place 1 patch onto the skin daily. Remove & Discard patch within 12 hours or as directed by MD 05-17-2019   Erin Fulling, MD  LORazepam (ATIVAN) 2 MG/ML injection Inject 0.5 mLs (1 mg total) into the vein every 4 (four) hours as needed for anxiety. 17-May-2019   Erin Fulling, MD  methocarbamol 500 mg in dextrose 5 % 50 mL Inject 500 mg into the vein every 6 (six) hours as needed. 05-17-19   Erin Fulling, MD  methylPREDNISolone sodium succinate (SOLU-MEDROL) 40 mg/mL injection Inject 1 mL (40 mg total) into the vein every 12 (twelve) hours. 05-17-19   Erin Fulling, MD  ondansetron (ZOFRAN) 4 MG/2ML SOLN injection Inject 2 mLs (4 mg total) into the vein every 8 (eight) hours as needed for nausea or vomiting. May 17, 2019   Erin Fulling, MD  thiamine  100 MG tablet Take 2 tablets (200 mg total) by mouth daily. 05/10/2019   Erin FullingKasa, Kurian, MD  thiamine 100 MG tablet Take 2 tablets (200 mg total) by mouth daily. 05/14/19   Erin FullingKasa, Kurian, MD     Critical care time: 45 minutes      Heber CarolinaBrent Miquan Tandon, MD [place] [facility] Pager: [phone] Cell: [phone] If no response, call [phone]

## 2019-05-13 NOTE — Progress Notes (Signed)
Placed patient in the prone position. ETT secured with tape. Myoplex pads placed on cheeks and forehead for skin protection. Arms placed in swimmers position. RNx3 RTx3 at bedside. No complications.

## 2019-05-13 NOTE — Discharge Summary (Signed)
Physician Discharge Summary  Patient ID: Deborah Miller MRN: [id number] DOB/AGE: 1955-08-10 64 y.o.  Admit date: 05/05/2019 Discharge date: 05/09/2019  Admission Diagnoses: COVID 19 infection/ARDS  Discharge Diagnoses:  Principal Problem:   Acute respiratory disease due to COVID-19 virus Active Problems:   Esophageal reflux   Asthma   HTN (hypertension)   Depression with anxiety   Lactic acidosis   Hypokalemia   Acute respiratory failure due to COVID-19 [facility])      BRIEF PATIENT DESCRIPTION:  64 yo female initially diagnosed with COVID-19 in the outpatient setting on 12/23 currently admitted with acute hypoxic respiratory failure secondary to COVID-19 pneumonia requiring HFNC and NRB.  SIGNIFICANT EVENTS/STUDIES:  12/31: Pt presented to the ER from PCP with worsening acute hypoxic respiratory failure secondary to COVID-19 pneumonia 01/1: Pt admitted to ICU on [place].  Later transitioned off Bipap to HFNC and NRB 01/3: severe hypoxia, high risk for intubation and death, subcu emphysema developing 01/4: No progress noted, started ivermectin protocol for late pulmonary phase COVID-19 01/5: Adjusted Levemir to twice a day, continues with high oxygen requirements however does not desaturate as easily 01/7: Subcu emphysema markedly increased.  Chest x-ray shows increased subcu emphysema, worsening respiratory failure, will require intubation.    Treatments:  64 yo morbidly obese with progressive b/l infiltrates COVID-19 infection/pneumonia with progressive and severe hypoxia with ASTHMA exacerbation/ARDS, OSA  Severe ACUTE Hypoxic and Hypercapnic Respiratory Failure -continue Mechanical Ventilator support -continue Bronchodilator Therapy -Wean Fio2 and PEEP as tolerated -VAP/VENT bundle implementation   SEVERE ARDS/SUB cutaneous emphysema continue vent support IV steroids  Severe COVID-19 infection, ARDS and pneumonia/pneumonitis Continue IV steroids  Pulmonary  hygiene Continue proning as tolerated due to severe hypoxia   Maintain airborne and contact precautions  Antioxidants and remdisivir  Discharge Exam: Blood pressure 112/77, pulse 100, temperature 98.2 F (36.8 C), temperature source Oral, resp. rate (!) 22, height 4\' 7"  (1.397 m), weight 89.1 kg, SpO2 100 %.  Disposition: TRANSFER TO [facility]   Allergies as of 05/17/2019      Reactions   Citrus    Itchy throat    Pork-derived Products    Itchy throat    Augmentin [amoxicillin-pot Clavulanate] Nausea And Vomiting   Has patient had a PCN reaction causing immediate rash, facial/tongue/throat swelling, SOB or lightheadedness with hypotension: No Has patient had a PCN reaction causing severe rash involving mucus membranes or skin necrosis: No Has patient had a PCN reaction that required hospitalization: No Has patient had a PCN reaction occurring within the last 10 years: Yes If all of the above answers are "NO", then may proceed with Cephalosporin use.   Latex Other (See Comments)   Blisters NOTE: Latex IgE NEGATIVE (<0.10) Around mouth causes a rash   Morphine And Related Nausea And Vomiting   Patient does not want morphine at all.   Cefadroxil Other (See Comments)   Severe cramping in side. Felt like ribs were going to explode   Other Other (See Comments)   Lettuce causes her to start with diverticulitis   Pollen Extract Other (See Comments)   Trees, grass, mold, dust causes raspy voice, nasal irritation   Tape Other (See Comments)   Blisters. Paper tape is acceptable      Medication List    STOP taking these medications   acetaminophen 500 MG tablet Commonly known as: TYLENOL   Advair HFA 115-21 MCG/ACT inhaler Generic drug: fluticasone-salmeterol   albuterol 108 (90 Base) MCG/ACT inhaler Commonly known as: VENTOLIN HFA  ALPRAZolam 0.25 MG tablet Commonly known as: XANAX   Auvi-Q 0.3 mg/0.3 mL Soaj injection Generic drug: EPINEPHrine   b complex vitamins tablet    CALCIUM 600 + D PO   calcium carbonate 500 MG chewable tablet Commonly known as: TUMS - dosed in mg elemental calcium   cetirizine 10 MG tablet Commonly known as: ZYRTEC   cholestyramine light 4 GM/DOSE powder Commonly known as: PREVALITE   clarithromycin 500 MG tablet Commonly known as: BIAXIN   CORICIDIN COUGH/COLD 4-30 MG Tabs Generic drug: Chlorpheniramine-DM   docusate sodium 100 MG capsule Commonly known as: COLACE   gabapentin 300 MG capsule Commonly known as: NEURONTIN   L-Lysine 500 MG Caps   Melatonin 5 MG Tabs   Mobic 15 MG tablet Generic drug: meloxicam   multivitamin tablet   omeprazole 20 MG capsule Commonly known as: PRILOSEC   polyvinyl alcohol 1.4 % ophthalmic solution Commonly known as: LIQUIFILM TEARS   potassium chloride 10 MEQ tablet Commonly known as: KLOR-CON   pramipexole 0.125 MG tablet Commonly known as: MIRAPEX   predniSONE 20 MG tablet Commonly known as: DELTASONE   sodium chloride 0.65 % Soln nasal spray Commonly known as: OCEAN   Spiriva HandiHaler 18 MCG inhalation capsule Generic drug: tiotropium   torsemide 20 MG tablet Commonly known as: DEMADEX   traMADol 50 MG tablet Commonly known as: ULTRAM     TAKE these medications   apixaban 5 MG Tabs tablet Commonly known as: ELIQUIS Take 1 tablet (5 mg total) by mouth 2 (two) times daily.   ascorbic acid 500 MG tablet Commonly known as: VITAMIN C Take 1 tablet (500 mg total) by mouth 2 (two) times daily.   aspirin 81 MG chewable tablet Chew 1 tablet (81 mg total) by mouth daily.   chlorhexidine 0.12 % solution Commonly known as: PERIDEX 15 mLs by Mouth Rinse route 2 (two) times daily.   doxycycline 100 mg in sodium chloride 0.9 % 250 mL Inject 100 mg into the vein every 12 (twelve) hours.   famotidine 40 MG tablet Commonly known as: PEPCID Take 1 tablet (40 mg total) by mouth daily.   hydrOXYzine 10 MG tablet Commonly known as: ATARAX/VISTARIL Take 1  tablet (10 mg total) by mouth 3 (three) times daily as needed for anxiety or nausea.   insulin aspart 100 UNIT/ML injection Commonly known as: novoLOG Inject 0-15 Units into the skin every 4 (four) hours.   insulin detemir 100 UNIT/ML injection Commonly known as: LEVEMIR Inject 0.15 mLs (15 Units total) into the skin 2 (two) times daily.   levalbuterol 45 MCG/ACT inhaler Commonly known as: XOPENEX HFA Inhale 2 puffs into the lungs every 6 (six) hours as needed for wheezing or shortness of breath.   lidocaine 5 % Commonly known as: LIDODERM Place 1 patch onto the skin daily. Remove & Discard patch within 12 hours or as directed by MD What changed: additional instructions   LORazepam 2 MG/ML injection Commonly known as: ATIVAN Inject 0.5 mLs (1 mg total) into the vein every 4 (four) hours as needed for anxiety.   methocarbamol 500 mg in dextrose 5 % 50 mL Inject 500 mg into the vein every 6 (six) hours as needed.   methylPREDNISolone sodium succinate 40 mg/mL injection Commonly known as: SOLU-MEDROL Inject 1 mL (40 mg total) into the vein every 12 (twelve) hours.   ondansetron 4 MG/2ML Soln injection Commonly known as: ZOFRAN Inject 2 mLs (4 mg total) into the vein every 8 (eight)  hours as needed for nausea or vomiting.   thiamine 100 MG tablet Take 2 tablets (200 mg total) by mouth daily.   thiamine 100 MG tablet Take 2 tablets (200 mg total) by mouth daily. Start taking on: May 14, 2019   Vitamin D3 25 MCG tablet Commonly known as: Vitamin D Take 1 tablet (1,000 Units total) by mouth daily.        Signed: Erin Fulling June 09, 2019, 8:13 AM

## 2019-05-13 NOTE — Progress Notes (Addendum)
1545: pt arrived via EMS, moved to bed and placed on bedside monitor, VS obtained. Pt on prop, but opening eyes and moving spontaneously and following commands. Significant subq emphysema along chest and down arms. Slight paradoxic breathing. Per provider at bedside, going to add fentanyl and work on sedating her more for compliance. Will monitor  1640: Daughter Neysa Bonito updated via phone. All questions answered. Neysa Bonito is to be spokesperson for family and the only contact person for updates.

## 2019-05-13 NOTE — Progress Notes (Signed)
ANTICOAGULATION CONSULT NOTE  Pharmacy Consult for Enoxaparin Indication: DVT  Patient Measurements: Height: 4\' 7"  (139.7 cm) Weight: 187 lb 9.8 oz (85.1 kg) IBW/kg (Calculated) : 34  Vital Signs: Temp: 98.6 F (37 C) (01/08 1540) Temp Source: Oral (01/08 1540) BP: 134/77 (01/08 1600) Pulse Rate: 101 (01/08 1600)  Labs: Recent Labs    05/11/19 0808 05/12/19 0244 05/25/2019 0515  HGB 15.1* 13.8 12.7  HCT 48.1* 43.0 40.4  PLT 291 281 231  CREATININE 0.66 0.60 0.70    Estimated Creatinine Clearance: 61.8 mL/min (by C-G formula based on SCr of 0.7 mg/dL).   Medical History: Past Medical History:  Diagnosis Date  . Acid reflux   . Allergic genetic state   . Anxiety   . Arthritis   . Asthma 1957  . Breast microcalcification, mammographic 2014  . Breast screening, unspecified   . Carpal tunnel syndrome 1999   repaired left wrist  . Cor pulmonale (HCC)   . DDD (degenerative disc disease), lumbar   . Depression   . Diabetes mellitus without complication (HCC) 2019   borderline, follows diabetic diet often  . Environmental allergies   . Esophageal spasm   . Fecal smearing   . Headache    sinus migraines. takes OTC meds for allergies  . Heart murmur    as a child, not treated  . Hemorrhoids 2012  . History of hiatal hernia   . Hypertension   . Lump in thyroid 2019   having an ultrasound 10/2017 to rule out tumor or salivary gland enlargement  . Morbid obesity (HCC)   . Osteoarthritis of spine with radiculopathy, lumbar region   . Other dysphagia   . Pain    chronic lbp,stenosis  . PONV (postoperative nausea and vomiting)    nausea and vomitting due to morphine  . Primary osteoarthritis of left knee   . Primary osteoarthritis of right hip   . Right thyroid nodule   . Shortness of breath dyspnea    increased with anxiety  . Sleep apnea    cpap   Assessment: Patient is a 64yo female being treated for Covid. Now with new DVT. Venous ultrasound revealed an  occlusive DVT involving the left peroneal vein. Of note patient was transitioned to apixaban at [facility] but now switching back to Lovenox. Last dose of apixaban was this AM at 0915. H/H and Plt wnl. Scr wnl   Plan:   Restart enoxaparin 85mg  (1mg /kg) SQ q12h. First dose tonight.   BMP, CBC in am  [facility], PharmD., BCPS Clinical Pharmacist Clinical phone for 05/12/18 until 5pm: [phone]

## 2019-05-14 ENCOUNTER — Inpatient Hospital Stay (HOSPITAL_COMMUNITY): Payer: BC Managed Care – PPO

## 2019-05-14 LAB — POCT I-STAT 7, (LYTES, BLD GAS, ICA,H+H)
Acid-Base Excess: 4 mmol/L — ABNORMAL HIGH (ref 0.0–2.0)
Bicarbonate: 33.9 mmol/L — ABNORMAL HIGH (ref 20.0–28.0)
Calcium, Ion: 1.26 mmol/L (ref 1.15–1.40)
HCT: 38 % (ref 36.0–46.0)
Hemoglobin: 12.9 g/dL (ref 12.0–15.0)
O2 Saturation: 96 %
Patient temperature: 98
Potassium: 4.9 mmol/L (ref 3.5–5.1)
Sodium: 133 mmol/L — ABNORMAL LOW (ref 135–145)
TCO2: 36 mmol/L — ABNORMAL HIGH (ref 22–32)
pCO2 arterial: 77.9 mmHg (ref 32.0–48.0)
pH, Arterial: 7.245 — ABNORMAL LOW (ref 7.350–7.450)
pO2, Arterial: 100 mmHg (ref 83.0–108.0)

## 2019-05-14 LAB — CBC
HCT: 41.5 % (ref 36.0–46.0)
Hemoglobin: 12.5 g/dL (ref 12.0–15.0)
MCH: 25.6 pg — ABNORMAL LOW (ref 26.0–34.0)
MCHC: 30.1 g/dL (ref 30.0–36.0)
MCV: 84.9 fL (ref 80.0–100.0)
Platelets: 192 10*3/uL (ref 150–400)
RBC: 4.89 MIL/uL (ref 3.87–5.11)
RDW: 14.8 % (ref 11.5–15.5)
WBC: 14.3 10*3/uL — ABNORMAL HIGH (ref 4.0–10.5)
nRBC: 0 % (ref 0.0–0.2)

## 2019-05-14 LAB — BASIC METABOLIC PANEL
Anion gap: 11 (ref 5–15)
BUN: 33 mg/dL — ABNORMAL HIGH (ref 8–23)
CO2: 28 mmol/L (ref 22–32)
Calcium: 8.3 mg/dL — ABNORMAL LOW (ref 8.9–10.3)
Chloride: 96 mmol/L — ABNORMAL LOW (ref 98–111)
Creatinine, Ser: 0.72 mg/dL (ref 0.44–1.00)
GFR calc Af Amer: >60 mL/min (ref >60)
GFR calc non Af Amer: >60 mL/min (ref >60)
Glucose, Bld: 261 mg/dL — ABNORMAL HIGH (ref 70–99)
Potassium: 4.5 mmol/L (ref 3.5–5.1)
Sodium: 135 mmol/L (ref 135–145)

## 2019-05-14 LAB — D-DIMER, QUANTITATIVE: D-Dimer, Quant: 2.17 ug/mL-FEU — ABNORMAL HIGH (ref 0.00–0.50)

## 2019-05-14 LAB — GLUCOSE, CAPILLARY
Glucose-Capillary: 218 mg/dL — ABNORMAL HIGH (ref 70–99)
Glucose-Capillary: 222 mg/dL — ABNORMAL HIGH (ref 70–99)
Glucose-Capillary: 232 mg/dL — ABNORMAL HIGH (ref 70–99)
Glucose-Capillary: 267 mg/dL — ABNORMAL HIGH (ref 70–99)
Glucose-Capillary: 272 mg/dL — ABNORMAL HIGH (ref 70–99)
Glucose-Capillary: 280 mg/dL — ABNORMAL HIGH (ref 70–99)
Glucose-Capillary: 291 mg/dL — ABNORMAL HIGH (ref 70–99)

## 2019-05-14 LAB — MAGNESIUM
Magnesium: 2.4 mg/dL (ref 1.7–2.4)
Magnesium: 2.5 mg/dL — ABNORMAL HIGH (ref 1.7–2.4)

## 2019-05-14 LAB — PHOSPHORUS
Phosphorus: 5.3 mg/dL — ABNORMAL HIGH (ref 2.5–4.6)
Phosphorus: 4.1 mg/dL (ref 2.5–4.6)

## 2019-05-14 MED ORDER — VITAL 1.5 CAL PO LIQD
1000.0000 mL | ORAL | Status: DC
  Administered 2019-05-14 – 2019-05-21 (×3): 1000 mL

## 2019-05-14 MED ORDER — PANTOPRAZOLE SODIUM 40 MG PO PACK
40.0000 mg | PACK | Freq: Every day | ORAL | Status: DC
  Administered 2019-05-15 – 2019-05-23 (×9): 40 mg
  Filled 2019-05-14 (×10): qty 20

## 2019-05-14 MED ORDER — CHLORHEXIDINE GLUCONATE CLOTH 2 % EX PADS
6.0000 | MEDICATED_PAD | Freq: Every day | CUTANEOUS | Status: DC
  Administered 2019-05-14 – 2019-05-24 (×11): 6 via TOPICAL

## 2019-05-14 MED ORDER — PRO-STAT SUGAR FREE PO LIQD
30.0000 mL | Freq: Every day | ORAL | Status: DC
  Administered 2019-05-14 – 2019-05-22 (×9): 30 mL
  Filled 2019-05-14 (×8): qty 30

## 2019-05-14 NOTE — Progress Notes (Signed)
After increasing sedation and a PRN dose of Vecuronium, patient was synchronous with the vent. At this time 2100, patient was placed in the prone position. Patient is on 90%, 10 PEEP. Patient tolerated proning well. There were no other significant events overnight.

## 2019-05-14 NOTE — Progress Notes (Signed)
Initial Nutrition Assessment  DOCUMENTATION CODES:   Morbid obesity  INTERVENTION:  Vital 1.5 @ 50 ml/hr Prostat 30 ml daily via tube  Regimen provides 1900 kcal, 96 grams protein, 912 ml free water from formula  200 ml free water flushes every 4 hrs per MD  Pt likely at moderate refeed risk; recommend monitor K, Mg and P labs for 3 days after tube feed initiation.   NUTRITION DIAGNOSIS:   Increased nutrient needs related to catabolic illness(COVID 19) as evidenced by estimated needs.  GOAL:   Provide needs based on ASPEN/SCCM guidelines    MONITOR:   Weight trends, TF tolerance, Vent status, I & O's, Labs  REASON FOR ASSESSMENT:   Consult Enteral/tube feeding initiation and management  ASSESSMENT:  64 year old female with past medical history of OSA, HTN, GERD, T2DM, asthma, and morbid obesity who was diagnosed with COVID 19 by PCP on 12/23. Patient presented to [facility] on 12/[facility] due to worsening symptoms where she was admitted to ICU for COVID pneumonia and hypoxemic respiratory failure.  1/3 - developed sub cutaneous emphysema 1/7 - intubated 1/8 - transfer to [facility]  Per chart, pt with fair appetite and oral intake prior to intubation; eating 50% of meals.   Generalized +2 edema per review of RN flowsheet.  Patient is currently intubated on ventilator support MV: 6.9 L/min Temp (24hrs), Avg:98.3 F (36.8 C), Min:97.5 F (36.4 C), Max:98.7 F (37.1 C)  Not on Propofol  Medications reviewed and include: Vit C 500 mg BID, SS novolog, Levemir 15 units BID, Methylprednisolone, Oxycodone, Protonix, Thiamine, Vit D3 Drips: Lactated ringers Fentanyl 20 ml/hr Versed 6 ml/hr  Free water 200 ml every 4 hrs  Labs: CBGs 218-272 x 24 hrs, BUN 33 (H), PO4 5.3 (H), WBC 14.3 (H), D-dimer 2.17 (H) K/Mg (WNL)  NUTRITION - FOCUSED PHYSICAL EXAM: Unable to complete at this time, RD working remotely.   Diet Order:   Diet Order    None      EDUCATION NEEDS:   No  education needs have been identified at this time  Skin:  Skin Assessment: Reviewed RN Assessment  Last BM:  1/9 (type 5)  Height:   Ht Readings from Last 1 Encounters:  05/30/2019 4\' 7"  (1.397 m)    Weight:   Wt Readings from Last 1 Encounters:  05/14/19 87.9 kg    Ideal Body Weight:  41.6 kg  BMI:  Body mass index is 45.04 kg/m.  Estimated Nutritional Needs:   Kcal:  1800-2000  Protein:  83-104  Fluid:  > 1.7 L/day   07/12/19, RD, LDN Clinical Nutrition [place] Telephone [phone] After Hours/Weekend Pager: [phone]

## 2019-05-14 NOTE — Progress Notes (Signed)
NAME:  IDELLA LAMONTAGNE, MRN:  [id number], DOB:  03-May-1956, LOS: 1 ADMISSION DATE:  2019-06-04, CONSULTATION DATE:  2019-06-04 REFERRING MD:  Mortimer Fries, CHIEF COMPLAINT:  Dyspnea   Brief History   64 y/o female with COVID 19 pneumonia admitted on 1/1 to [facility] developed progressive hypoxemia and dyspnea ultimately requiring intubation on 1/7, transferred to [facility] on 1/8.  History of present illness   This is a 64 y/o female with multiple medical problems who was admitted to [facility] on 1/1 with dyspnea.  She had been diagnosed on 12/23 by her PCP and had worsening symptoms so she came to the [facility] ED on 12/31.  She was diagnosed with COVID pneumonia and hypoxemic respiratory failure.  She was admitted to the ICU, treated with BIPAP, high flow nasal cannula, remdesivir and ivermectin.  Developed sub cutaneous emphysema on 1/3.  Intubated on 1/7.  Past Medical History  osa htn Morbid obesity gerd dm2 Cor pulmonale Asthma Thyroid nodule  Significant Hospital Events   1/1 admit [facility], bipap 1/3 sub q emphysema 1/7 intubated  Consults:  PCCM  Procedures:  ETT 1/7 >  R IJ CVL  1/7 >   Significant Diagnostic Tests:  1/2 LE Doppler > DVT left peroneal vein  Micro Data:  12/23 sars cov 2 > pos  Antimicrobials:  12/31 Remdesivir > 1/4 1/4 doxycycline> 1/8 1/5 Ivermectin > 1/6  Interim history/subjective:  No acute events o/n; prone this a.m.  Objective   Blood pressure 123/72, pulse (!) 108, temperature 97.9 F (36.6 C), temperature source Axillary, resp. rate (!) 25, height 4\' 7"  (1.397 m), weight 87.9 kg, SpO2 92 %.    Vent Mode: PRVC FiO2 (%):  [60 %-100 %] 90 % Set Rate:  [25 bmp] 25 bmp Vt Set:  [270 mL] 270 mL PEEP:  [10 cmH20] 10 cmH20 Plateau Pressure:  [17 cmH20-26 cmH20] 25 cmH20   Intake/Output Summary (Last 24 hours) at 05/14/2019 1404 Last data filed at 05/14/2019 1200 Gross per 24 hour  Intake 1381.31 ml  Output 700 ml  Net 681.31 ml   Filed Weights   2019-06-04 1540 05/14/19 0500  Weight: 85.1 kg 87.9 kg    Examination:  General: ill appearing, NAD HENT: [place]/AT ETT in place PULM: wheezing, distant breath sounds, vent supported breathing CV: RRR, no r/m/g GI: BS+, soft, nontender MSK: obese; o/w normal bulk and tone Extrem: no c/c/e Neuro: sedated on vent; PERRLA, midline  CXR personally reviewed > 1/9 sub cutaneous emphysema, ett in place (will pull back 1 cm or so), worsening diffuse opacities compared to 1/8  Resolved Hospital Problem list     Assessment & Plan:  ARDS due to COVID 19 pneumonia> complicated by pneumomediastinum Continue mechanical ventilation per ARDS protocol Target TVol 6-8cc/kg IBW Target Plateau Pressure < 30cm H20 Target driving pressure less than 15 cm of water Target PaO2 55-65: titrate PEEP/FiO2 per protocol As long as PaO2 to FiO2 ratio is less than 1:150 position in prone position for 16 hours a day Check CVP daily if CVL in place Target CVP less than 4, diurese as necessary Ventilator associated pneumonia prevention protocol 1/8 adjust TVol to 8cc/kg, monitor plateau, prone, continue solumedrol,   Asthma exacerbation Hypertonic saline neb duoneb scheduled Solumedrol LABA/ICS  Severe vent dyssynchrony  --Sedation with versed, fentanyl infusions --Intermittent vecuronium --Oral oxycodone and clonazepam  DVT lovenox bid  DM2 SSI  Best practice:  Diet: tube feeding Pain/Anxiety/Delirium protocol (if indicated): yes, RASS target -4 to -5  VAP protocol (if indicated): yes DVT prophylaxis: lovenox GI prophylaxis: Pantoprazole for stress ulcer prophylaxis Glucose control: SSI Mobility: bed  Code Status: full Family Communication: family aware Disposition:  ICU  Labs   CBC: Recent Labs  Lab 05/08/19 0418 05/09/19 0546 05/10/19 7353 05/11/19 0808 05/12/19 0244 06-05-2019 0515 06/05/19 1811 05/14/19 0405  WBC 12.9* 14.7* 21.5* 20.5* 18.4* 13.8*  --  14.3*  NEUTROABS 11.6*  13.6* 20.0*  --   --  12.5*  --   --   HGB 12.8 13.7 14.1 15.1* 13.8 12.7 13.6 12.5  HCT 38.8 41.5 44.3 48.1* 43.0 40.4 40.0 41.5  MCV 77.1* 76.6* 79.7* 80.4 78.9* 81.0  --  84.9  PLT 242 238 253 291 281 231  --  192    Basic Metabolic Panel: Recent Labs  Lab 05/09/19 0546 05/10/19 0838 05/11/19 0808 05/12/19 0244 06-05-19 0515 June 05, 2019 1751 2019/06/05 1811 05/14/19 0405  NA 137 137 135 133* 137  --  135 135  K 4.3 4.0 3.7 3.8 4.2  --  4.5 4.5  CL 93* 95* 96* 96* 97*  --   --  96*  CO2 32 28 27 28 31   --   --  28  GLUCOSE 252* 251* 121* 151* 102*  --   --  261*  BUN 42* 32* 31* 30* 29*  --   --  33*  CREATININE 0.81 0.63 0.66 0.60 0.70  --   --  0.72  CALCIUM 8.8* 8.6* 8.6* 8.5* 8.0*  --   --  8.3*  MG 2.5* 2.3 2.1  --   --  2.2  --  2.4  PHOS  --   --   --   --   --  3.8  --  5.3*   GFR: Estimated Creatinine Clearance: 63.2 mL/min (by C-G formula based on SCr of 0.72 mg/dL). Recent Labs  Lab 05/11/19 0808 05/12/19 0244 06-05-2019 0515 05/14/19 0405  WBC 20.5* 18.4* 13.8* 14.3*    Liver Function Tests: Recent Labs  Lab 05/08/19 0418 05/09/19 0546 05/10/19 0838 June 05, 2019 0515  AST 36 31 27 29   ALT 28 26 25 31   ALKPHOS 90 107 107 79  BILITOT 0.7 0.8 0.8 0.7  PROT 6.1* 6.8 6.5 5.3*  ALBUMIN 2.6* 2.9* 2.7* 2.2*   No results for input(s): LIPASE, AMYLASE in the last 168 hours. No results for input(s): AMMONIA in the last 168 hours.  ABG    Component Value Date/Time   PHART 7.374 06/05/19 1811   PCO2ART 60.9 (H) 06/05/2019 1811   PO2ART 44.0 (L) 2019-06-05 1811   HCO3 35.6 (H) 06-05-19 1811   TCO2 37 (H) 2019-06-05 1811   O2SAT 77.0 Jun 05, 2019 1811     Coagulation Profile: No results for input(s): INR, PROTIME in the last 168 hours.  Cardiac Enzymes: No results for input(s): CKTOTAL, CKMB, CKMBINDEX, TROPONINI in the last 168 hours.  HbA1C: Hgb A1c MFr Bld  Date/Time Value Ref Range Status  05/05/2019 12:48 PM 7.6 (H) 4.8 - 5.6 % Final     Comment:    (NOTE) Pre diabetes:          5.7%-6.4% Diabetes:              >6.4% Glycemic control for   <7.0% adults with diabetes   10/15/2017 12:37 PM 6.5 (H) 4.8 - 5.6 % Final    Comment:    (NOTE) Pre diabetes:          5.7%-6.4% Diabetes:              >  6.4% Glycemic control for   <7.0% adults with diabetes     CBG: Recent Labs  Lab 05/21/2019 1933 05/14/19 0017 05/14/19 0404 05/14/19 0738 05/14/19 1129  GLUCAP 135* 218* 272* 222* 232*    Review of Systems:   Cannot obtain, intubated  Past Medical History  She,  has a past medical history of Acid reflux, Allergic genetic state, Anxiety, Arthritis, Asthma (1957), Breast microcalcification, mammographic (2014), Breast screening, unspecified, Carpal tunnel syndrome (1999), Cor pulmonale (HCC), DDD (degenerative disc disease), lumbar, Depression, Diabetes mellitus without complication (HCC) (2019), Environmental allergies, Esophageal spasm, Fecal smearing, Headache, Heart murmur, Hemorrhoids (2012), History of hiatal hernia, Hypertension, Lump in thyroid (2019), Morbid obesity (HCC), Osteoarthritis of spine with radiculopathy, lumbar region, Other dysphagia, Pain, PONV (postoperative nausea and vomiting), Primary osteoarthritis of left knee, Primary osteoarthritis of right hip, Right thyroid nodule, Shortness of breath dyspnea, and Sleep apnea.   Surgical History    Past Surgical History:  Procedure Laterality Date  . ANTERIOR FUSION CERVICAL SPINE    . BACK SURGERY  2010   C 5 & 6 fusion  . BREAST BIOPSY Left    stereo  . BREAST EXCISIONAL BIOPSY Left 2014   benign   . BUNIONECTOMY Left 1996   pin removed  . carpal tunnel--left hand  Left 2012  . CHOLECYSTECTOMY  2002  . COLONOSCOPY  2009   Dr. Mechele Collin  . COLONOSCOPY WITH PROPOFOL N/A 08/20/2016   Procedure: COLONOSCOPY WITH PROPOFOL;  Surgeon: Earline Mayotte, MD;  Location: [facility] ENDOSCOPY;  Service: Endoscopy;  Laterality: N/A;  . DILATION AND CURETTAGE OF  UTERUS  1987  . ESOPHAGOGASTRODUODENOSCOPY (EGD) WITH PROPOFOL N/A 04/30/2018   Procedure: ESOPHAGOGASTRODUODENOSCOPY (EGD) WITH PROPOFOL;  Surgeon: Scot Jun, MD;  Location: [facility] ENDOSCOPY;  Service: Endoscopy;  Laterality: N/A;  . ganglion cyst removal  Left 1988  . JOINT REPLACEMENT Right 2016   TOTAL HIP ARTHROPLASTY by dr. Ernest Pine  . KNEE ARTHROPLASTY Right 10/28/2017   Procedure: COMPUTER ASSISTED TOTAL KNEE ARTHROPLASTY;  Surgeon: Donato Heinz, MD;  Location: [facility] ORS;  Service: Orthopedics;  Laterality: Right;  . KNEE SURGERY Left 2014   partial knee replacement by dr. Erin Sons  . left breast biopsy Left 2006   mammary glands swollen  . MOUTH LESION EXCISIONAL BIOPSY    . NASAL SEPTUM SURGERY  1975   deviated septum repaired  . neck injection Right 2014   block for inflammed nerve. repeated in 2018 on left side for same thing. done by dr. Lowella Dandy  . NOSE SURGERY  2001   clean out of nasal passages  . TONSILLECTOMY  1991   uvula removed also for OSA  . tonsilloadenoidectomy  2003   uvula also for OSA. titanium screw on bottom of lower jaw  . TOTAL HIP ARTHROPLASTY Right 02/14/2015   Procedure: TOTAL HIP ARTHROPLASTY;  Surgeon: Donato Heinz, MD;  Location: [facility] ORS;  Service: Orthopedics;  Laterality: Right;  . TUBAL LIGATION    . UPPER GI ENDOSCOPY  2009, 2015   Dr Mechele Collin / Dr Lemar Livings     Social History   reports that she quit smoking about 32 years ago. She has a 10.00 pack-year smoking history. She has never used smokeless tobacco. She reports current alcohol use. She reports that she does not use drugs.   Family History   Her family history includes Diabetes in her father; Heart disease in her father; Ovarian cancer in her maternal grandmother. There is no history of Breast  cancer or Colon cancer.   Allergies Allergies  Allergen Reactions  . Citrus     Itchy throat   . Pork-Derived Products     Itchy throat   . Augmentin [Amoxicillin-Pot  Clavulanate] Nausea And Vomiting    Has patient had a PCN reaction causing immediate rash, facial/tongue/throat swelling, SOB or lightheadedness with hypotension: No Has patient had a PCN reaction causing severe rash involving mucus membranes or skin necrosis: No Has patient had a PCN reaction that required hospitalization: No Has patient had a PCN reaction occurring within the last 10 years: Yes If all of the above answers are "NO", then may proceed with Cephalosporin use.   . Latex Other (See Comments)    Blisters NOTE: Latex IgE NEGATIVE (<0.10) Around mouth causes a rash  . Morphine And Related Nausea And Vomiting    Patient does not want morphine at all.  . Cefadroxil Other (See Comments)    Severe cramping in side. Felt like ribs were going to explode  . Other Other (See Comments)    Lettuce causes her to start with diverticulitis  . Pollen Extract Other (See Comments)    Trees, grass, mold, dust causes raspy voice, nasal irritation  . Tape Other (See Comments)    Blisters. Paper tape is acceptable     Home Medications  Prior to Admission medications   Medication Sig Start Date End Date Taking? Authorizing Provider  apixaban (ELIQUIS) 5 MG TABS tablet Take 1 tablet (5 mg total) by mouth 2 (two) times daily. 05/14/2019   Erin FullingKasa, Kurian, MD  ascorbic acid (VITAMIN C) 500 MG tablet Take 1 tablet (500 mg total) by mouth 2 (two) times daily. 06/02/2019   Erin FullingKasa, Kurian, MD  aspirin 81 MG chewable tablet Chew 1 tablet (81 mg total) by mouth daily. 06/01/2019   Erin FullingKasa, Kurian, MD  chlorhexidine (PERIDEX) 0.12 % solution 15 mLs by Mouth Rinse route 2 (two) times daily. 05/18/2019   Erin FullingKasa, Kurian, MD  cholecalciferol (VITAMIN D) 25 MCG tablet Take 1 tablet (1,000 Units total) by mouth daily. 05/25/2019   Erin FullingKasa, Kurian, MD  doxycycline 100 mg in sodium chloride 0.9 % 250 mL Inject 100 mg into the vein every 12 (twelve) hours. 05/09/2019   Erin FullingKasa, Kurian, MD  famotidine (PEPCID) 40 MG tablet Take 1 tablet (40 mg total) by  mouth daily. 05/23/2019   Erin FullingKasa, Kurian, MD  hydrOXYzine (ATARAX/VISTARIL) 10 MG tablet Take 1 tablet (10 mg total) by mouth 3 (three) times daily as needed for anxiety or nausea. 05/08/2019   Erin FullingKasa, Kurian, MD  insulin aspart (NOVOLOG) 100 UNIT/ML injection Inject 0-15 Units into the skin every 4 (four) hours. 05/08/2019   Erin FullingKasa, Kurian, MD  insulin detemir (LEVEMIR) 100 UNIT/ML injection Inject 0.15 mLs (15 Units total) into the skin 2 (two) times daily. 05/11/2019   Erin FullingKasa, Kurian, MD  levalbuterol [facility](XOPENEX HFA) 45 MCG/ACT inhaler Inhale 2 puffs into the lungs every 6 (six) hours as needed for wheezing or shortness of breath. 05/27/2019   Erin FullingKasa, Kurian, MD  lidocaine (LIDODERM) 5 % Place 1 patch onto the skin daily. Remove & Discard patch within 12 hours or as directed by MD 05/21/2019   Erin FullingKasa, Kurian, MD  LORazepam (ATIVAN) 2 MG/ML injection Inject 0.5 mLs (1 mg total) into the vein every 4 (four) hours as needed for anxiety. 05/07/2019   Erin FullingKasa, Kurian, MD  methocarbamol 500 mg in dextrose 5 % 50 mL Inject 500 mg into the vein every 6 (six) hours as needed.  05/26/2019   Erin Fulling, MD  methylPREDNISolone sodium succinate (SOLU-MEDROL) 40 mg/mL injection Inject 1 mL (40 mg total) into the vein every 12 (twelve) hours. 05/11/2019   Erin Fulling, MD  ondansetron (ZOFRAN) 4 MG/2ML SOLN injection Inject 2 mLs (4 mg total) into the vein every 8 (eight) hours as needed for nausea or vomiting. 06/01/2019   Erin Fulling, MD  thiamine 100 MG tablet Take 2 tablets (200 mg total) by mouth daily. 05/25/2019   Erin Fulling, MD  thiamine 100 MG tablet Take 2 tablets (200 mg total) by mouth daily. 05/14/19   Erin Fulling, MD     I have independently seen and examined the patient, reviewed data, and developed an assessment and plan. A total of 39 minutes were spent in critical care assessment and medical decision making. This critical care time does not reflect procedure time, or teaching time or supervisory time of PA/NP/Med student/Med Resident, etc but could  involve care discussion time.   Gwynne Edinger, MD PhD 05/14/19 2:18 PM

## 2019-05-14 NOTE — Progress Notes (Signed)
Patient placed in supine position by RT x 1 and RN x 5 without complications.  ETT remains secured at 24 with cloth tape.

## 2019-05-14 NOTE — Progress Notes (Signed)
Head repositioned. Arms rotated. ETT remains secure. No complications.  

## 2019-05-14 NOTE — Progress Notes (Signed)
Foley catheter switched out at this per order for resistence met probably due to increased sediment in urine. 250 mL of urine immediately returned. Sediment and cloudiness noted to urine upon urine return.

## 2019-05-14 NOTE — Progress Notes (Signed)
[facility] Physician-Brief Progress Note Patient Name: Deborah Miller DOB: July 17, 1955 MRN: [id number]   Date of Service  05/14/2019  HPI/Events of Note  Oliguria in this patient who is about to be proned.  eICU Interventions  Insert foley for closer UOP monitoring, especially while being proned.     Intervention Category Intermediate Interventions: Oliguria - evaluation and management  Deborah Miller 05/14/2019, 10:30 PM

## 2019-05-14 NOTE — Progress Notes (Signed)
Head repositioned. Arms rotated. ETT remains secure. No complications.

## 2019-05-14 NOTE — Progress Notes (Signed)
Pt place in prone posittion.  ETT secured with cloth tape. Foam pads already in place.  RNx3 and RTx 1. No complications.

## 2019-05-15 ENCOUNTER — Inpatient Hospital Stay (HOSPITAL_COMMUNITY): Payer: BC Managed Care – PPO

## 2019-05-15 LAB — BASIC METABOLIC PANEL
Anion gap: 8 (ref 5–15)
BUN: 38 mg/dL — ABNORMAL HIGH (ref 8–23)
CO2: 32 mmol/L (ref 22–32)
Calcium: 8.4 mg/dL — ABNORMAL LOW (ref 8.9–10.3)
Chloride: 97 mmol/L — ABNORMAL LOW (ref 98–111)
Creatinine, Ser: 0.74 mg/dL (ref 0.44–1.00)
GFR calc Af Amer: >60 mL/min (ref >60)
GFR calc non Af Amer: >60 mL/min (ref >60)
Glucose, Bld: 261 mg/dL — ABNORMAL HIGH (ref 70–99)
Potassium: 4.8 mmol/L (ref 3.5–5.1)
Sodium: 137 mmol/L (ref 135–145)

## 2019-05-15 LAB — POCT I-STAT 7, (LYTES, BLD GAS, ICA,H+H)
Acid-Base Excess: 9 mmol/L — ABNORMAL HIGH (ref 0.0–2.0)
Bicarbonate: 36.9 mmol/L — ABNORMAL HIGH (ref 20.0–28.0)
Calcium, Ion: 1.27 mmol/L (ref 1.15–1.40)
HCT: 33 % — ABNORMAL LOW (ref 36.0–46.0)
Hemoglobin: 11.2 g/dL — ABNORMAL LOW (ref 12.0–15.0)
O2 Saturation: 99 %
Patient temperature: 98
Potassium: 4.8 mmol/L (ref 3.5–5.1)
Sodium: 137 mmol/L (ref 135–145)
TCO2: 39 mmol/L — ABNORMAL HIGH (ref 22–32)
pCO2 arterial: 64.4 mmHg — ABNORMAL HIGH (ref 32.0–48.0)
pH, Arterial: 7.365 (ref 7.350–7.450)
pO2, Arterial: 127 mmHg — ABNORMAL HIGH (ref 83.0–108.0)

## 2019-05-15 LAB — CBC
HCT: 38.7 % (ref 36.0–46.0)
Hemoglobin: 11.5 g/dL — ABNORMAL LOW (ref 12.0–15.0)
MCH: 25.6 pg — ABNORMAL LOW (ref 26.0–34.0)
MCHC: 29.7 g/dL — ABNORMAL LOW (ref 30.0–36.0)
MCV: 86.2 fL (ref 80.0–100.0)
Platelets: 179 10*3/uL (ref 150–400)
RBC: 4.49 MIL/uL (ref 3.87–5.11)
RDW: 14.6 % (ref 11.5–15.5)
WBC: 16.2 10*3/uL — ABNORMAL HIGH (ref 4.0–10.5)
nRBC: 0 % (ref 0.0–0.2)

## 2019-05-15 LAB — GLUCOSE, CAPILLARY
Glucose-Capillary: 211 mg/dL — ABNORMAL HIGH (ref 70–99)
Glucose-Capillary: 213 mg/dL — ABNORMAL HIGH (ref 70–99)
Glucose-Capillary: 219 mg/dL — ABNORMAL HIGH (ref 70–99)
Glucose-Capillary: 245 mg/dL — ABNORMAL HIGH (ref 70–99)
Glucose-Capillary: 253 mg/dL — ABNORMAL HIGH (ref 70–99)
Glucose-Capillary: 258 mg/dL — ABNORMAL HIGH (ref 70–99)

## 2019-05-15 LAB — CULTURE, RESPIRATORY W GRAM STAIN: Culture: NORMAL

## 2019-05-15 LAB — MAGNESIUM: Magnesium: 2.6 mg/dL — ABNORMAL HIGH (ref 1.7–2.4)

## 2019-05-15 LAB — PHOSPHORUS: Phosphorus: 3.4 mg/dL (ref 2.5–4.6)

## 2019-05-15 MED ORDER — CHLORHEXIDINE GLUCONATE 0.12 % MT SOLN
15.0000 mL | Freq: Two times a day (BID) | OROMUCOSAL | Status: DC
  Administered 2019-05-15 – 2019-05-24 (×18): 15 mL via OROMUCOSAL
  Filled 2019-05-15 (×11): qty 15

## 2019-05-15 NOTE — Progress Notes (Signed)
Pt's head turned and arms rotated with no complications. ETT secured °

## 2019-05-15 NOTE — Progress Notes (Signed)
Patient's head turned and arms rotated.   

## 2019-05-15 NOTE — Progress Notes (Signed)
NAME:  Deborah Miller, MRN:  [id number], DOB:  Jan 17, 1956, LOS: 2 ADMISSION DATE:  2019/05/14, CONSULTATION DATE:  05/14/2019 REFERRING MD:  Belia Heman, CHIEF COMPLAINT:  Dyspnea   Brief History   64 y/o female with COVID 19 pneumonia admitted on 1/1 to [facility] developed progressive hypoxemia and dyspnea ultimately requiring intubation on 1/7, transferred to [facility] on 1/8.  History of present illness   This is a 64 y/o female with multiple medical problems who was admitted to [facility] on 1/1 with dyspnea.  She had been diagnosed on 12/23 by her PCP and had worsening symptoms so she came to the [facility] ED on 12/31.  She was diagnosed with COVID pneumonia and hypoxemic respiratory failure.  She was admitted to the ICU, treated with BIPAP, high flow nasal cannula, remdesivir and ivermectin.  Developed sub cutaneous emphysema on 1/3.  Intubated on 1/7.  Past Medical History  osa htn Morbid obesity gerd dm2 Cor pulmonale Asthma Thyroid nodule  Significant Hospital Events   1/1 admit [facility], bipap 1/3 sub q emphysema 1/7 intubated  Consults:  PCCM  Procedures:  ETT 1/7 >  R IJ CVL  1/7 >   Significant Diagnostic Tests:  1/2 LE Doppler > DVT left peroneal vein  Micro Data:  12/23 sars cov 2 > pos  Antimicrobials:  12/31 Remdesivir > 1/4 1/4 doxycycline> 1/8 1/5 Ivermectin > 1/6  Interim history/subjective:  No acute events o/n; prone this a.m.  Objective   Blood pressure 116/74, pulse 88, temperature 98 F (36.7 C), temperature source Axillary, resp. rate (!) 25, height 4\' 7"  (1.397 m), weight 87.3 kg, SpO2 100 %.    Vent Mode: PRVC FiO2 (%):  [70 %-90 %] 70 % Set Rate:  [25 bmp] 25 bmp Vt Set:  [270 mL-350 mL] 350 mL PEEP:  [10 cmH20] 10 cmH20 Plateau Pressure:  [18 cmH20-25 cmH20] 23 cmH20   Intake/Output Summary (Last 24 hours) at 05/15/2019 1300 Last data filed at 05/15/2019 1200 Gross per 24 hour  Intake 1542.63 ml  Output 1450 ml  Net 92.63 ml   Filed Weights   May 14, 2019 1540 05/14/19 0500 05/15/19 0439  Weight: 85.1 kg 87.9 kg 87.3 kg    Examination:  General: ill appearing, NAD HENT: [place]/AT ETT in place PULM: wheezing, distant breath sounds nearly occluded sounding on left; vent supported breathing CV: RRR, no r/m/g GI: BS+, soft, nontender MSK: obese; o/w normal bulk and tone Extrem: no c/c/e Neuro: sedated on vent; PERRLA, midline  CXR personally reviewed > 1/9 sub cutaneous emphysema, ett in place (will pull back 1 cm or so), worsening diffuse opacities compared to 1/8  Resolved Hospital Problem list     Assessment & Plan:  ARDS due to COVID 19 pneumonia> complicated by pneumomediastinum Continue mechanical ventilation per ARDS protocol Target TVol 6-8cc/kg IBW Target Plateau Pressure < 30cm H20 Target driving pressure less than 15 cm of water Target PaO2 55-65: titrate PEEP/FiO2 per protocol As long as PaO2 to FiO2 ratio is less than 1:150 position in prone position for 16 hours a day Check CVP daily if CVL in place Target CVP less than 4, diurese as necessary Ventilator associated pneumonia prevention protocol 1/8 adjust TVol to 8cc/kg, monitor plateau, prone, continue solumedrol,   Asthma exacerbation Hypertonic saline neb duoneb scheduled Solumedrol LABA/ICS  Severe vent dyssynchrony --Sedation with versed, fentanyl infusions --Intermittent vecuronium --Oral oxycodone and clonazepam  DVT lovenox bid  DM2 SSI  Best practice:  Diet: tube feeding Pain/Anxiety/Delirium protocol (  if indicated): yes, RASS target -4 to -5 VAP protocol (if indicated): yes DVT prophylaxis: lovenox GI prophylaxis: Pantoprazole for stress ulcer prophylaxis Glucose control: SSI Mobility: bed  Code Status: full Family Communication: family aware Disposition:  ICU  Labs   CBC: Recent Labs  Lab 05/09/19 0546 05/10/19 0838 05/11/19 0808 05/12/19 0244 05/19/2019 0515 05/09/2019 1811 05/14/19 0405 05/14/19 1544 05/15/19 0319  WBC  14.7* 21.5* 20.5* 18.4* 13.8*  --  14.3*  --  16.2*  NEUTROABS 13.6* 20.0*  --   --  12.5*  --   --   --   --   HGB 13.7 14.1 15.1* 13.8 12.7 13.6 12.5 12.9 11.5*  HCT 41.5 44.3 48.1* 43.0 40.4 40.0 41.5 38.0 38.7  MCV 76.6* 79.7* 80.4 78.9* 81.0  --  84.9  --  86.2  PLT 238 253 291 281 231  --  192  --  179    Basic Metabolic Panel: Recent Labs  Lab 05/11/19 0808 05/12/19 0244 05/21/2019 0515 05/20/2019 1751 05/19/2019 1811 05/14/19 0405 05/14/19 1544 05/14/19 1708 05/15/19 0319  NA 135 133* 137  --  135 135 133*  --  137  K 3.7 3.8 4.2  --  4.5 4.5 4.9  --  4.8  CL 96* 96* 97*  --   --  96*  --   --  97*  CO2 27 28 31   --   --  28  --   --  32  GLUCOSE 121* 151* 102*  --   --  261*  --   --  261*  BUN 31* 30* 29*  --   --  33*  --   --  38*  CREATININE 0.66 0.60 0.70  --   --  0.72  --   --  0.74  CALCIUM 8.6* 8.5* 8.0*  --   --  8.3*  --   --  8.4*  MG 2.1  --   --  2.2  --  2.4  --  2.5* 2.6*  PHOS  --   --   --  3.8  --  5.3*  --  4.1 3.4   GFR: Estimated Creatinine Clearance: 62.8 mL/min (by C-G formula based on SCr of 0.74 mg/dL). Recent Labs  Lab 05/12/19 0244 05/10/2019 0515 05/14/19 0405 05/15/19 0319  WBC 18.4* 13.8* 14.3* 16.2*    Liver Function Tests: Recent Labs  Lab 05/09/19 0546 05/10/19 0838 05/12/2019 0515  AST 31 27 29   ALT 26 25 31   ALKPHOS 107 107 79  BILITOT 0.8 0.8 0.7  PROT 6.8 6.5 5.3*  ALBUMIN 2.9* 2.7* 2.2*   No results for input(s): LIPASE, AMYLASE in the last 168 hours. No results for input(s): AMMONIA in the last 168 hours.  ABG    Component Value Date/Time   PHART 7.245 (L) 05/14/2019 1544   PCO2ART 77.9 (HH) 05/14/2019 1544   PO2ART 100.0 05/14/2019 1544   HCO3 33.9 (H) 05/14/2019 1544   TCO2 36 (H) 05/14/2019 1544   O2SAT 96.0 05/14/2019 1544     Coagulation Profile: No results for input(s): INR, PROTIME in the last 168 hours.  Cardiac Enzymes: No results for input(s): CKTOTAL, CKMB, CKMBINDEX, TROPONINI in the last 168  hours.  HbA1C: Hgb A1c MFr Bld  Date/Time Value Ref Range Status  05/05/2019 12:48 PM 7.6 (H) 4.8 - 5.6 % Final    Comment:    (NOTE) Pre diabetes:          5.7%-6.4% Diabetes:              >  6.4% Glycemic control for   <7.0% adults with diabetes   10/15/2017 12:37 PM 6.5 (H) 4.8 - 5.6 % Final    Comment:    (NOTE) Pre diabetes:          5.7%-6.4% Diabetes:              >6.4% Glycemic control for   <7.0% adults with diabetes     CBG: Recent Labs  Lab 05/14/19 1940 05/14/19 2326 05/15/19 0329 05/15/19 0757 05/15/19 1201  GLUCAP 267* 280* 245* 211* 213*    Review of Systems:   Cannot obtain, intubated  Past Medical History  She,  has a past medical history of Acid reflux, Allergic genetic state, Anxiety, Arthritis, Asthma (1957), Breast microcalcification, mammographic (2014), Breast screening, unspecified, Carpal tunnel syndrome (1999), Cor pulmonale (HCC), DDD (degenerative disc disease), lumbar, Depression, Diabetes mellitus without complication (HCC) (2019), Environmental allergies, Esophageal spasm, Fecal smearing, Headache, Heart murmur, Hemorrhoids (2012), History of hiatal hernia, Hypertension, Lump in thyroid (2019), Morbid obesity (HCC), Osteoarthritis of spine with radiculopathy, lumbar region, Other dysphagia, Pain, PONV (postoperative nausea and vomiting), Primary osteoarthritis of left knee, Primary osteoarthritis of right hip, Right thyroid nodule, Shortness of breath dyspnea, and Sleep apnea.   Surgical History    Past Surgical History:  Procedure Laterality Date  . ANTERIOR FUSION CERVICAL SPINE    . BACK SURGERY  2010   C 5 & 6 fusion  . BREAST BIOPSY Left    stereo  . BREAST EXCISIONAL BIOPSY Left 2014   benign   . BUNIONECTOMY Left 1996   pin removed  . carpal tunnel--left hand  Left 2012  . CHOLECYSTECTOMY  2002  . COLONOSCOPY  2009   Dr. Mechele Collin  . COLONOSCOPY WITH PROPOFOL N/A 08/20/2016   Procedure: COLONOSCOPY WITH PROPOFOL;  Surgeon:  Earline Mayotte, MD;  Location: [facility] ENDOSCOPY;  Service: Endoscopy;  Laterality: N/A;  . DILATION AND CURETTAGE OF UTERUS  1987  . ESOPHAGOGASTRODUODENOSCOPY (EGD) WITH PROPOFOL N/A 04/30/2018   Procedure: ESOPHAGOGASTRODUODENOSCOPY (EGD) WITH PROPOFOL;  Surgeon: Scot Jun, MD;  Location: [facility] ENDOSCOPY;  Service: Endoscopy;  Laterality: N/A;  . ganglion cyst removal  Left 1988  . JOINT REPLACEMENT Right 2016   TOTAL HIP ARTHROPLASTY by dr. Ernest Pine  . KNEE ARTHROPLASTY Right 10/28/2017   Procedure: COMPUTER ASSISTED TOTAL KNEE ARTHROPLASTY;  Surgeon: Donato Heinz, MD;  Location: [facility] ORS;  Service: Orthopedics;  Laterality: Right;  . KNEE SURGERY Left 2014   partial knee replacement by dr. Erin Sons  . left breast biopsy Left 2006   mammary glands swollen  . MOUTH LESION EXCISIONAL BIOPSY    . NASAL SEPTUM SURGERY  1975   deviated septum repaired  . neck injection Right 2014   block for inflammed nerve. repeated in 2018 on left side for same thing. done by dr. Lowella Dandy  . NOSE SURGERY  2001   clean out of nasal passages  . TONSILLECTOMY  1991   uvula removed also for OSA  . tonsilloadenoidectomy  2003   uvula also for OSA. titanium screw on bottom of lower jaw  . TOTAL HIP ARTHROPLASTY Right 02/14/2015   Procedure: TOTAL HIP ARTHROPLASTY;  Surgeon: Donato Heinz, MD;  Location: [facility] ORS;  Service: Orthopedics;  Laterality: Right;  . TUBAL LIGATION    . UPPER GI ENDOSCOPY  2009, 2015   Dr Mechele Collin / Dr Lemar Livings     Social History   reports that she quit smoking about 32 years ago. She  has a 10.00 pack-year smoking history. She has never used smokeless tobacco. She reports current alcohol use. She reports that she does not use drugs.   Family History   Her family history includes Diabetes in her father; Heart disease in her father; Ovarian cancer in her maternal grandmother. There is no history of Breast cancer or Colon cancer.   Allergies Allergies  Allergen  Reactions  . Citrus     Itchy throat   . Pork-Derived Products     Itchy throat   . Augmentin [Amoxicillin-Pot Clavulanate] Nausea And Vomiting    Has patient had a PCN reaction causing immediate rash, facial/tongue/throat swelling, SOB or lightheadedness with hypotension: No Has patient had a PCN reaction causing severe rash involving mucus membranes or skin necrosis: No Has patient had a PCN reaction that required hospitalization: No Has patient had a PCN reaction occurring within the last 10 years: Yes If all of the above answers are "NO", then may proceed with Cephalosporin use.   . Latex Other (See Comments)    Blisters NOTE: Latex IgE NEGATIVE (<0.10) Around mouth causes a rash  . Morphine And Related Nausea And Vomiting    Patient does not want morphine at all.  . Cefadroxil Other (See Comments)    Severe cramping in side. Felt like ribs were going to explode  . Other Other (See Comments)    Lettuce causes her to start with diverticulitis  . Pollen Extract Other (See Comments)    Trees, grass, mold, dust causes raspy voice, nasal irritation  . Tape Other (See Comments)    Blisters. Paper tape is acceptable     Home Medications  Prior to Admission medications   Medication Sig Start Date End Date Taking? Authorizing Provider  apixaban (ELIQUIS) 5 MG TABS tablet Take 1 tablet (5 mg total) by mouth 2 (two) times daily. 05/06/2019   Erin Fulling, MD  ascorbic acid (VITAMIN C) 500 MG tablet Take 1 tablet (500 mg total) by mouth 2 (two) times daily. 05/19/2019   Erin Fulling, MD  aspirin 81 MG chewable tablet Chew 1 tablet (81 mg total) by mouth daily. 05/22/2019   Erin Fulling, MD  chlorhexidine (PERIDEX) 0.12 % solution 15 mLs by Mouth Rinse route 2 (two) times daily. 05/29/2019   Erin Fulling, MD  cholecalciferol (VITAMIN D) 25 MCG tablet Take 1 tablet (1,000 Units total) by mouth daily. 05/06/2019   Erin Fulling, MD  doxycycline 100 mg in sodium chloride 0.9 % 250 mL Inject 100 mg into the vein  every 12 (twelve) hours. 06/03/2019   Erin Fulling, MD  famotidine (PEPCID) 40 MG tablet Take 1 tablet (40 mg total) by mouth daily. 05/12/2019   Erin Fulling, MD  hydrOXYzine (ATARAX/VISTARIL) 10 MG tablet Take 1 tablet (10 mg total) by mouth 3 (three) times daily as needed for anxiety or nausea. 05/26/2019   Erin Fulling, MD  insulin aspart (NOVOLOG) 100 UNIT/ML injection Inject 0-15 Units into the skin every 4 (four) hours. 05/25/2019   Erin Fulling, MD  insulin detemir (LEVEMIR) 100 UNIT/ML injection Inject 0.15 mLs (15 Units total) into the skin 2 (two) times daily. 05/10/2019   Erin Fulling, MD  levalbuterol [facility] HFA) 45 MCG/ACT inhaler Inhale 2 puffs into the lungs every 6 (six) hours as needed for wheezing or shortness of breath. 05/22/2019   Erin Fulling, MD  lidocaine (LIDODERM) 5 % Place 1 patch onto the skin daily. Remove & Discard patch within 12 hours or as directed by MD 05/06/2019  Flora Lipps, MD  LORazepam (ATIVAN) 2 MG/ML injection Inject 0.5 mLs (1 mg total) into the vein every 4 (four) hours as needed for anxiety. 05/16/2019   Flora Lipps, MD  methocarbamol 500 mg in dextrose 5 % 50 mL Inject 500 mg into the vein every 6 (six) hours as needed. 05/26/2019   Flora Lipps, MD  methylPREDNISolone sodium succinate (SOLU-MEDROL) 40 mg/mL injection Inject 1 mL (40 mg total) into the vein every 12 (twelve) hours. 06/02/2019   Flora Lipps, MD  ondansetron (ZOFRAN) 4 MG/2ML SOLN injection Inject 2 mLs (4 mg total) into the vein every 8 (eight) hours as needed for nausea or vomiting. 06/03/2019   Flora Lipps, MD  thiamine 100 MG tablet Take 2 tablets (200 mg total) by mouth daily. 06/01/2019   Flora Lipps, MD  thiamine 100 MG tablet Take 2 tablets (200 mg total) by mouth daily. 05/14/19   Flora Lipps, MD     I have independently seen and examined the patient, reviewed data, and developed an assessment and plan. A total of 34 minutes were spent in critical care assessment and medical decision making. This critical care time does  not reflect procedure time, or teaching time or supervisory time of PA/NP/Med student/Med Resident, etc but could involve care discussion time.   Bonna Gains, MD PhD 05/15/19 1:15 PM

## 2019-05-16 ENCOUNTER — Inpatient Hospital Stay (HOSPITAL_COMMUNITY): Payer: BC Managed Care – PPO

## 2019-05-16 DIAGNOSIS — U071 COVID-19: Principal | ICD-10-CM

## 2019-05-16 DIAGNOSIS — R739 Hyperglycemia, unspecified: Secondary | ICD-10-CM

## 2019-05-16 DIAGNOSIS — J80 Acute respiratory distress syndrome: Secondary | ICD-10-CM

## 2019-05-16 LAB — BASIC METABOLIC PANEL
Anion gap: 8 (ref 5–15)
BUN: 46 mg/dL — ABNORMAL HIGH (ref 8–23)
CO2: 33 mmol/L — ABNORMAL HIGH (ref 22–32)
Calcium: 8.4 mg/dL — ABNORMAL LOW (ref 8.9–10.3)
Chloride: 98 mmol/L (ref 98–111)
Creatinine, Ser: 0.67 mg/dL (ref 0.44–1.00)
GFR calc Af Amer: >60 mL/min (ref >60)
GFR calc non Af Amer: >60 mL/min (ref >60)
Glucose, Bld: 301 mg/dL — ABNORMAL HIGH (ref 70–99)
Potassium: 5.3 mmol/L — ABNORMAL HIGH (ref 3.5–5.1)
Sodium: 139 mmol/L (ref 135–145)

## 2019-05-16 LAB — APTT: aPTT: 29 seconds (ref 24–36)

## 2019-05-16 LAB — GLUCOSE, CAPILLARY
Glucose-Capillary: 276 mg/dL — ABNORMAL HIGH (ref 70–99)
Glucose-Capillary: 262 mg/dL — ABNORMAL HIGH (ref 70–99)
Glucose-Capillary: 290 mg/dL — ABNORMAL HIGH (ref 70–99)
Glucose-Capillary: 346 mg/dL — ABNORMAL HIGH (ref 70–99)
Glucose-Capillary: 307 mg/dL — ABNORMAL HIGH (ref 70–99)
Glucose-Capillary: 242 mg/dL — ABNORMAL HIGH (ref 70–99)

## 2019-05-16 LAB — PROTIME-INR
INR: 1.1 (ref 0.8–1.2)
Prothrombin Time: 14 seconds (ref 11.4–15.2)

## 2019-05-16 LAB — CBC
HCT: 34.5 % — ABNORMAL LOW (ref 36.0–46.0)
Hemoglobin: 10.4 g/dL — ABNORMAL LOW (ref 12.0–15.0)
MCH: 25.6 pg — ABNORMAL LOW (ref 26.0–34.0)
MCHC: 30.1 g/dL (ref 30.0–36.0)
MCV: 85 fL (ref 80.0–100.0)
Platelets: 177 10*3/uL (ref 150–400)
RBC: 4.06 MIL/uL (ref 3.87–5.11)
RDW: 15.3 % (ref 11.5–15.5)
WBC: 14.3 10*3/uL — ABNORMAL HIGH (ref 4.0–10.5)
nRBC: 0 % (ref 0.0–0.2)

## 2019-05-16 MED ORDER — ENOXAPARIN SODIUM 100 MG/ML ~~LOC~~ SOLN
1.0000 mg/kg | Freq: Two times a day (BID) | SUBCUTANEOUS | Status: DC
  Administered 2019-05-17 – 2019-05-24 (×13): 100 mg via SUBCUTANEOUS
  Filled 2019-05-16 (×18): qty 1

## 2019-05-16 MED ORDER — "THROMBI-PAD 3""X3"" EX PADS"
1.0000 | MEDICATED_PAD | Freq: Once | CUTANEOUS | Status: AC
  Administered 2019-05-16: 15:00:00 1 via TOPICAL
  Filled 2019-05-16: qty 1

## 2019-05-16 MED ORDER — METHYLPREDNISOLONE SODIUM SUCC 40 MG IJ SOLR
40.0000 mg | INTRAMUSCULAR | Status: DC
  Administered 2019-05-17: 05:00:00 40 mg via INTRAVENOUS
  Filled 2019-05-16: qty 1

## 2019-05-16 MED ORDER — HYDRALAZINE HCL 20 MG/ML IJ SOLN: 10.0000 mg | INTRAMUSCULAR | Status: DC | PRN

## 2019-05-16 MED ORDER — INSULIN ASPART 100 UNIT/ML ~~LOC~~ SOLN
4.0000 [IU] | SUBCUTANEOUS | Status: DC
  Administered 2019-05-16 – 2019-05-19 (×17): 4 [IU] via SUBCUTANEOUS

## 2019-05-16 NOTE — Progress Notes (Signed)
S/w pt's dtr before noon and approx 1730; updated her.  Per pt's dtr, pt has hx of retention s/p removal of F/C with knee replacement last year. Pt not yet voided, will pass along in report need to bladder scan and possible reinsert.  MD notified:  Pt's BP is elevated (182/82) despite going up on sedation. Presently w/o PRNs.  MD added PRN Hydralazine for SBP >180

## 2019-05-16 NOTE — Progress Notes (Signed)
NAME:  Deborah Miller, MRN:  [id number], DOB:  [birth year], LOS: 3 ADMISSION DATE:  19-May-2019, CONSULTATION DATE:  May 19, 2019 REFERRING MD:  Belia Heman, CHIEF COMPLAINT:  Dyspnea   Brief History   64 y/o female with COVID 19 pneumonia admitted on 1/1 to [facility] ICU, treated with BIPAP, high flow nasal cannula, remdesivir and ivermectin.  Developed sub cutaneous emphysema on 1/3.  developed progressive hypoxemia and dyspnea ultimately requiring intubation on 1/7, transferred to [facility] on 1/8. COVID infection initially diagnosed 12/23  Past Medical History  osa htn Morbid obesity gerd dm2 Cor pulmonale Asthma Thyroid nodule  Significant Hospital Events   1/1 admit [facility], bipap 1/3 sub q emphysema 1/7 intubated  Consults:  PCCM  Procedures:  ETT 1/7 >  R IJ CVL  1/7 >   Significant Diagnostic Tests:  1/2 LE Doppler > DVT left peroneal vein  Micro Data:  12/23 sars cov 2 > pos  Antimicrobials:  12/31 Remdesivir > 1/4 1/4 doxycycline> 1/8 1/5 Ivermectin > 1/6  Interim history/subjective:  Remains critically ill, intubated On 50%/PEEP of 10 Sedated on Versed/fentanyl   Objective   Blood pressure (!) 164/88, pulse (!) 111, temperature 99.1 F (37.3 C), temperature source Axillary, resp. rate (!) 25, height 4\' 7"  (1.397 m), weight 98.6 kg, SpO2 (!) 89 %.    Vent Mode: PSV;CPAP FiO2 (%):  [40 %-70 %] 40 % Set Rate:  [25 bmp] 25 bmp Vt Set:  [350 mL] 350 mL PEEP:  [10 cmH20] 10 cmH20 Pressure Support:  [15 cmH20] 15 cmH20 Plateau Pressure:  [26 cmH20] 26 cmH20   Intake/Output Summary (Last 24 hours) at 05/16/2019 1712 Last data filed at 05/16/2019 1600 Gross per 24 hour  Intake 2384.1 ml  Output 1880 ml  Net 504.1 ml   Filed Weights   05/15/19 0439 05/16/19 0500 05/16/19 1300  Weight: 87.3 kg 97.6 kg 98.6 kg    Examination:  General: ill appearing middle-aged woman, intubated, NAD HENT: [place]/AT ETT in place PULM: Lateral decreased breath sounds CV: RRR,  no r/m/g GI: BS+, soft, nontender MSK: obese; o/w normal bulk and tone Extrem: no c/c/e Neuro: sedated on vent; PERRLA, midline  Chest x-ray 1/10 personally reviewed, decreased bilateral airspace disease, decreased subcutaneous air.  Labs show hyperglycemia, mild hyperkalemia, table renal function, decreasing leukocytosis  Resolved Hospital Problem list     Assessment & Plan:  ARDS due to COVID 19 pneumonia> complicated by pneumomediastinum Continue mechanical ventilation per ARDS protocol Target TVol 6-8cc/kg IBW Target Plateau Pressure < 30cm H20 Target driving pressure less than 15 cm of water Target PaO2 55-65: titrate PEEP/FiO2 per protocol As long as PaO2 to FiO2 ratio is less than 1:150 position in prone position for 16 hours a day Check CVP daily if CVL in place Target CVP less than 4, diurese as necessary Ventilator associated pneumonia prevention protocol   Asthma exacerbation duoneb scheduled Solumedrol 40 every24-can decrease LABA/ICS  Severe vent dyssynchrony --Sedation with versed, fentanyl infusions --Try to avoid paralytic here --Oral oxycodone and clonazepam  DVT lovenox bid  DM2 SSI Continue detemir 15 units every 12 Add tube feed coverage 4 units every 4  Best practice:  Diet: tube feeding Pain/Anxiety/Delirium protocol (if indicated): yes, RASS target -3 VAP protocol (if indicated): yes DVT prophylaxis: lovenox GI prophylaxis: Pantoprazole for stress ulcer prophylaxis Glucose control: SSI Mobility: bed  Code Status: full Family Communication: updated 1/10 Disposition:  ICU   The patient is critically ill with multiple organ systems failure  and requires high complexity decision making for assessment and support, frequent evaluation and titration of therapies, application of advanced monitoring technologies and extensive interpretation of multiple databases. Critical Care Time devoted to patient care services described in this note  independent of APP/resident  time is 35 minutes.   Kara Mead MD. Shade Flood. [facility]  If no response to pager , please call [phone]   05/16/2019

## 2019-05-16 NOTE — Progress Notes (Addendum)
ANTICOAGULATION CONSULT NOTE  Pharmacy Consult for Enoxaparin Indication: DVT  Patient Measurements: Height: 4\' 7"  (139.7 cm) Weight: 215 lb 2.7 oz (97.6 kg) IBW/kg (Calculated) : 34  Vital Signs: Temp: 98.6 F (37 C) (01/11 0800) Temp Source: Axillary (01/11 0800) BP: 146/92 (01/11 1011) Pulse Rate: 96 (01/11 1011)  Labs: Recent Labs    05/14/19 0405 05/15/19 0319 05/15/19 1703 05/16/19 0400  HGB 12.5 11.5* 11.2* 10.4*  HCT 41.5 38.7 33.0* 34.5*  PLT 192 179  --  177  CREATININE 0.72 0.74  --  0.67    Estimated Creatinine Clearance: 67.5 mL/min (by C-G formula based on SCr of 0.67 mg/dL).   Assessment: Patient is a 64yo female admitted for Covid, with new DVT. Venous ultrasound revealed an occlusive DVT involving the left peroneal vein. Of note patient was transitioned from Lovenox (1/2-1/7) to apixaban (1/7-1/8) at [facility] but now back to Lovenox.   SCr remains low/stable.  Hgb decreased to 10.4 (baseline 12.1), and Plt decreased but WNL. Note, weight rechecked today with increase to 98.6 kg (RN removed pillows, and foley bag to ensure accurate weight) Bleeding:  Rn reports CVC is oozing, thrombipad ordered for application on 1/11.   Plan:   Enoxaparin 1mg /kg (100mg ) SQ q12h.  CBC and Bmet q72h  3/11 PharmD, BCPS Clinical pharmacist phone 7am- 5pm: [phone] 05/16/2019 1:33 PM

## 2019-05-16 NOTE — Progress Notes (Signed)
Inpatient Diabetes Program Recommendations  AACE/ADA: New Consensus Statement on Inpatient Glycemic Control (2015)  Target Ranges:  Prepandial:   less than 140 mg/dL      Peak postprandial:   less than 180 mg/dL (1-2 hours)      Critically ill patients:  140 - 180 mg/dL   Lab Results  Component Value Date   GLUCAP 290 (H) 05/16/2019   HGBA1C 7.6 (H) 05/05/2019    Review of Glycemic Control Results for Deborah Miller, Deborah Miller (MRN [id number]) as of 05/16/2019 13:36  Ref. Range 05/15/2019 19:29 05/15/2019 23:34 05/16/2019 03:37 05/16/2019 08:56 05/16/2019 12:34  Glucose-Capillary Latest Ref Range: 70 - 99 mg/dL 825 (H) 053 (H) 976 (H) 262 (H) 290 (H)   Diabetes history: DM 2 Outpatient Diabetes medications:  Novolog 0-15 units, Levemir 15 units bid Current orders for Inpatient glycemic control:  Novolog resistant q 4 hours Levemir 15 units bid Vital 1.5-50 cc/hr Inpatient Diabetes Program Recommendations:    Please add Novolog tube feed coverage 4 units q 4 hours.   Thanks  Beryl Meager, RN, BC-ADM Inpatient Diabetes Coordinator Pager [phone] (8a-5p)

## 2019-05-16 NOTE — Progress Notes (Signed)
Updated patient's daughter Wilkie Aye about the patient's condition.  No other questions at this time.

## 2019-05-16 NOTE — Progress Notes (Signed)
eLink Physician-Brief Progress Note Patient Name: Deborah Miller DOB: [birth year] MRN: [id number]   Date of Service  05/16/2019  HPI/Events of Note  Pt has had some oropharyngeal bleeding. Hemoglobin 10.4 gm %, she's on full anticoagulation dose Lovenox for Covid.  eICU Interventions  Hold lovenox and check PT, INR, PTT. If unremarkable will resume Lovenox.        Migdalia Dk 05/16/2019, 9:42 PM

## 2019-05-17 ENCOUNTER — Inpatient Hospital Stay (HOSPITAL_COMMUNITY): Payer: BC Managed Care – PPO

## 2019-05-17 LAB — BASIC METABOLIC PANEL
Anion gap: 10 (ref 5–15)
BUN: 56 mg/dL — ABNORMAL HIGH (ref 8–23)
CO2: 33 mmol/L — ABNORMAL HIGH (ref 22–32)
Calcium: 8.7 mg/dL — ABNORMAL LOW (ref 8.9–10.3)
Chloride: 96 mmol/L — ABNORMAL LOW (ref 98–111)
Creatinine, Ser: 0.66 mg/dL (ref 0.44–1.00)
GFR calc Af Amer: >60 mL/min (ref >60)
GFR calc non Af Amer: >60 mL/min (ref >60)
Glucose, Bld: 204 mg/dL — ABNORMAL HIGH (ref 70–99)
Potassium: 5.2 mmol/L — ABNORMAL HIGH (ref 3.5–5.1)
Sodium: 139 mmol/L (ref 135–145)

## 2019-05-17 LAB — CBC
HCT: 32.4 % — ABNORMAL LOW (ref 36.0–46.0)
Hemoglobin: 10 g/dL — ABNORMAL LOW (ref 12.0–15.0)
MCH: 25.7 pg — ABNORMAL LOW (ref 26.0–34.0)
MCHC: 30.9 g/dL (ref 30.0–36.0)
MCV: 83.3 fL (ref 80.0–100.0)
Platelets: 240 10*3/uL (ref 150–400)
RBC: 3.89 MIL/uL (ref 3.87–5.11)
RDW: 16.1 % — ABNORMAL HIGH (ref 11.5–15.5)
WBC: 17.4 10*3/uL — ABNORMAL HIGH (ref 4.0–10.5)
nRBC: 0.1 % (ref 0.0–0.2)

## 2019-05-17 LAB — GLUCOSE, CAPILLARY
Glucose-Capillary: 182 mg/dL — ABNORMAL HIGH (ref 70–99)
Glucose-Capillary: 277 mg/dL — ABNORMAL HIGH (ref 70–99)
Glucose-Capillary: 352 mg/dL — ABNORMAL HIGH (ref 70–99)
Glucose-Capillary: 266 mg/dL — ABNORMAL HIGH (ref 70–99)
Glucose-Capillary: 307 mg/dL — ABNORMAL HIGH (ref 70–99)

## 2019-05-17 MED ORDER — FUROSEMIDE 10 MG/ML IJ SOLN
40.0000 mg | Freq: Every day | INTRAMUSCULAR | Status: DC
  Administered 2019-05-17 – 2019-05-19 (×3): 40 mg via INTRAVENOUS
  Filled 2019-05-17 (×3): qty 4

## 2019-05-17 MED ORDER — FREE WATER
200.0000 mL | Freq: Four times a day (QID) | Status: DC
  Administered 2019-05-17 – 2019-05-22 (×19): 200 mL

## 2019-05-17 NOTE — Progress Notes (Signed)
NAME:  Deborah Miller, MRN:  [id number], DOB:  November 12, 1955, LOS: 4 ADMISSION DATE:  23-May-2019, CONSULTATION DATE:  05-23-2019 REFERRING MD:  Mortimer Fries, CHIEF COMPLAINT:  Dyspnea   Brief History   64 y/o female with COVID 19 pneumonia admitted on 1/1 to [facility] ICU, treated with BIPAP, high flow nasal cannula, remdesivir and ivermectin.  Developed sub cutaneous emphysema on 1/3.  developed progressive hypoxemia and dyspnea ultimately requiring intubation on 1/7, transferred to [facility] on 1/8. COVID infection initially diagnosed 12/23  Past Medical History  osa htn Morbid obesity gerd dm2 Cor pulmonale Asthma Thyroid nodule  Significant Hospital Events   1/1 admit [facility], bipap 1/3 sub q emphysema 1/7 intubated  Consults:  PCCM  Procedures:  ETT 1/7 >  R IJ CVL  1/7 >   Significant Diagnostic Tests:  1/2 LE Doppler > DVT left peroneal vein  Micro Data:  12/23 sars cov 2 > pos 1/8 >> nml flora  Antimicrobials:  12/31 Remdesivir > 1/4 1/4 doxycycline> 1/8 1/5 Ivermectin > 1/6  Interim history/subjective:   Remains critically ill, intubated On 40%/PEEP of 10 Sedated on fentanyl and low-dose Versed Afebrile  Objective   Blood pressure 139/75, pulse 82, temperature 99.1 F (37.3 C), temperature source Axillary, resp. rate (!) 21, height 4\' 7"  (1.397 m), weight 97.1 kg, SpO2 (!) 87 %.    Vent Mode: PRVC FiO2 (%):  [40 %-50 %] 40 % Set Rate:  [25 bmp] 25 bmp Vt Set:  [350 mL] 350 mL PEEP:  [10 cmH20] 10 cmH20 Plateau Pressure:  [27 cmH20] 27 cmH20   Intake/Output Summary (Last 24 hours) at 05/17/2019 1617 Last data filed at 05/17/2019 1500 Gross per 24 hour  Intake 3325.86 ml  Output 1050 ml  Net 2275.86 ml   Filed Weights   05/16/19 0500 05/16/19 1300 05/17/19 0500  Weight: 97.6 kg 98.6 kg 97.1 kg    Examination:  General: ill appearing middle-aged woman, intubated, NAD HENT: [place]/AT ETT in place, no pallor, no icterus PULM: Bilateral decreased breath  sounds, no accessory muscle use  CV: RRR, no r/m/g GI: BS+, soft, nontender MSK: obese; o/w normal bulk and tone Extrem: no c/c/e Neuro: sedated on vent; PERRLA, midline  Chest x-ray 1/12 personally reviewed, lateral unchanged airspace disease, ET tube in right mainstem bronchus  Labs show uncontrolled hyperglycemia, mild hyperkalemia, stable renal function, mild increased leukocytosis  Resolved Hospital Problem list     Assessment & Plan:  ARDS due to COVID 19 pneumonia> complicated by pneumomediastinum Continue mechanical ventilation per ARDS protocol Target TVol 6-8cc/kg IBW Target Plateau Pressure < 30cm H20 Target driving pressure less than 15 cm of water Target PaO2 55-65: titrate PEEP/FiO2 per protocol Since P/F ratio is improved, can maintain in supine position Target CVP less than 4, diurese daily Lasix as renal function permits Ventilator associated pneumonia prevention protocol Hopeful that we will make progress with spontaneous breathing trials  Asthma exacerbation duoneb scheduled Dc Solumedrol  LABA/ICS  Severe vent dyssynchrony --Sedation with versed, fentanyl infusions, goal RASS -1 --Try to avoid paralytic here --Oral oxycodone and clonazepam  LLE DVT lovenox bid  DM2 SSI Continue detemir 15 units every 12 , hopefully discontinuing steroid should help Ct tube feed coverage 4 units every 4  Summary-overall improving, hopeful that we will make progress with weaning to extubation over the next few days  Best practice:  Diet: tube feeding Pain/Anxiety/Delirium protocol (if indicated): yes, RASS target -1 VAP protocol (if indicated): yes DVT prophylaxis:  lovenox Rx dose GI prophylaxis: Pantoprazole  Glucose control: SSI Mobility: bed  Code Status: full Family Communication: daughter 1/12 Disposition:  ICU  The patient is critically ill with multiple organ systems failure and requires high complexity decision making for assessment and support,  frequent evaluation and titration of therapies, application of advanced monitoring technologies and extensive interpretation of multiple databases. Critical Care Time devoted to patient care services described in this note independent of APP/resident  time is 35 minutes.    Cyril Mourning MD. Tonny Bollman. [facility]  If no response to pager , please call [phone]   05/17/2019

## 2019-05-17 NOTE — Progress Notes (Signed)
Inpatient Diabetes Program Recommendations  AACE/ADA: New Consensus Statement on Inpatient Glycemic Control (2015)  Target Ranges:  Prepandial:   less than 140 mg/dL      Peak postprandial:   less than 180 mg/dL (1-2 hours)      Critically ill patients:  140 - 180 mg/dL   Lab Results  Component Value Date   GLUCAP 352 (H) 05/17/2019   HGBA1C 7.6 (H) 05/05/2019    Review of Glycemic Control Results for Deborah Miller, Deborah Miller (MRN [id number]) as of 05/17/2019 14:01  Ref. Range 05/16/2019 19:19 05/16/2019 23:11 05/17/2019 03:35 05/17/2019 07:54 05/17/2019 11:30  Glucose-Capillary Latest Ref Range: 70 - 99 mg/dL 539 (H) 767 (H) 341 (H) 277 (H) 352 (H)  Diabetes history: DM 2 Outpatient Diabetes medications:  Novolog 0-15 units, Levemir 15 units bid Current orders for Inpatient glycemic control:  Novolog resistant q 4 hours Levemir 15 units bid Vital 1.5-50 cc/hr Novolog 4 units q 4 hours Inpatient Diabetes Program Recommendations:   Consider increasing Levemir to 22 units bid.   Thanks Beryl Meager, RN, BC-ADM Inpatient Diabetes Coordinator Pager [phone] (8a-5p)

## 2019-05-17 NOTE — Progress Notes (Signed)
2000- Patient heart rate increasing up to 115, RR up to 28, PO2 SATs down to 84%, with increased work of breathing. Call to RT to make aware and to switch patient over to full support from CPAP, RT will be up shortly. 2030- Patient PO2 sat down to 79%, PO2 breath given and sat increases to 91%. Patient HR continues to increase with aprox 16 beat run of SVT. Respirations appeared labored, RT at the bedside to switch patient to [facility]. Patient appears comfortable on PRVC, HR up to 110, and PO2 sats >90.

## 2019-05-18 ENCOUNTER — Inpatient Hospital Stay (HOSPITAL_COMMUNITY): Payer: BC Managed Care – PPO

## 2019-05-18 DIAGNOSIS — G9341 Metabolic encephalopathy: Secondary | ICD-10-CM

## 2019-05-18 LAB — GLUCOSE, CAPILLARY
Glucose-Capillary: 163 mg/dL — ABNORMAL HIGH (ref 70–99)
Glucose-Capillary: 197 mg/dL — ABNORMAL HIGH (ref 70–99)
Glucose-Capillary: 214 mg/dL — ABNORMAL HIGH (ref 70–99)
Glucose-Capillary: 221 mg/dL — ABNORMAL HIGH (ref 70–99)
Glucose-Capillary: 254 mg/dL — ABNORMAL HIGH (ref 70–99)
Glucose-Capillary: 266 mg/dL — ABNORMAL HIGH (ref 70–99)
Glucose-Capillary: 281 mg/dL — ABNORMAL HIGH (ref 70–99)

## 2019-05-18 LAB — CBC
HCT: 30.3 % — ABNORMAL LOW (ref 36.0–46.0)
Hemoglobin: 9.2 g/dL — ABNORMAL LOW (ref 12.0–15.0)
MCH: 25.6 pg — ABNORMAL LOW (ref 26.0–34.0)
MCHC: 30.4 g/dL (ref 30.0–36.0)
MCV: 84.2 fL (ref 80.0–100.0)
Platelets: 197 10*3/uL (ref 150–400)
RBC: 3.6 MIL/uL — ABNORMAL LOW (ref 3.87–5.11)
RDW: 16.3 % — ABNORMAL HIGH (ref 11.5–15.5)
WBC: 16 10*3/uL — ABNORMAL HIGH (ref 4.0–10.5)
nRBC: 0.7 % — ABNORMAL HIGH (ref 0.0–0.2)

## 2019-05-18 LAB — BASIC METABOLIC PANEL
Anion gap: 9 (ref 5–15)
BUN: 47 mg/dL — ABNORMAL HIGH (ref 8–23)
CO2: 33 mmol/L — ABNORMAL HIGH (ref 22–32)
Calcium: 8.4 mg/dL — ABNORMAL LOW (ref 8.9–10.3)
Chloride: 97 mmol/L — ABNORMAL LOW (ref 98–111)
Creatinine, Ser: 0.57 mg/dL (ref 0.44–1.00)
GFR calc Af Amer: >60 mL/min (ref >60)
GFR calc non Af Amer: >60 mL/min (ref >60)
Glucose, Bld: 229 mg/dL — ABNORMAL HIGH (ref 70–99)
Potassium: 5.3 mmol/L — ABNORMAL HIGH (ref 3.5–5.1)
Sodium: 139 mmol/L (ref 135–145)

## 2019-05-18 LAB — MAGNESIUM: Magnesium: 2.3 mg/dL (ref 1.7–2.4)

## 2019-05-18 LAB — PHOSPHORUS: Phosphorus: 2.6 mg/dL (ref 2.5–4.6)

## 2019-05-18 MED ORDER — INSULIN DETEMIR 100 UNIT/ML ~~LOC~~ SOLN
20.0000 [IU] | Freq: Two times a day (BID) | SUBCUTANEOUS | Status: DC
  Administered 2019-05-18 – 2019-05-22 (×8): 20 [IU] via SUBCUTANEOUS
  Filled 2019-05-18 (×9): qty 0.2

## 2019-05-18 MED ORDER — CLONAZEPAM 0.5 MG PO TABS
0.5000 mg | ORAL_TABLET | Freq: Two times a day (BID) | ORAL | Status: DC
  Administered 2019-05-18 – 2019-05-19 (×2): 0.5 mg
  Filled 2019-05-18 (×2): qty 1

## 2019-05-18 MED ORDER — SODIUM CHLORIDE 0.9 % IV SOLN
1.0000 g | INTRAVENOUS | Status: AC
  Administered 2019-05-18 – 2019-05-20 (×3): 1 g via INTRAVENOUS
  Filled 2019-05-18: qty 10
  Filled 2019-05-18 (×2): qty 1

## 2019-05-18 MED ORDER — DEXMEDETOMIDINE HCL IN NACL 400 MCG/100ML IV SOLN
0.4000 ug/kg/h | INTRAVENOUS | Status: DC
  Administered 2019-05-18: 0.9 ug/kg/h via INTRAVENOUS
  Administered 2019-05-18: 0.4 ug/kg/h via INTRAVENOUS
  Administered 2019-05-18: 0.8 ug/kg/h via INTRAVENOUS
  Administered 2019-05-19 (×3): 1 ug/kg/h via INTRAVENOUS
  Administered 2019-05-19: 0.9 ug/kg/h via INTRAVENOUS
  Administered 2019-05-21 – 2019-05-22 (×4): 0.7 ug/kg/h via INTRAVENOUS
  Filled 2019-05-18 (×9): qty 100

## 2019-05-18 NOTE — Progress Notes (Addendum)
NAME:  Deborah Miller, MRN:  [id number], DOB:  [birth year], LOS: 5 ADMISSION DATE:  May 25, 2019, CONSULTATION DATE:  05/25/19 REFERRING MD:  Belia Heman, CHIEF COMPLAINT:  Dyspnea   Brief History   64 y/o female with COVID 19 pneumonia admitted on 1/1 to [facility] ICU, treated with BIPAP, high flow nasal cannula, remdesivir and ivermectin.  Developed sub cutaneous emphysema on 1/3.  developed progressive hypoxemia and dyspnea ultimately requiring intubation on 1/7, transferred to [facility] on 1/8. COVID infection initially diagnosed 12/23  Past Medical History  osa htn Morbid obesity gerd dm2 Cor pulmonale Asthma Thyroid nodule  Significant Hospital Events   1/1 admit [facility], bipap 1/3 sub q emphysema 1/7 intubated 1/12 tolerates pressure support weaning  Consults:  PCCM  Procedures:  ETT 1/7 >  R IJ CVL  1/7 >   Significant Diagnostic Tests:  1/2 LE Doppler > DVT left peroneal vein  Micro Data:  12/23 sars cov 2 > pos 1/8 >> nml flora  Antimicrobials:  12/31 Remdesivir > 1/4 1/4 doxycycline> 1/8 1/5 Ivermectin > 1/6  Interim history/subjective:   Afebrile Sedated on Versed/fentanyl drips  ToleRated pressure support weaning 1/12  Objective   Blood pressure 105/62, pulse (!) 107, temperature 98.2 F (36.8 C), temperature source Axillary, resp. rate (!) 27, height 4\' 7"  (1.397 m), weight 99.2 kg, SpO2 (!) 88 %.    Vent Mode: PSV;CPAP FiO2 (%):  [40 %-60 %] 45 % Set Rate:  [25 bmp] 25 bmp Vt Set:  [350 mL] 350 mL PEEP:  [5 cmH20-10 cmH20] 5 cmH20 Pressure Support:  [10 cmH20] 10 cmH20   Intake/Output Summary (Last 24 hours) at 05/18/2019 1106 Last data filed at 05/18/2019 0900 Gross per 24 hour  Intake 1776.72 ml  Output 1650 ml  Net 126.72 ml   Filed Weights   05/16/19 1300 05/17/19 0500 05/18/19 0500  Weight: 98.6 kg 97.1 kg 99.2 kg    Examination:  General: ill appearing middle-aged woman, intubated, NAD HENT: [place]/AT ETT in place, no pallor, no  icterus PULM: Bilateral decreased breath sounds, no accessory muscle use  CV: RRR, no r/m/g GI: BS+, soft, nontender MSK: obese; o/w normal bulk and tone Extrem: no c/c/e Neuro: sedated RASS -3, does not follow commands  Chest x-ray 1/13 personally reviewed, ET tube better position, no change in bilateral patchy opacities  Labs show uncontrolled hyperglycemia, mild hyperkalemia, stable renal function, mild stable leukocytosis  Resolved Hospital Problem list     Assessment & Plan:  ARDS due to COVID 19 pneumonia> complicated by pneumomediastinum Continue mechanical ventilation per ARDS protocol Target TVol 6-8cc/kg IBW Target Plateau Pressure < 30cm H20 Target driving pressure less than 15 cm of water Target PaO2 55-65: titrate PEEP/FiO2 per protocol Since P/F ratio is improved, can maintain in supine position Target CVP less than 4, diurese daily Lasix as renal function permits Ventilator associated pneumonia prevention protocol Making progress with spontaneous breathing trials  -Completed remdesivir and steroid course  Asthma exacerbation duoneb scheduled Dc'd Solumedrol  LABA/ICS  Severe vent dyssynchrony -improved --Transition off versed, fentanyl infusions, use Precedex instead, goal RASS -1 --No paralytic --dc oxycodone and decrease clonazepam to 0.5 twice daily (home Xanax]  LLE DVT lovenox bid  DM2 SSI Increase detemir 20 units every 12 , hopefully discontinuing steroid should help Ct tube feed coverage 4 units every 4  Fever with malodorous urine-send urine culture, empirically start ceftriaxone while awaiting culture, allergy noted was cramping so will override  Summary-overall improving, hopeful that  we will make progress with weaning to extubation once more awake as sedation being decreased and changing to Precedex  Best practice:  Diet: tube feeding Pain/Anxiety/Delirium protocol (if indicated): precedex, RASS target -1 VAP protocol (if indicated):  yes DVT prophylaxis: lovenox Rx dose GI prophylaxis: Pantoprazole  Glucose control: SSI Mobility: bed  Code Status: full Family Communication: daughter 1/12 Disposition:  ICU  The patient is critically ill with multiple organ systems failure and requires high complexity decision making for assessment and support, frequent evaluation and titration of therapies, application of advanced monitoring technologies and extensive interpretation of multiple databases. Critical Care Time devoted to patient care services described in this note independent of APP/resident  time is 35 minutes.     Kara Mead MD. Shade Flood. [place] [facility]  If no response to pager , please call [phone]   05/18/2019

## 2019-05-18 NOTE — Progress Notes (Signed)
MD notified:  Pt spiked temp 100.5 axillary.  Not too alarming but she's been riding the fence with a low grade temp and her urine is malodorous and smells like pure ammonia.  Purewick is only about 60% effective.  Wondered if we could re-place a foley and culture her urine.   She's successfully transitioned to Precedex but still not following commands. Pt's tmax 100.5 axillary; MD ordered urine culture and started IV abx. Still not following commands by end of shift.

## 2019-05-19 LAB — CBC
HCT: 29.6 % — ABNORMAL LOW (ref 36.0–46.0)
Hemoglobin: 9.4 g/dL — ABNORMAL LOW (ref 12.0–15.0)
MCH: 26.4 pg (ref 26.0–34.0)
MCHC: 31.8 g/dL (ref 30.0–36.0)
MCV: 83.1 fL (ref 80.0–100.0)
Platelets: 167 10*3/uL (ref 150–400)
RBC: 3.56 MIL/uL — ABNORMAL LOW (ref 3.87–5.11)
RDW: 16.2 % — ABNORMAL HIGH (ref 11.5–15.5)
WBC: 15.8 10*3/uL — ABNORMAL HIGH (ref 4.0–10.5)
nRBC: 1.3 % — ABNORMAL HIGH (ref 0.0–0.2)

## 2019-05-19 LAB — URINALYSIS, ROUTINE W REFLEX MICROSCOPIC
Bilirubin Urine: NEGATIVE
Glucose, UA: 50 mg/dL — AB
Hgb urine dipstick: NEGATIVE
Ketones, ur: NEGATIVE mg/dL
Nitrite: NEGATIVE
Protein, ur: 30 mg/dL — AB
Specific Gravity, Urine: 1.017 (ref 1.005–1.030)
WBC, UA: >50 WBC/hpf — ABNORMAL HIGH (ref 0–5)
pH: 7 (ref 5.0–8.0)

## 2019-05-19 LAB — GLUCOSE, CAPILLARY
Glucose-Capillary: 172 mg/dL — ABNORMAL HIGH (ref 70–99)
Glucose-Capillary: 250 mg/dL — ABNORMAL HIGH (ref 70–99)
Glucose-Capillary: 327 mg/dL — ABNORMAL HIGH (ref 70–99)
Glucose-Capillary: 230 mg/dL — ABNORMAL HIGH (ref 70–99)
Glucose-Capillary: 198 mg/dL — ABNORMAL HIGH (ref 70–99)

## 2019-05-19 LAB — POCT I-STAT 7, (LYTES, BLD GAS, ICA,H+H)
Acid-Base Excess: 15 mmol/L — ABNORMAL HIGH (ref 0.0–2.0)
Bicarbonate: 38.2 mmol/L — ABNORMAL HIGH (ref 20.0–28.0)
Calcium, Ion: 1.12 mmol/L — ABNORMAL LOW (ref 1.15–1.40)
HCT: 26 % — ABNORMAL LOW (ref 36.0–46.0)
Hemoglobin: 8.8 g/dL — ABNORMAL LOW (ref 12.0–15.0)
O2 Saturation: 87 %
Patient temperature: 98.6
Potassium: 4.5 mmol/L (ref 3.5–5.1)
Sodium: 140 mmol/L (ref 135–145)
TCO2: 39 mmol/L — ABNORMAL HIGH (ref 22–32)
pCO2 arterial: 40.8 mmHg (ref 32.0–48.0)
pH, Arterial: 7.58 — ABNORMAL HIGH (ref 7.350–7.450)
pO2, Arterial: 45 mmHg — ABNORMAL LOW (ref 83.0–108.0)

## 2019-05-19 LAB — BASIC METABOLIC PANEL
Anion gap: 9 (ref 5–15)
BUN: 40 mg/dL — ABNORMAL HIGH (ref 8–23)
CO2: 33 mmol/L — ABNORMAL HIGH (ref 22–32)
Calcium: 7.9 mg/dL — ABNORMAL LOW (ref 8.9–10.3)
Chloride: 98 mmol/L (ref 98–111)
Creatinine, Ser: 0.62 mg/dL (ref 0.44–1.00)
GFR calc Af Amer: >60 mL/min (ref >60)
GFR calc non Af Amer: >60 mL/min (ref >60)
Glucose, Bld: 235 mg/dL — ABNORMAL HIGH (ref 70–99)
Potassium: 4.5 mmol/L (ref 3.5–5.1)
Sodium: 140 mmol/L (ref 135–145)

## 2019-05-19 LAB — MAGNESIUM: Magnesium: 2 mg/dL (ref 1.7–2.4)

## 2019-05-19 LAB — PHOSPHORUS: Phosphorus: 3.1 mg/dL (ref 2.5–4.6)

## 2019-05-19 MED ORDER — MIDAZOLAM 50MG/50ML (1MG/ML) PREMIX INFUSION
0.5000 mg/h | INTRAVENOUS | Status: DC
  Administered 2019-05-20: 1 mg/h via INTRAVENOUS
  Filled 2019-05-19: qty 50

## 2019-05-19 MED ORDER — MIDAZOLAM HCL 2 MG/2ML IJ SOLN: 2.0000 mg | INTRAMUSCULAR | Status: DC | PRN

## 2019-05-19 MED ORDER — FUROSEMIDE 10 MG/ML IJ SOLN
40.0000 mg | Freq: Two times a day (BID) | INTRAMUSCULAR | Status: DC
  Administered 2019-05-19 – 2019-05-22 (×6): 40 mg via INTRAVENOUS
  Filled 2019-05-19 (×6): qty 4

## 2019-05-19 MED ORDER — ACETAMINOPHEN 325 MG PO TABS
650.0000 mg | ORAL_TABLET | Freq: Four times a day (QID) | ORAL | Status: DC | PRN
  Administered 2019-05-19 – 2019-05-23 (×7): 650 mg via ORAL
  Filled 2019-05-19 (×7): qty 2

## 2019-05-19 MED ORDER — IPRATROPIUM-ALBUTEROL 0.5-2.5 (3) MG/3ML IN SOLN: 3.0000 mL | RESPIRATORY_TRACT | Status: DC | PRN

## 2019-05-19 MED ORDER — FENTANYL CITRATE (PF) 100 MCG/2ML IJ SOLN: 50.0000 ug | INTRAMUSCULAR | Status: DC | PRN

## 2019-05-19 MED ORDER — INSULIN ASPART 100 UNIT/ML ~~LOC~~ SOLN
8.0000 [IU] | SUBCUTANEOUS | Status: DC
  Administered 2019-05-19 – 2019-05-21 (×10): 8 [IU] via SUBCUTANEOUS

## 2019-05-19 MED ORDER — FENTANYL CITRATE (PF) 100 MCG/2ML IJ SOLN
50.0000 ug | INTRAMUSCULAR | Status: DC | PRN
  Administered 2019-05-19: 10:00:00 50 ug via INTRAVENOUS
  Administered 2019-05-19 – 2019-05-21 (×5): 100 ug via INTRAVENOUS
  Filled 2019-05-19 (×6): qty 2

## 2019-05-19 MED ORDER — MIDAZOLAM HCL 2 MG/2ML IJ SOLN
2.0000 mg | INTRAMUSCULAR | Status: DC | PRN
  Administered 2019-05-19 – 2019-05-21 (×6): 2 mg via INTRAVENOUS
  Filled 2019-05-19 (×6): qty 2

## 2019-05-19 NOTE — Progress Notes (Signed)
Nutrition Follow-up RD working remotely.  DOCUMENTATION CODES:   Morbid obesity  INTERVENTION:   Continue TF via OGT:  Vital 1.5 at 50 ml/h (1200 ml per day).  Pro-stat 30 ml once daily.  Provides 1900 kcal, 96 gm protein, 912 ml free water daily.  Free water flushes 200 ml every 6 hours for a total of 1712 ml free water daily.  NUTRITION DIAGNOSIS:   Increased nutrient needs related to catabolic illness(COVID 19) as evidenced by estimated needs.  Ongoing   GOAL:   Patient will meet greater than or equal to 90% of their needs  Met with TF  MONITOR:   Weight trends, TF tolerance, Vent status, I & O's, Labs  ASSESSMENT:   64 year old female with past medical history of OSA, HTN, GERD, T2DM, asthma, and morbid obesity who was diagnosed with COVID 19 by PCP on 12/23. Patient presented to [facility] on 12/31 due to worsening symptoms where she was admitted to ICU for COVID pneumonia and hypoxemic respiratory failure.  Patient remains intubated on ventilator support MV: 11.3 L/min Temp (24hrs), Avg:100 F (37.8 C), Min:98.3 F (36.8 C), Max:101.8 F (38.8 C)   OG tube in place, patient receiving Vital 1.5 at 50 ml/h with Pro-stat 30 ml once daily providing 1900 kcal, 96 gm protein, 917 ml free water daily. Free water flushes 200 ml every 6 hours. Patient tolerating TF well.   Labs reviewed.  CBG's: [phone]  Medications reviewed and include Lasix, Vitamin C, Novolog SSI every 4 hours + TF coverage every 4 hours, Levemir, thiamine, vitamin D3.  Weight up to 99.6 kg today from 85.1 kg on admission I/O + 2.6 L  Diet Order:   Diet Order    None      EDUCATION NEEDS:   No education needs have been identified at this time  Skin:  Skin Assessment: Reviewed RN Assessment  Last BM:  1/12 type 6  Height:   Ht Readings from Last 1 Encounters:  05/14/19 '4\' 7"'  (1.397 m)    Weight:   Wt Readings from Last 1 Encounters:  05/19/19 99.6 kg    Ideal Body  Weight:  41.6 kg  BMI:  Body mass index is 51.03 kg/m.  Estimated Nutritional Needs:   Kcal:  1800-2000  Protein:  83-104 gm  Fluid:  > 1.7 L/day    Molli Barrows, RD, LDN, [place] Pager [phone] After Hours Pager [phone]

## 2019-05-19 NOTE — Plan of Care (Signed)

## 2019-05-19 NOTE — Progress Notes (Signed)
ANTICOAGULATION CONSULT NOTE  Pharmacy Consult for Enoxaparin Indication: DVT  Patient Measurements: Height: 4\' 7"  (139.7 cm) Weight: 219 lb 9.3 oz (99.6 kg) IBW/kg (Calculated) : 34  Vital Signs: Temp: 99.7 F (37.6 C) (01/14 1000) Temp Source: Axillary (01/14 1000) BP: 118/72 (01/14 1214) Pulse Rate: 99 (01/14 1214)  Labs: Recent Labs    05/16/19 2210 05/17/19 0514 05/17/19 0514 05/18/19 0416 05/18/19 0416 05/19/19 0220 05/19/19 0500  HGB  --  10.0*   < > 9.2*   < > 8.8* 9.4*  HCT  --  32.4*   < > 30.3*  --  26.0* 29.6*  PLT  --  240  --  197  --   --  167  APTT 29  --   --   --   --   --   --   LABPROT 14.0  --   --   --   --   --   --   INR 1.1  --   --   --   --   --   --   CREATININE  --  0.66  --  0.57  --   --  0.62   < > = values in this interval not displayed.    Estimated Creatinine Clearance: 67.5 mL/min (by C-G formula based on SCr of 0.62 mg/dL).   Assessment: Patient is a 64yo female admitted for Covid, with new DVT. Venous ultrasound revealed an occlusive DVT involving the left peroneal vein. Of note patient was transitioned from Lovenox (1/2-1/7) to apixaban (1/7-1/8) at [facility] but now back to Lovenox.   SCr remains low/stable.  Hgb slowly decreased to 9.4 (baseline 12.1), and Plt decreased but WNL. Bleeding: RN documents frank red rectal bleeding related to hemorrhoids.  No further bleeding reported.     Plan:   Continue Enoxaparin 1mg /kg (100mg ) SQ q12h.  CBC and Bmet q72h  [facility] PharmD, BCPS Clinical pharmacist phone 7am- 5pm: [phone] 05/19/2019 1:47 PM

## 2019-05-19 NOTE — Progress Notes (Signed)
Spoke with floor RN regarding request for midline to D/C central .  Concerned that midline may not give enough access as midlines only come in signal lumens.  Pt reportedly poor veins.  RN to contact MD to see if PICC would be suitable.  Will follow up

## 2019-05-19 NOTE — Progress Notes (Signed)
E-link meeting completed with family at this time.

## 2019-05-19 NOTE — Progress Notes (Signed)
eLink Physician-Brief Progress Note Patient Name: Deborah Miller DOB: 18-Oct-1955 MRN: [id number]   Date of Service  05/19/2019  HPI/Events of Note  Pt is hypotensive into the 80's SBP despite minimal dosing on the Precedex infusion  eICU Interventions  Precedex discontinued and Versed infusion substituted.        Thomasene Lot Cordarius Benning 05/19/2019, 11:53 PM

## 2019-05-19 NOTE — Progress Notes (Signed)
MD notified of 101.8 axillary temp. Urine cultures pending. ABX started earlier in the day for concern of UTI. New order for PRN Tylenol.  Also made MD aware of frank red rectal bleeding. Appears to be related to hemorrhoids that are present. Morning CBC drawn. Discussed current anticoagulation medication regimen. Per MD, make day team aware for further f/u if needed. No urgent intervention at this time.

## 2019-05-19 NOTE — Progress Notes (Signed)
NAME:  Deborah Miller, MRN:  [id number], DOB:  Aug 17, 1955, LOS: 68 ADMISSION DATE:  Jun 08, 2019, CONSULTATION DATE:  1/8 REFERRING MD: Mortimer Fries, CHIEF COMPLAINT:  dyspnea   Brief History   64 y/o female with COVID 19 pneumonia admitted on 1/1 to [facility] ICU, treated with BIPAP, high flow nasal cannula, remdesivir and ivermectin.  Developed sub cutaneous emphysema on 1/3.  developed progressive hypoxemia and dyspnea ultimately requiring intubation on 1/7, transferred to [facility] on 1/8. COVID infection initially diagnosed 12/23   Past Medical History  osa htn Morbid obesity gerd dm2 Cor pulmonale Asthma Thyroid nodule  Significant Hospital Events   1/1 admit [facility], bipap 1/3 sub q emphysema 1/7 intubated 1/12 tolerates pressure support weaning 1/13 weaned 13 hours, sleepy  Consults:  PCCM  Procedures:  ETT 1/7 >  R IJ CVL  1/7 >   Significant Diagnostic Tests:  1/2 LE Doppler > DVT left peroneal vein  Micro Data:  12/23 sars cov 2 > pos 1/8 >> nml flora  Antimicrobials:  12/31 Remdesivir > 1/4 1/4 doxycycline> 1/8 1/5 Ivermectin > 1/6  Interim history/subjective:  Weaned 13 hours, had increased work of breathing at end of day No wheezing Drowsy   Objective   Blood pressure 139/77, pulse 95, temperature (!) 101 F (38.3 C), temperature source Axillary, resp. rate (!) 32, height 4\' 7"  (1.397 m), weight 99.6 kg, SpO2 90 %.    Vent Mode: PSV;CPAP FiO2 (%):  [45 %-60 %] 50 % Set Rate:  [25 bmp] 25 bmp Vt Set:  [350 mL] 350 mL PEEP:  [5 cmH20] 5 cmH20 Pressure Support:  [10 cmH20] 10 cmH20 Plateau Pressure:  [19 cmH20] 19 cmH20   Intake/Output Summary (Last 24 hours) at 05/19/2019 0851 Last data filed at 05/19/2019 0600 Gross per 24 hour  Intake 1761.79 ml  Output 1750 ml  Net 11.79 ml   Filed Weights   05/17/19 0500 05/18/19 0500 05/19/19 0400  Weight: 97.1 kg 99.2 kg 99.6 kg    Examination:  General:  In bed on vent HENT: NCAT ETT in place PULM:  Coarse breath sounds B, vent supported breathing CV: RRR, no mgr GI: BS+, soft, nontender MSK: normal bulk and tone Neuro: sedated on vent  1/13 CXR Left lower lobe infiltrate seen on my review of her CXR  Resolved Hospital Problem list     Assessment & Plan:  ARDS due to COVID 19 pneumonia > improving oxygenation and ventilator mechanics Severe hypoxemia on this morning's ABG Pressure support as long as tolerated VAP prevention Diurese more CXR in AM  Asthma exacerbation Change duoneb to prn Continue brovana/pulmicort  Severe vent dyssynchrony> improved Toxic metabolic encephalopathy Stop clonazepam RASS target 0 to -1  LLE DVT lovenox bid  DM2 SSI  Best practice:  Diet: continue tube feeding Pain/Anxiety/Delirium protocol (if indicated): yes, RASS score 0 to -1, fentanyl prn, precedex, versed prn VAP protocol (if indicated): yes DVT prophylaxis: lovenox full dose GI prophylaxis: Pantoprazole for stress ulcer prophylaxis Glucose control: SSI Mobility: bed rest Code Status: full Family Communication: I called her daughter Alyse Low for an update today Disposition: remain in ICU  Labs   CBC: Recent Labs  Lab 08-Jun-2019 0515 06/08/19 1811 05/15/19 0319 05/15/19 1703 05/16/19 0400 05/17/19 0514 05/18/19 0416 05/19/19 0220 05/19/19 0500  WBC 13.8*   < > 16.2*  --  14.3* 17.4* 16.0*  --  15.8*  NEUTROABS 12.5*  --   --   --   --   --   --   --   --  HGB 12.7   < > 11.5*   < > 10.4* 10.0* 9.2* 8.8* 9.4*  HCT 40.4   < > 38.7   < > 34.5* 32.4* 30.3* 26.0* 29.6*  MCV 81.0   < > 86.2  --  85.0 83.3 84.2  --  83.1  PLT 231   < > 179  --  177 240 197  --  167   < > = values in this interval not displayed.    Basic Metabolic Panel: Recent Labs  Lab 05/14/19 0405 05/14/19 0405 05/14/19 1544 05/14/19 1708 05/15/19 0319 05/15/19 1703 05/16/19 0400 05/17/19 0514 05/18/19 0416 05/19/19 0220 05/19/19 0500  NA 135   < >   < >  --  137   < > 139 139 139 140  140  K 4.5   < >   < >  --  4.8   < > 5.3* 5.2* 5.3* 4.5 4.5  CL 96*   < >  --   --  97*  --  98 96* 97*  --  98  CO2 28   < >  --   --  32  --  33* 33* 33*  --  33*  GLUCOSE 261*   < >  --   --  261*  --  301* 204* 229*  --  235*  BUN 33*   < >  --   --  38*  --  46* 56* 47*  --  40*  CREATININE 0.72   < >  --   --  0.74  --  0.67 0.66 0.57  --  0.62  CALCIUM 8.3*   < >  --   --  8.4*  --  8.4* 8.7* 8.4*  --  7.9*  MG 2.4  --   --  2.5* 2.6*  --   --   --  2.3  --  2.0  PHOS 5.3*  --   --  4.1 3.4  --   --   --  2.6  --  3.1   < > = values in this interval not displayed.   GFR: Estimated Creatinine Clearance: 67.5 mL/min (by C-G formula based on SCr of 0.62 mg/dL). Recent Labs  Lab 05/16/19 0400 05/17/19 0514 05/18/19 0416 05/19/19 0500  WBC 14.3* 17.4* 16.0* 15.8*    Liver Function Tests: Recent Labs  Lab 06/06/2019 0515  AST 29  ALT 31  ALKPHOS 79  BILITOT 0.7  PROT 5.3*  ALBUMIN 2.2*   No results for input(s): LIPASE, AMYLASE in the last 168 hours. No results for input(s): AMMONIA in the last 168 hours.  ABG    Component Value Date/Time   PHART 7.580 (H) 05/19/2019 0220   PCO2ART 40.8 05/19/2019 0220   PO2ART 45.0 (L) 05/19/2019 0220   HCO3 38.2 (H) 05/19/2019 0220   TCO2 39 (H) 05/19/2019 0220   O2SAT 87.0 05/19/2019 0220     Coagulation Profile: Recent Labs  Lab 05/16/19 2210  INR 1.1    Cardiac Enzymes: No results for input(s): CKTOTAL, CKMB, CKMBINDEX, TROPONINI in the last 168 hours.  HbA1C: Hgb A1c MFr Bld  Date/Time Value Ref Range Status  05/05/2019 12:48 PM 7.6 (H) 4.8 - 5.6 % Final    Comment:    (NOTE) Pre diabetes:          5.7%-6.4% Diabetes:              >6.4% Glycemic control for   <7.0% adults  with diabetes   10/15/2017 12:37 PM 6.5 (H) 4.8 - 5.6 % Final    Comment:    (NOTE) Pre diabetes:          5.7%-6.4% Diabetes:              >6.4% Glycemic control for   <7.0% adults with diabetes     CBG: Recent Labs  Lab  05/18/19 1608 05/18/19 2017 05/18/19 2332 05/19/19 0356 05/19/19 0735  GLUCAP 281* 163* 214* 172* 250*     Critical care time: 35 minutes    Heber [place], MD [address] PCCM Pager: [phone] Cell: [phone] If no response, call [phone]

## 2019-05-20 ENCOUNTER — Inpatient Hospital Stay (HOSPITAL_COMMUNITY): Payer: BC Managed Care – PPO

## 2019-05-20 ENCOUNTER — Encounter (HOSPITAL_COMMUNITY): Payer: Self-pay | Admitting: Pulmonary Disease

## 2019-05-20 ENCOUNTER — Inpatient Hospital Stay: Payer: Self-pay

## 2019-05-20 ENCOUNTER — Other Ambulatory Visit: Payer: Self-pay

## 2019-05-20 LAB — CBC WITH DIFFERENTIAL/PLATELET
Abs Immature Granulocytes: 0.3 10*3/uL — ABNORMAL HIGH (ref 0.00–0.07)
Basophils Absolute: 0 10*3/uL (ref 0.0–0.1)
Basophils Relative: 0 %
Eosinophils Absolute: 0.2 10*3/uL (ref 0.0–0.5)
Eosinophils Relative: 1 %
HCT: 30.4 % — ABNORMAL LOW (ref 36.0–46.0)
Hemoglobin: 9.3 g/dL — ABNORMAL LOW (ref 12.0–15.0)
Immature Granulocytes: 2 %
Lymphocytes Relative: 12 %
Lymphs Abs: 1.9 10*3/uL (ref 0.7–4.0)
MCH: 25.8 pg — ABNORMAL LOW (ref 26.0–34.0)
MCHC: 30.6 g/dL (ref 30.0–36.0)
MCV: 84.2 fL (ref 80.0–100.0)
Monocytes Absolute: 0.3 10*3/uL (ref 0.1–1.0)
Monocytes Relative: 2 %
Neutro Abs: 13.9 10*3/uL — ABNORMAL HIGH (ref 1.7–7.7)
Neutrophils Relative %: 83 %
Platelets: 190 10*3/uL (ref 150–400)
RBC: 3.61 MIL/uL — ABNORMAL LOW (ref 3.87–5.11)
RDW: 16.1 % — ABNORMAL HIGH (ref 11.5–15.5)
WBC: 16.6 10*3/uL — ABNORMAL HIGH (ref 4.0–10.5)
nRBC: 1.1 % — ABNORMAL HIGH (ref 0.0–0.2)

## 2019-05-20 LAB — BASIC METABOLIC PANEL
Anion gap: 10 (ref 5–15)
BUN: 39 mg/dL — ABNORMAL HIGH (ref 8–23)
CO2: 37 mmol/L — ABNORMAL HIGH (ref 22–32)
Calcium: 8.2 mg/dL — ABNORMAL LOW (ref 8.9–10.3)
Chloride: 96 mmol/L — ABNORMAL LOW (ref 98–111)
Creatinine, Ser: 0.59 mg/dL (ref 0.44–1.00)
GFR calc Af Amer: >60 mL/min (ref >60)
GFR calc non Af Amer: >60 mL/min (ref >60)
Glucose, Bld: 87 mg/dL (ref 70–99)
Potassium: 3.7 mmol/L (ref 3.5–5.1)
Sodium: 143 mmol/L (ref 135–145)

## 2019-05-20 LAB — GLUCOSE, CAPILLARY
Glucose-Capillary: 143 mg/dL — ABNORMAL HIGH (ref 70–99)
Glucose-Capillary: 178 mg/dL — ABNORMAL HIGH (ref 70–99)
Glucose-Capillary: 264 mg/dL — ABNORMAL HIGH (ref 70–99)
Glucose-Capillary: 267 mg/dL — ABNORMAL HIGH (ref 70–99)
Glucose-Capillary: 294 mg/dL — ABNORMAL HIGH (ref 70–99)
Glucose-Capillary: 295 mg/dL — ABNORMAL HIGH (ref 70–99)
Glucose-Capillary: 82 mg/dL (ref 70–99)

## 2019-05-20 LAB — URINE CULTURE: Culture: NO GROWTH

## 2019-05-20 MED ORDER — SODIUM CHLORIDE 0.9% FLUSH
10.0000 mL | Freq: Two times a day (BID) | INTRAVENOUS | Status: DC
  Administered 2019-05-20 – 2019-05-21 (×2): 10 mL
  Administered 2019-05-21: 22:00:00 20 mL
  Administered 2019-05-22 – 2019-05-24 (×4): 10 mL

## 2019-05-20 MED ORDER — QUETIAPINE FUMARATE 50 MG PO TABS: 50.0000 mg | ORAL_TABLET | Freq: Two times a day (BID) | ORAL | Status: DC

## 2019-05-20 MED ORDER — POTASSIUM CHLORIDE 20 MEQ/15ML (10%) PO SOLN
20.0000 meq | Freq: Once | ORAL | Status: AC
  Administered 2019-05-20: 14:00:00 20 meq
  Filled 2019-05-20: qty 15

## 2019-05-20 MED ORDER — SODIUM CHLORIDE 0.9% FLUSH: 10.0000 mL | INTRAVENOUS | Status: DC | PRN

## 2019-05-20 MED ORDER — QUETIAPINE FUMARATE 50 MG PO TABS
50.0000 mg | ORAL_TABLET | Freq: Two times a day (BID) | ORAL | Status: DC
  Administered 2019-05-20 – 2019-05-22 (×4): 50 mg
  Filled 2019-05-20 (×5): qty 1

## 2019-05-20 NOTE — Progress Notes (Signed)
Per family's request, this number is being added to dial for Face Time with patient. [phone]

## 2019-05-20 NOTE — Progress Notes (Signed)
Peripherally Inserted Central Catheter/Midline Placement  The IV Nurse has discussed with the patient and/or persons authorized to consent for the patient, the purpose of this procedure and the potential benefits and risks involved with this procedure.  The benefits include less needle sticks, lab draws from the catheter, and the patient may be discharged home with the catheter. Risks include, but not limited to, infection, bleeding, blood clot (thrombus formation), and puncture of an artery; nerve damage and irregular heartbeat and possibility to perform a PICC exchange if needed/ordered by physician.  Alternatives to this procedure were also discussed.  Bard Power PICC patient education guide, fact sheet on infection prevention and patient information card has been provided to patient /or left at bedside.    PICC/Midline Placement Documentation  PICC Double Lumen 05/20/19 PICC Left Basilic 42 cm 0 cm (Active)  Indication for Insertion or Continuance of Line Prolonged intravenous therapies 05/20/19 1800  Exposed Catheter (cm) 0 cm 05/20/19 1800  Site Assessment Clean;Dry;Intact 05/20/19 1800  Lumen #1 Status Flushed;Blood return noted 05/20/19 1800  Lumen #2 Status Flushed;Blood return noted 05/20/19 1800  Dressing Type Transparent 05/20/19 1800  Dressing Status Clean;Dry;Intact;Antimicrobial disc in place 05/20/19 1800  Dressing Change Due 05/27/19 05/20/19 1800       Stacie Glaze Horton 05/20/2019, 6:46 PM

## 2019-05-20 NOTE — Progress Notes (Signed)
E-Link video chat completed with pt's family at this time.

## 2019-05-20 NOTE — Progress Notes (Signed)
NAME:  Deborah Miller, MRN:  [id number], DOB:  1956/03/08, LOS: 7 ADMISSION DATE:  05/21/2019, CONSULTATION DATE:  1/8 REFERRING MD: Mortimer Fries, CHIEF COMPLAINT:  dyspnea   Brief History   64 y/o female with COVID 19 pneumonia admitted on 1/1 to [facility] ICU, treated with BIPAP, high flow nasal cannula, remdesivir and ivermectin.  Developed sub cutaneous emphysema on 1/3.  developed progressive hypoxemia and dyspnea ultimately requiring intubation on 1/7, transferred to [facility] on 1/8. COVID infection initially diagnosed 12/23  Past Medical History  OSA HTN Morbid obesity GERD DM2 Cor pulmonale Asthma Thyroid nodule  Significant Hospital Events   1/1 admit [facility], bipap 1/3 sub q emphysema 1/7 intubated 1/12 tolerates pressure support weaning 1/13 weaned 13 hours, sleepy  Consults:  PCCM  Procedures:  ETT 1/7 >  R IJ CVL  1/7 >   Significant Diagnostic Tests:  1/2 LE Doppler > DVT left peroneal vein  Micro Data:  12/23 sars cov 2 > pos 1/8 >> nml flora  Antimicrobials:  12/31 Remdesivir > 1/4 1/4 doxycycline> 1/8 1/5 Ivermectin > 1/6  Interim history/subjective:   Weaning Hypotensive with precedex Started on versed overnight  Objective   Blood pressure 111/66, pulse (!) 105, temperature (!) 100.8 F (38.2 C), temperature source Oral, resp. rate (!) 29, height 4\' 7"  (1.397 m), weight 96.4 kg, SpO2 95 %.    Vent Mode: PRVC FiO2 (%):  [50 %] 50 % Set Rate:  [25 bmp] 25 bmp Vt Set:  [350 mL] 350 mL PEEP:  [5 cmH20] 5 cmH20 Pressure Support:  [10 cmH20] 10 cmH20 Plateau Pressure:  [20 cmH20-25 cmH20] 25 cmH20   Intake/Output Summary (Last 24 hours) at 05/20/2019 4696 Last data filed at 05/20/2019 0500 Gross per 24 hour  Intake 2286.39 ml  Output 3100 ml  Net -813.61 ml   Filed Weights   05/18/19 0500 05/19/19 0400 05/20/19 0349  Weight: 99.2 kg 99.6 kg 96.4 kg    Examination:  General:  In bed on vent HENT: NCAT ETT in place PULM: Wheezing B, vent  supported breathing CV: RRR, no mgr GI: BS+, soft, nontender MSK: normal bulk and tone Neuro: sedated on vent  1/13 CXR Left lower lobe infiltrate seen on my review of her CXR  Resolved Hospital Problem list     Assessment & Plan:  ARDS due to COVID 19 pneumonia > improving oxygenation and ventilator mechanics Would like to extubate to heated high flow oxygen today when more awake Stop sedation Continue pressure support ventilation No more versed infusion  Hypokalemia Replace  Asthma exacerbation Continue duoneb prn Continue brovana/pulmicort  Severe vent dyssynchrony> improved Toxic metabolic encephalopathy RASS target 0 to -1 Add seroquel for sundowning No versed overnight except prn dosing  LLE DVT lovenox bid  DM2 SSI  Best practice:  Diet: continue tube feeding Pain/Anxiety/Delirium protocol (if indicated): yes, RASS score 0 to -1, fentanyl prn, precedex, versed prn VAP protocol (if indicated): yes DVT prophylaxis: lovenox full dose GI prophylaxis: Pantoprazole for stress ulcer prophylaxis Glucose control: SSI Mobility: bed rest Code Status: full Family Communication: I updated Christy by phone today Disposition: remain in ICU  Labs   CBC: Recent Labs  Lab 05/16/19 0400 05/16/19 0400 05/17/19 0514 05/18/19 0416 05/19/19 0220 05/19/19 0500 05/20/19 0350  WBC 14.3*  --  17.4* 16.0*  --  15.8* 16.6*  NEUTROABS  --   --   --   --   --   --  13.9*  HGB  10.4*   < > 10.0* 9.2* 8.8* 9.4* 9.3*  HCT 34.5*   < > 32.4* 30.3* 26.0* 29.6* 30.4*  MCV 85.0  --  83.3 84.2  --  83.1 84.2  PLT 177  --  240 197  --  167 190   < > = values in this interval not displayed.    Basic Metabolic Panel: Recent Labs  Lab 05/14/19 0405 05/14/19 0405 05/14/19 1544 05/14/19 1708 05/15/19 0319 05/15/19 1703 05/16/19 0400 05/16/19 0400 05/17/19 0514 05/18/19 0416 05/19/19 0220 05/19/19 0500 05/20/19 0350  NA 135   < >   < >  --  137   < > 139   < > 139 139 140  140 143  K 4.5   < >   < >  --  4.8   < > 5.3*   < > 5.2* 5.3* 4.5 4.5 3.7  CL 96*   < >  --   --  97*  --  98  --  96* 97*  --  98 96*  CO2 28   < >  --   --  32  --  33*  --  33* 33*  --  33* 37*  GLUCOSE 261*   < >  --   --  261*  --  301*  --  204* 229*  --  235* 87  BUN 33*   < >  --   --  38*  --  46*  --  56* 47*  --  40* 39*  CREATININE 0.72   < >  --   --  0.74  --  0.67  --  0.66 0.57  --  0.62 0.59  CALCIUM 8.3*   < >  --   --  8.4*  --  8.4*  --  8.7* 8.4*  --  7.9* 8.2*  MG 2.4  --   --  2.5* 2.6*  --   --   --   --  2.3  --  2.0  --   PHOS 5.3*  --   --  4.1 3.4  --   --   --   --  2.6  --  3.1  --    < > = values in this interval not displayed.   GFR: Estimated Creatinine Clearance: 66.2 mL/min (by C-G formula based on SCr of 0.59 mg/dL). Recent Labs  Lab 05/17/19 0514 05/18/19 0416 05/19/19 0500 05/20/19 0350  WBC 17.4* 16.0* 15.8* 16.6*    Liver Function Tests: No results for input(s): AST, ALT, ALKPHOS, BILITOT, PROT, ALBUMIN in the last 168 hours. No results for input(s): LIPASE, AMYLASE in the last 168 hours. No results for input(s): AMMONIA in the last 168 hours.  ABG    Component Value Date/Time   PHART 7.580 (H) 05/19/2019 0220   PCO2ART 40.8 05/19/2019 0220   PO2ART 45.0 (L) 05/19/2019 0220   HCO3 38.2 (H) 05/19/2019 0220   TCO2 39 (H) 05/19/2019 0220   O2SAT 87.0 05/19/2019 0220     Coagulation Profile: Recent Labs  Lab 05/16/19 2210  INR 1.1    Cardiac Enzymes: No results for input(s): CKTOTAL, CKMB, CKMBINDEX, TROPONINI in the last 168 hours.  HbA1C: Hgb A1c MFr Bld  Date/Time Value Ref Range Status  05/05/2019 12:48 PM 7.6 (H) 4.8 - 5.6 % Final    Comment:    (NOTE) Pre diabetes:          5.7%-6.4% Diabetes:              >  6.4% Glycemic control for   <7.0% adults with diabetes   10/15/2017 12:37 PM 6.5 (H) 4.8 - 5.6 % Final    Comment:    (NOTE) Pre diabetes:          5.7%-6.4% Diabetes:              >6.4% Glycemic control  for   <7.0% adults with diabetes     CBG: Recent Labs  Lab 05/19/19 1623 05/19/19 1941 05/19/19 2342 05/20/19 0301 05/20/19 0728  GLUCAP 230* 198* 143* 82 178*     Critical care time: 32 minutes    Heber Marshfield, MD [facility] Pager: [phone] Cell: [phone] If no response, call [phone]

## 2019-05-21 LAB — GLUCOSE, CAPILLARY
Glucose-Capillary: 256 mg/dL — ABNORMAL HIGH (ref 70–99)
Glucose-Capillary: 181 mg/dL — ABNORMAL HIGH (ref 70–99)
Glucose-Capillary: 264 mg/dL — ABNORMAL HIGH (ref 70–99)
Glucose-Capillary: 110 mg/dL — ABNORMAL HIGH (ref 70–99)

## 2019-05-21 MED ORDER — INSULIN ASPART 100 UNIT/ML ~~LOC~~ SOLN
12.0000 [IU] | SUBCUTANEOUS | Status: DC
  Administered 2019-05-21 – 2019-05-22 (×4): 12 [IU] via SUBCUTANEOUS

## 2019-05-21 NOTE — Progress Notes (Signed)
NAME:  Deborah Miller, MRN:  [id number], DOB:  March 11, 1956, LOS: 8 ADMISSION DATE:  06/02/2019, CONSULTATION DATE:  1/8 REFERRING MD: Belia Heman, CHIEF COMPLAINT:  dyspnea   Brief History   64 y/o female with COVID 19 pneumonia admitted on 1/1 to [facility] ICU, treated with BIPAP, high flow nasal cannula, remdesivir and ivermectin.  Developed sub cutaneous emphysema on 1/3.  developed progressive hypoxemia and dyspnea ultimately requiring intubation on 1/7, transferred to [facility] on 1/8. COVID infection initially diagnosed 12/23  Past Medical History  OSA HTN Morbid obesity GERD DM2 Cor pulmonale Asthma Thyroid nodule  Significant Hospital Events   1/1 admit [facility], bipap 1/3 sub q emphysema 1/7 intubated 1/12 tolerates pressure support weaning 1/13 weaned 13 hours, sleepy  Consults:  PCCM  Procedures:  ETT 1/7 >  R IJ CVL  1/7 >  Left PICC 1/15 >>   Significant Diagnostic Tests:  1/2 LE Doppler > DVT left peroneal vein  Micro Data:  12/23 sars cov 2 > pos 1/8 >> nml flora  Antimicrobials:  12/31 Remdesivir > 1/4 1/4 doxycycline> 1/8 1/5 Ivermectin > 1/6  Interim history/subjective:  Tolerating some PSV FiO2 0.50, PEEP 5 Not currently on continuous sedation   Objective   Blood pressure (!) 143/87, pulse (!) 105, temperature 98.8 F (37.1 C), temperature source Oral, resp. rate (!) 34, height 4\' 7"  (1.397 m), weight 95.9 kg, SpO2 97 %.    Vent Mode: PRVC FiO2 (%):  [50 %] 50 % Set Rate:  [25 bmp] 25 bmp Vt Set:  [350 mL] 350 mL PEEP:  [5 cmH20] 5 cmH20 Pressure Support:  [10 cmH20] 10 cmH20 Plateau Pressure:  [27 cmH20-30 cmH20] 30 cmH20   Intake/Output Summary (Last 24 hours) at 05/21/2019 0842 Last data filed at 05/21/2019 0600 Gross per 24 hour  Intake 1562.51 ml  Output 2175 ml  Net -612.49 ml   Filed Weights   05/19/19 0400 05/20/19 0349 05/21/19 0401  Weight: 99.6 kg 96.4 kg 95.9 kg    Examination:  General: Acute and chronically  ill-appearing woman, short stature, intubated HENT: ET tube in place, oropharynx otherwise clear, large neck PULM: Distant, no crackles or wheezes CV: Regular, borderline tachycardic, no murmur, trace edema GI: Obese, nondistended, positive bowel sounds MSK: No deformities Neuro: Wakes easily, nods to questions, follows commands.  Strong cough    Resolved Hospital Problem list     Assessment & Plan:  ARDS due to COVID 19 pneumonia > improving oxygenation and ventilator mechanics Working towards extubation, need to further wean FiO2, currently 0.60.  Has tolerated some PSV Minimize sedation Volume removal as she can tolerate, currently +4.2 L.  On Lasix 40 mg every 12h  Hypokalemia Replace as indicated, stable 1/16  Asthma exacerbation DuoNeb as needed Scheduled Brovana/Pulmicort nebs  Not currently on systemic steroids  Severe vent dyssynchrony> improved Toxic metabolic encephalopathy RASS target 0 to -1 Continue Seroquel as ordered  LLE DVT Enoxaparin therapeutic dose  DM2 Sliding-scale insulin Tube feeding coverage 8 units every 4 hours Levemir 20 units twice daily  Best practice:  Diet: continue tube feeding Pain/Anxiety/Delirium protocol (if indicated): Intermittent sedation, RASS goal -1 VAP protocol (if indicated): yes DVT prophylaxis: lovenox full dose GI prophylaxis: Pantoprazole for stress ulcer prophylaxis Glucose control: SSI Mobility: bed rest Code Status: full Family Communication: I spoke with pt's daughter 05/23/19, updated her in full 1/16 Disposition: remain in ICU  Labs   CBC: Recent Labs  Lab 05/16/19 0400 05/16/19 0400 05/17/19  8416 05/18/19 0416 05/19/19 0220 05/19/19 0500 05/20/19 0350  WBC 14.3*  --  17.4* 16.0*  --  15.8* 16.6*  NEUTROABS  --   --   --   --   --   --  13.9*  HGB 10.4*   < > 10.0* 9.2* 8.8* 9.4* 9.3*  HCT 34.5*   < > 32.4* 30.3* 26.0* 29.6* 30.4*  MCV 85.0  --  83.3 84.2  --  83.1 84.2  PLT 177  --  240 197  --   167 190   < > = values in this interval not displayed.    Basic Metabolic Panel: Recent Labs  Lab 05/14/19 1544 05/14/19 1708 05/15/19 0319 05/15/19 1703 05/16/19 0400 05/16/19 0400 05/17/19 0514 05/18/19 0416 05/19/19 0220 05/19/19 0500 05/20/19 0350  NA   < >  --  137   < > 139   < > 139 139 140 140 143  K   < >  --  4.8   < > 5.3*   < > 5.2* 5.3* 4.5 4.5 3.7  CL   < >  --  97*  --  98  --  96* 97*  --  98 96*  CO2   < >  --  32  --  33*  --  33* 33*  --  33* 37*  GLUCOSE   < >  --  261*  --  301*  --  204* 229*  --  235* 87  BUN   < >  --  38*  --  46*  --  56* 47*  --  40* 39*  CREATININE   < >  --  0.74  --  0.67  --  0.66 0.57  --  0.62 0.59  CALCIUM   < >  --  8.4*  --  8.4*  --  8.7* 8.4*  --  7.9* 8.2*  MG  --  2.5* 2.6*  --   --   --   --  2.3  --  2.0  --   PHOS  --  4.1 3.4  --   --   --   --  2.6  --  3.1  --    < > = values in this interval not displayed.   GFR: Estimated Creatinine Clearance: 65.9 mL/min (by C-G formula based on SCr of 0.59 mg/dL). Recent Labs  Lab 05/17/19 0514 05/18/19 0416 05/19/19 0500 05/20/19 0350  WBC 17.4* 16.0* 15.8* 16.6*    Liver Function Tests: No results for input(s): AST, ALT, ALKPHOS, BILITOT, PROT, ALBUMIN in the last 168 hours. No results for input(s): LIPASE, AMYLASE in the last 168 hours. No results for input(s): AMMONIA in the last 168 hours.  ABG    Component Value Date/Time   PHART 7.580 (H) 05/19/2019 0220   PCO2ART 40.8 05/19/2019 0220   PO2ART 45.0 (L) 05/19/2019 0220   HCO3 38.2 (H) 05/19/2019 0220   TCO2 39 (H) 05/19/2019 0220   O2SAT 87.0 05/19/2019 0220     Coagulation Profile: Recent Labs  Lab 05/16/19 2210  INR 1.1    Cardiac Enzymes: No results for input(s): CKTOTAL, CKMB, CKMBINDEX, TROPONINI in the last 168 hours.  HbA1C: Hgb A1c MFr Bld  Date/Time Value Ref Range Status  05/05/2019 12:48 PM 7.6 (H) 4.8 - 5.6 % Final    Comment:    (NOTE) Pre diabetes:           5.7%-6.4% Diabetes:              >  6.4% Glycemic control for   <7.0% adults with diabetes   10/15/2017 12:37 PM 6.5 (H) 4.8 - 5.6 % Final    Comment:    (NOTE) Pre diabetes:          5.7%-6.4% Diabetes:              >6.4% Glycemic control for   <7.0% adults with diabetes     CBG: Recent Labs  Lab 05/20/19 1534 05/20/19 2020 05/20/19 2328 05/21/19 0406 05/21/19 0759  GLUCAP 294* 267* 295* 256* 181*     Critical care time: 34 minutes     Baltazar Apo, MD, PhD 05/21/2019, 8:42 AM [facility] [phone] or if no answer [phone]

## 2019-05-22 ENCOUNTER — Inpatient Hospital Stay (HOSPITAL_COMMUNITY): Payer: BC Managed Care – PPO

## 2019-05-22 LAB — GLUCOSE, CAPILLARY
Glucose-Capillary: 102 mg/dL — ABNORMAL HIGH (ref 70–99)
Glucose-Capillary: 114 mg/dL — ABNORMAL HIGH (ref 70–99)
Glucose-Capillary: 130 mg/dL — ABNORMAL HIGH (ref 70–99)
Glucose-Capillary: 143 mg/dL — ABNORMAL HIGH (ref 70–99)
Glucose-Capillary: 169 mg/dL — ABNORMAL HIGH (ref 70–99)
Glucose-Capillary: 175 mg/dL — ABNORMAL HIGH (ref 70–99)
Glucose-Capillary: 49 mg/dL — ABNORMAL LOW (ref 70–99)
Glucose-Capillary: 57 mg/dL — ABNORMAL LOW (ref 70–99)
Glucose-Capillary: 91 mg/dL (ref 70–99)

## 2019-05-22 LAB — CBC
HCT: 26.2 % — ABNORMAL LOW (ref 36.0–46.0)
Hemoglobin: 7.9 g/dL — ABNORMAL LOW (ref 12.0–15.0)
MCH: 26 pg (ref 26.0–34.0)
MCHC: 30.2 g/dL (ref 30.0–36.0)
MCV: 86.2 fL (ref 80.0–100.0)
Platelets: 171 10*3/uL (ref 150–400)
RBC: 3.04 MIL/uL — ABNORMAL LOW (ref 3.87–5.11)
RDW: 17 % — ABNORMAL HIGH (ref 11.5–15.5)
WBC: 12 10*3/uL — ABNORMAL HIGH (ref 4.0–10.5)
nRBC: 1.6 % — ABNORMAL HIGH (ref 0.0–0.2)

## 2019-05-22 LAB — POCT I-STAT 7, (LYTES, BLD GAS, ICA,H+H)
Acid-Base Excess: 18 mmol/L — ABNORMAL HIGH (ref 0.0–2.0)
Bicarbonate: 44.1 mmol/L — ABNORMAL HIGH (ref 20.0–28.0)
Calcium, Ion: 1.05 mmol/L — ABNORMAL LOW (ref 1.15–1.40)
HCT: 33 % — ABNORMAL LOW (ref 36.0–46.0)
Hemoglobin: 11.2 g/dL — ABNORMAL LOW (ref 12.0–15.0)
O2 Saturation: 79 %
Potassium: 3.5 mmol/L (ref 3.5–5.1)
Sodium: 141 mmol/L (ref 135–145)
TCO2: 46 mmol/L — ABNORMAL HIGH (ref 22–32)
pCO2 arterial: 57.7 mmHg — ABNORMAL HIGH (ref 32.0–48.0)
pH, Arterial: 7.491 — ABNORMAL HIGH (ref 7.350–7.450)
pO2, Arterial: 41 mmHg — ABNORMAL LOW (ref 83.0–108.0)

## 2019-05-22 LAB — COMPREHENSIVE METABOLIC PANEL
ALT: 43 U/L (ref 0–44)
AST: 39 U/L (ref 15–41)
Albumin: 1.9 g/dL — ABNORMAL LOW (ref 3.5–5.0)
Alkaline Phosphatase: 63 U/L (ref 38–126)
Anion gap: 10 (ref 5–15)
BUN: 42 mg/dL — ABNORMAL HIGH (ref 8–23)
CO2: 38 mmol/L — ABNORMAL HIGH (ref 22–32)
Calcium: 7.6 mg/dL — ABNORMAL LOW (ref 8.9–10.3)
Chloride: 94 mmol/L — ABNORMAL LOW (ref 98–111)
Creatinine, Ser: 0.66 mg/dL (ref 0.44–1.00)
GFR calc Af Amer: >60 mL/min (ref >60)
GFR calc non Af Amer: >60 mL/min (ref >60)
Glucose, Bld: 172 mg/dL — ABNORMAL HIGH (ref 70–99)
Potassium: 3.6 mmol/L (ref 3.5–5.1)
Sodium: 142 mmol/L (ref 135–145)
Total Bilirubin: 0.7 mg/dL (ref 0.3–1.2)
Total Protein: 5.2 g/dL — ABNORMAL LOW (ref 6.5–8.1)

## 2019-05-22 LAB — MAGNESIUM: Magnesium: 2.1 mg/dL (ref 1.7–2.4)

## 2019-05-22 MED ORDER — INSULIN DETEMIR 100 UNIT/ML ~~LOC~~ SOLN
20.0000 [IU] | Freq: Every day | SUBCUTANEOUS | Status: DC
  Administered 2019-05-24: 10:00:00 20 [IU] via SUBCUTANEOUS
  Filled 2019-05-22 (×2): qty 0.2

## 2019-05-22 MED ORDER — NOREPINEPHRINE 4 MG/250ML-% IV SOLN
0.0000 ug/min | INTRAVENOUS | Status: DC
  Administered 2019-05-22: 2 ug/min via INTRAVENOUS
  Administered 2019-05-23: 70 ug/min via INTRAVENOUS
  Administered 2019-05-23: 60 ug/min via INTRAVENOUS
  Administered 2019-05-23: 20 ug/min via INTRAVENOUS
  Administered 2019-05-23: 40 ug/min via INTRAVENOUS
  Filled 2019-05-22 (×4): qty 250

## 2019-05-22 MED ORDER — MIDAZOLAM BOLUS VIA INFUSION
1.0000 mg | INTRAVENOUS | Status: DC | PRN
  Administered 2019-05-23: 1 mg via INTRAVENOUS
  Filled 2019-05-22: qty 2

## 2019-05-22 MED ORDER — ROCURONIUM BROMIDE 10 MG/ML (PF) SYRINGE: 50.0000 mg | PREFILLED_SYRINGE | Freq: Once | INTRAVENOUS | Status: AC

## 2019-05-22 MED ORDER — SODIUM CHLORIDE 0.9 % IV BOLUS
1000.0000 mL | Freq: Once | INTRAVENOUS | Status: AC
  Administered 2019-05-22: 19:00:00 1000 mL via INTRAVENOUS

## 2019-05-22 MED ORDER — NOREPINEPHRINE 4 MG/250ML-% IV SOLN: 0.0000 ug/min | INTRAVENOUS | Status: DC

## 2019-05-22 MED ORDER — FUROSEMIDE 10 MG/ML IJ SOLN
40.0000 mg | Freq: Once | INTRAMUSCULAR | Status: AC
  Administered 2019-05-22: 16:00:00 40 mg via INTRAVENOUS
  Filled 2019-05-22: qty 4

## 2019-05-22 MED ORDER — DEXTROSE 50 % IV SOLN
12.5000 g | INTRAVENOUS | Status: AC
  Administered 2019-05-22: 12:00:00 12.5 g via INTRAVENOUS

## 2019-05-22 MED ORDER — DOCUSATE SODIUM 50 MG/5ML PO LIQD: 100.0000 mg | Freq: Two times a day (BID) | ORAL | Status: DC | PRN

## 2019-05-22 MED ORDER — HYDROMORPHONE BOLUS VIA INFUSION
0.5000 mg | INTRAVENOUS | Status: DC | PRN
  Filled 2019-05-22: qty 1

## 2019-05-22 MED ORDER — FENTANYL CITRATE (PF) 100 MCG/2ML IJ SOLN: 100.0000 ug | Freq: Once | INTRAMUSCULAR | Status: AC

## 2019-05-22 MED ORDER — MIDAZOLAM 50MG/50ML (1MG/ML) PREMIX INFUSION
0.0000 mg/h | INTRAVENOUS | Status: DC
  Administered 2019-05-22 – 2019-05-23 (×2): 1 mg/h via INTRAVENOUS
  Administered 2019-05-23: 3 mg/h via INTRAVENOUS
  Filled 2019-05-22 (×3): qty 50

## 2019-05-22 MED ORDER — DEXTROSE 50 % IV SOLN: 25.0000 g | INTRAVENOUS | Status: AC

## 2019-05-22 MED ORDER — HYDROMORPHONE HCL 1 MG/ML IJ SOLN: 1.0000 mg | Freq: Once | INTRAMUSCULAR | Status: DC

## 2019-05-22 MED ORDER — MIDAZOLAM HCL 2 MG/2ML IJ SOLN: 1.0000 mg | Freq: Once | INTRAMUSCULAR | Status: AC

## 2019-05-22 MED ORDER — FENTANYL CITRATE (PF) 100 MCG/2ML IJ SOLN
INTRAMUSCULAR | Status: AC
  Administered 2019-05-22: 100 ug via INTRAVENOUS
  Filled 2019-05-22: qty 2

## 2019-05-22 MED ORDER — PHENYLEPHRINE HCL-NACL 10-0.9 MG/250ML-% IV SOLN
0.0000 ug/min | INTRAVENOUS | Status: DC
  Administered 2019-05-22: 40 ug/min via INTRAVENOUS
  Administered 2019-05-22: 400 ug/min via INTRAVENOUS
  Filled 2019-05-22: qty 250
  Filled 2019-05-22: qty 500

## 2019-05-22 MED ORDER — DEXTROSE 50 % IV SOLN
INTRAVENOUS | Status: AC
  Filled 2019-05-22: qty 50

## 2019-05-22 MED ORDER — ROCURONIUM BROMIDE 10 MG/ML (PF) SYRINGE
PREFILLED_SYRINGE | INTRAVENOUS | Status: AC
  Administered 2019-05-22: 50 mg via INTRAVENOUS
  Filled 2019-05-22: qty 10

## 2019-05-22 MED ORDER — FUROSEMIDE 10 MG/ML IJ SOLN: 40.0000 mg | Freq: Every day | INTRAMUSCULAR | Status: DC

## 2019-05-22 MED ORDER — DEXTROSE 50 % IV SOLN
INTRAVENOUS | Status: AC
  Administered 2019-05-22: 50 mL via INTRAVENOUS
  Filled 2019-05-22: qty 50

## 2019-05-22 MED ORDER — VANCOMYCIN HCL 2000 MG/400ML IV SOLN
2000.0000 mg | Freq: Once | INTRAVENOUS | Status: AC
  Administered 2019-05-22: 2000 mg via INTRAVENOUS
  Filled 2019-05-22: qty 400

## 2019-05-22 MED ORDER — DEXTROSE 10 % IV SOLN: INTRAVENOUS | Status: DC

## 2019-05-22 MED ORDER — DEXTROSE 50 % IV SOLN
INTRAVENOUS | Status: AC
  Administered 2019-05-22: 16:00:00 12.5 g via INTRAVENOUS
  Filled 2019-05-22: qty 50

## 2019-05-22 MED ORDER — NOREPINEPHRINE 4 MG/250ML-% IV SOLN
INTRAVENOUS | Status: AC
  Administered 2019-05-22: 15 ug/min via INTRAVENOUS
  Filled 2019-05-22: qty 250

## 2019-05-22 MED ORDER — VANCOMYCIN HCL IN DEXTROSE 1-5 GM/200ML-% IV SOLN
1000.0000 mg | INTRAVENOUS | Status: DC
  Administered 2019-05-23: 1000 mg via INTRAVENOUS
  Filled 2019-05-22 (×2): qty 200

## 2019-05-22 MED ORDER — ETOMIDATE 2 MG/ML IV SOLN: 20.0000 mg | Freq: Once | INTRAVENOUS | Status: AC

## 2019-05-22 MED ORDER — FLUTICASONE FUROATE-VILANTEROL 100-25 MCG/INH IN AEPB
1.0000 | INHALATION_SPRAY | Freq: Every day | RESPIRATORY_TRACT | Status: DC
  Filled 2019-05-22: qty 28

## 2019-05-22 MED ORDER — PHENYLEPHRINE CONCENTRATED 100MG/250ML (0.4 MG/ML) INFUSION SIMPLE
0.0000 ug/min | INTRAVENOUS | Status: DC
  Administered 2019-05-23: 400 ug/min via INTRAVENOUS
  Administered 2019-05-23: 360 ug/min via INTRAVENOUS
  Administered 2019-05-23: 320 ug/min via INTRAVENOUS
  Administered 2019-05-23 (×2): 200 ug/min via INTRAVENOUS
  Administered 2019-05-23: 400 ug/min via INTRAVENOUS
  Administered 2019-05-24: 200 ug/min via INTRAVENOUS
  Filled 2019-05-22 (×7): qty 250

## 2019-05-22 MED ORDER — SODIUM CHLORIDE 0.9 % IV SOLN: INTRAVENOUS | Status: DC | PRN

## 2019-05-22 MED ORDER — MIDAZOLAM HCL 2 MG/2ML IJ SOLN
INTRAMUSCULAR | Status: AC
  Administered 2019-05-22: 19:00:00 1 mg via INTRAVENOUS
  Filled 2019-05-22: qty 4

## 2019-05-22 MED ORDER — SODIUM CHLORIDE 0.9 % IV SOLN
0.5000 mg/h | INTRAVENOUS | Status: DC
  Administered 2019-05-22: 0.5 mg/h via INTRAVENOUS
  Filled 2019-05-22: qty 5

## 2019-05-22 MED ORDER — SODIUM CHLORIDE 0.9 % IV SOLN
2.0000 g | Freq: Three times a day (TID) | INTRAVENOUS | Status: DC
  Administered 2019-05-22 – 2019-05-23 (×3): 2 g via INTRAVENOUS
  Filled 2019-05-22 (×3): qty 2

## 2019-05-22 MED ORDER — DEXTROSE 50 % IV SOLN: 12.5000 g | INTRAVENOUS | Status: AC

## 2019-05-22 MED ORDER — ETOMIDATE 2 MG/ML IV SOLN
INTRAVENOUS | Status: AC
  Administered 2019-05-22: 20 mg via INTRAVENOUS
  Filled 2019-05-22: qty 20

## 2019-05-22 MED ORDER — BISACODYL 10 MG RE SUPP
10.0000 mg | Freq: Every day | RECTAL | Status: DC | PRN
  Filled 2019-05-22: qty 1

## 2019-05-22 NOTE — Progress Notes (Signed)
ANTICOAGULATION CONSULT NOTE  Pharmacy Consult for Enoxaparin Indication: DVT  Patient Measurements: Height: 4\' 7"  (139.7 cm) Weight: 210 lb 8.6 oz (95.5 kg) IBW/kg (Calculated) : 34  Vital Signs: Temp: 99.7 F (37.6 C) (01/17 1000) Temp Source: Oral (01/17 1000) BP: 106/54 (01/17 1000) Pulse Rate: 93 (01/17 1000)  Labs: Recent Labs    05/20/19 0350 05/22/19 0425  HGB 9.3* 7.9*  HCT 30.4* 26.2*  PLT 190 171  CREATININE 0.59 0.66    Estimated Creatinine Clearance: 65.7 mL/min (by C-G formula based on SCr of 0.66 mg/dL).   Assessment: Patient is a 64yo female admitted for Covid, with new DVT. Venous ultrasound revealed an occlusive DVT involving the left peroneal vein. Of note patient was transitioned from Lovenox (1/2-1/7) to apixaban (1/7-1/8) at [facility] but now back to Lovenox.   SCr remains low/stable.  Hgb slowly decreased to 9.4 (baseline 12.1) and today is decreased to 7.9.  Plt remain WNL. RN previously documented frank red rectal bleeding related to hemorrhoids.   RN [facility] today reports significant brusing to her arms and her abdomen, minor oral bleeding d/t ulcerations of the tongue.   Plan:   Continue Enoxaparin 1mg /kg (100mg ) SQ q12h.  CBC and Bmet q72h  Follow up plans for eventual transition to Eliquis.  Hulan Amato PharmD, BCPS Clinical pharmacist phone 7am- 5pm: [phone] 05/22/2019 11:04 AM

## 2019-05-22 NOTE — Progress Notes (Signed)
Patient saturating @85 % on HHFNC 40L 100%FiO2.  She has a weak cough.  She is waking up and sustaining alertness for more prolonged periods of time.  She has a croupy cough and she has course lung sounds in the upper lung fields.  Contacted Dr. and received verbal for 40mg  Lasix IV push and Incentive spirometer therapy.

## 2019-05-22 NOTE — Procedures (Signed)
Intubation Procedure Note Deborah Miller [id number] 17-Sep-1955  Procedure: Intubation Indications: Respiratory insufficiency  Procedure Details Consent: Risks of procedure as well as the alternatives and risks of each were explained to the (patient/caregiver).  Consent for procedure obtained. Time Out: Verified patient identification, verified procedure, site/side was marked, verified correct patient position, special equipment/implants available, medications/allergies/relevent history reviewed, required imaging and test results available.  Performed  Maximum sterile technique was used including antiseptics, cap, gloves, gown, hand hygiene and mask.  MAC and 4  Versed 1 fent 100 mcg' Etomidate 20 Rocuronium 50   Evaluation Hemodynamic Status: Transient hypotension treated with pressors and treated with pressors and fluid; O2 sats: transiently fell during during procedure and currently acceptable Patient's Current Condition: stable Complications: No apparent complications Patient did tolerate procedure well. Chest X-ray ordered to verify placement.  CXR: pending.   Leanna Sato Elsworth Soho 05/22/2019

## 2019-05-22 NOTE — Progress Notes (Signed)
MD hand delivered critical ABG results and is at bedside to see patient

## 2019-05-22 NOTE — Progress Notes (Signed)
Patient remains on 0.53mcg precedex.  She responds to voice and upon awake assessment she moved her toes and feet to command; she did Not move her hands or grip upon voice command.  Her vitals WDL. Her peripheral pulses are strong.  She has 2+ pitting edema to the bilateral upper/lower extremities.  Her skin is jaundice.  She has bilateral upper extremity bruising arms and abdomen.  100mg  lovenox subq is prescribed. She has white ulcerations on her tongue.  The mucosa is moist but with a light pink color.  Performed mucosal and mouth cleansing twice in the 0800 hour.  Turned patient to her right side.  Delivered 40mg  lasix IV push.  Assessing for UOP via purewick.

## 2019-05-22 NOTE — Progress Notes (Signed)
Dr. Vassie Loll plans for extubation of patient after successful wake assessment and improved respiratory functions during rounds this AM.  Patient Precedex has been turned off to help facilitate acuity for patient with tube removal and breathing training.

## 2019-05-22 NOTE — Progress Notes (Signed)
Placed pt on heated HFNC due to increased WOB/resp distress. Started pt on 35L/100%, increased to 40L/100%. RN at bedside

## 2019-05-22 NOTE — Progress Notes (Signed)
Patient has become lethargic and confused.  She does not respond appropriately to questions about pain and comfort and continues to sound that she desires a feeding tube.  Notified physician about patient status;  ABG ordered.  Called patients Daughter Achilles Dunk and explained situation.  Patient daughter stated she wants to have her mother intubated if it means it can help save her life.  Patients B/P has now started to decline systolic 90's.  Notified physician and RT of possible need for intubation.  Physician and RT coming to bedside.

## 2019-05-22 NOTE — Progress Notes (Signed)
BLISTERS TO COCCYX AND BUTTOCKS HAVE OPENED, BASE RED IN COLOR, IN COCCYX AREA MIDDLE AREA HAS DARKENED COLOR PURPLE BUT BLANCABLE.

## 2019-05-22 NOTE — Progress Notes (Addendum)
PM rounds  Post extubation, requires 40 L 100% to maintain saturations more than 85% Blood work of breathing acceptable Diuresis with another 40 of Lasix Hypoglycemic-discontinue p.m. dose of Levemir   Called to bedside for worsening hypoxia, on heated high flow and nonrebreather still saturations only around 80% Daughter clarified that reintubation was okay.  Not convinced that patient can participate in decision-making at this time since she is hypoxic and not fully able to understand consequences She was reintubated, sustained prolonged hypoxia for 15 minutes ET tube readjusted after chest x-ray to 22 cm PEEP gradually increased to 16 cm with improvement in saturations to more than 85% eventually  Sedation and vent orders written. Chest x-ray shows right lower lobe infiltrate, will obtain respiratory culture and empirically add antibiotics for aspiration pneumonia  Discussed with daughter, limited CODE BLUE status issued, no CPR no cardioversion, pressors and ventilation okay We will need to readdress goals of care depending on how she does  Additional critical care time independent of procedure x 45 minutes   Dariann Huckaba V. Vassie Loll MD

## 2019-05-22 NOTE — Progress Notes (Signed)
Dr. Vassie Loll arrived at bedside and assessed patient.  Intubation ordered with 50mg  Roc, Fentanyl, 20mg  etomidate, 1mg  versed. ETT placed and verified with auscultation and chest xray.  O/G tube placement verified with abd xray.  ETT 23cm 7.5 french.  Patient 100% FiO2 pressure controlled.  Bolus NS.  Started Levo and then stopped d/t pressure normalizing. 108/51.  Saturation 91%.  Patient stable.  Family informed and consenting to medical tx.

## 2019-05-22 NOTE — Progress Notes (Signed)
Patient FSBS @ 1200 was 57.  Activated hypoglycemic protocols and delivered 12.5gm Dextrose IV push.  Patient recheck was 130 fsbs @ 1217.  Patient is stable.

## 2019-05-22 NOTE — Progress Notes (Signed)
eLink Physician-Brief Progress Note Patient Name: Deborah Miller DOB: 1955-10-10 MRN: [id number]   Date of Service  05/22/2019  HPI/Events of Note  Hypotension - BP = 101/72 with MAP = 83 on Norepinephrine IV infusion @ 25 mcg/min. HR = 125 Sinus Tachycardia.   eICU Interventions  Will order: 1. Monitor CVP now and Q 4 hours.  2. Phenylephrine IV infusion. Titrate to MAP >= 65. 3. Wean Norepinephrine IV infusion off as tolerated.      Intervention Category Major Interventions: Hypotension - evaluation and management  Lenell Antu 05/22/2019, 10:17 PM

## 2019-05-22 NOTE — Progress Notes (Signed)
Pharmacy Antibiotic Note  Deborah Miller is a 64 y.o. female admitted on 05/27/2019 with COVID-19 pneumonia.  Pharmacy has been consulted for vancomycin and cefepime dosing for HAP after developing worsening hypoxia requiring intubation 05/22/19. CXR shows RLL infiltrate, plan respiratory cultures.  Plan:  Vancomycin 2g IV x 1, then 1g IV q24h for estimated AUC 519 (goal 400-550)  Consider checking levels at steady state if necessary  Cefepime 2g IV q8h  Follow up renal function & cultures, clinical progress  Height: 4\' 7"  (139.7 cm) Weight: 210 lb 8.6 oz (95.5 kg) IBW/kg (Calculated) : 34  Temp (24hrs), Avg:100.3 F (37.9 C), Min:98.9 F (37.2 C), Max:102.2 F (39 C)  Recent Labs  Lab 05/17/19 0514 05/18/19 0416 05/19/19 0500 05/20/19 0350 05/22/19 0425  WBC 17.4* 16.0* 15.8* 16.6* 12.0*  CREATININE 0.66 0.57 0.62 0.59 0.66    Estimated Creatinine Clearance: 65.7 mL/min (by C-G formula based on SCr of 0.66 mg/dL).    Allergies  Allergen Reactions  . Citrus     Itchy throat   . Pork-Derived Products     Itchy throat   . Augmentin [Amoxicillin-Pot Clavulanate] Nausea And Vomiting    Has patient had a PCN reaction causing immediate rash, facial/tongue/throat swelling, SOB or lightheadedness with hypotension: No Has patient had a PCN reaction causing severe rash involving mucus membranes or skin necrosis: No Has patient had a PCN reaction that required hospitalization: No Has patient had a PCN reaction occurring within the last 10 years: Yes If all of the above answers are "NO", then may proceed with Cephalosporin use.   . Latex Other (See Comments)    Blisters NOTE: Latex IgE NEGATIVE (<0.10) Around mouth causes a rash  . Morphine And Related Nausea And Vomiting    Patient does not want morphine at all.  . Cefadroxil Other (See Comments)    Severe cramping in side. Felt like ribs were going to explode  . Other Other (See Comments)    Lettuce causes her to start  with diverticulitis  . Pollen Extract Other (See Comments)    Trees, grass, mold, dust causes raspy voice, nasal irritation  . Tape Other (See Comments)    Blisters. Paper tape is acceptable    Antimicrobials this admission: 12/31 remdesivir>>12/4 1/5 doxycycline>>1/8 1/4 & 1/5 > ivermectin  1/13 Ceftriaxone >> 1/16 1/17 Vancomycin >> 1/17 Cefepime >>  Dose adjustments this admission:  Microbiology results: Hep B neg 12/31 blood>>negF 1/8 TA: normal flora 1/13 UCx: NGF  Thank you for allowing pharmacy to be a part of this patient's care.  2/13, PharmD, BCPS Pharmacy: [phone] 05/22/2019 7:12 PM

## 2019-05-22 NOTE — Progress Notes (Signed)
Sputum Culture sent to lab.  

## 2019-05-22 NOTE — Progress Notes (Addendum)
NAME:  Deborah Miller, MRN:  [id number], DOB:  July 06, 1955, LOS: 9 ADMISSION DATE:  05/12/2019, CONSULTATION DATE:  1/8 REFERRING MD: Belia Heman, CHIEF COMPLAINT:  dyspnea   Brief History   64 y/o female with COVID 19 pneumonia admitted on 1/1 to [facility] ICU, treated with BIPAP, high flow nasal cannula, remdesivir and ivermectin.  Developed sub cutaneous emphysema on 1/3.  developed progressive hypoxemia and dyspnea ultimately requiring intubation on 1/7, transferred to [facility] on 1/8. COVID infection initially diagnosed 12/23  Past Medical History  OSA HTN Morbid obesity GERD DM2 Cor pulmonale Asthma Thyroid nodule  Significant Hospital Events   1/1 admit [facility], bipap 1/3 sub q emphysema 1/7 intubated 1/12 tolerates pressure support weaning 1/13 weaned 13 hours, sleepy  Consults:  PCCM  Procedures:  ETT 1/7 >  R IJ CVL  1/7 > 1/15 Left PICC 1/15 >>   Significant Diagnostic Tests:  1/2 LE Doppler > DVT left peroneal vein  Micro Data:  12/23 sars cov 2 > pos 1/8resp >> nml flora 1/13 urine >> ng  Antimicrobials:  12/31 Remdesivir > 1/4 1/4 doxycycline> 1/8 1/5 Ivermectin > 1/6  Interim history/subjective:   Febrile. Remains critically ill, intubated Good urine output with Lasix    Objective   Blood pressure (!) 114/57, pulse 86, temperature (!) 100.5 F (38.1 C), temperature source Oral, resp. rate (!) 29, height 4\' 7"  (1.397 m), weight 95.5 kg, SpO2 94 %.    Vent Mode: PRVC FiO2 (%):  [50 %] 50 % Set Rate:  [25 bmp] 25 bmp Vt Set:  [350 mL] 350 mL PEEP:  [5 cmH20] 5 cmH20 Pressure Support:  [12 cmH20] 12 cmH20 Plateau Pressure:  [15 cmH20-27 cmH20] 27 cmH20   Intake/Output Summary (Last 24 hours) at 05/22/2019 1011 Last data filed at 05/22/2019 0800 Gross per 24 hour  Intake 1197.61 ml  Output 1855 ml  Net -657.39 ml   Filed Weights   05/20/19 0349 05/21/19 0401 05/22/19 0500  Weight: 96.4 kg 95.9 kg 95.5 kg    Examination:  General: Acute  and chronically ill-appearing obese woman, short stature, intubated HENT: ET tube in place, mild pallor, no icterus, large neck PULM: Decreased breath sounds bilateral,, no crackles or wheezes CV: Regular, borderline tachycardic, no murmur, trace edema GI: Obese, nondistended, positive bowel sounds MSK: No deformities Neuro: Wakes easily, nods to questions, follows commands.  Weak extremities.  Chest x-ray 1/15 personally reviewed which shows stable bilateral patchy hazy opacities  Labs show normal electrolytes, stable renal function, low albumin 1.9, decreasing leukocytosis and stable anemia  Resolved Hospital Problem list     Assessment & Plan:  ARDS due to COVID 19 pneumonia > improving oxygenation and ventilator mechanics  -Tolerates pressure support weaning. Proceed with extubation, nocturnal CPAP 10 cm, she likely has underlying OSA -Continue Lasix 40 mg daily   Asthma exacerbation DuoNeb as needed Scheduled Brovana/Pulmicort nebs -post extubation switch to Breo No need for steroids currently  Severe vent dyssynchrony> improved Toxic metabolic encephalopathy RASS target 0 to -1 dc Seroquel and all other sedation  LLE DVT Enoxaparin therapeutic dose -Switch to apixaban eventually  DM2 Sliding-scale insulin Levemir 20 units twice daily DC tube feed coverage once extubated  Fever 1/16 -decreasing leukocytosis, no evidence of new infection, central line has been removed -Monitor closely, panculture if recurs  Summary-has made good progress, trial of extubation today  Best practice:  Diet: continue tube feeding Pain/Anxiety/Delirium protocol (if indicated): Coming off sedation VAP protocol (if  indicated): yes DVT prophylaxis: lovenox full dose GI prophylaxis: Pantoprazole  Glucose control: SSI, Levemir Mobility: bed rest Code Status: full Family Communication:  daughter Alyse Low 1/16 Disposition: ICU   The patient is critically ill with multiple organ  systems failure and requires high complexity decision making for assessment and support, frequent evaluation and titration of therapies, application of advanced monitoring technologies and extensive interpretation of multiple databases. Critical Care Time devoted to patient care services described in this note independent of APP/resident  time is 35 minutes.    Kara Mead MD. [place] Flood. [facility]  If no response to pager , please call 319 [phone]   05/22/2019

## 2019-05-23 ENCOUNTER — Inpatient Hospital Stay (HOSPITAL_COMMUNITY): Payer: BC Managed Care – PPO

## 2019-05-23 LAB — GLUCOSE, CAPILLARY
Glucose-Capillary: 101 mg/dL — ABNORMAL HIGH (ref 70–99)
Glucose-Capillary: 106 mg/dL — ABNORMAL HIGH (ref 70–99)
Glucose-Capillary: 116 mg/dL — ABNORMAL HIGH (ref 70–99)
Glucose-Capillary: 120 mg/dL — ABNORMAL HIGH (ref 70–99)
Glucose-Capillary: 125 mg/dL — ABNORMAL HIGH (ref 70–99)
Glucose-Capillary: 33 mg/dL — CL (ref 70–99)
Glucose-Capillary: 53 mg/dL — ABNORMAL LOW (ref 70–99)
Glucose-Capillary: 90 mg/dL (ref 70–99)
Glucose-Capillary: 90 mg/dL (ref 70–99)
Glucose-Capillary: 96 mg/dL (ref 70–99)

## 2019-05-23 LAB — POCT I-STAT 7, (LYTES, BLD GAS, ICA,H+H)
Acid-Base Excess: 3 mmol/L — ABNORMAL HIGH (ref 0.0–2.0)
Acid-base deficit: 6 mmol/L — ABNORMAL HIGH (ref 0.0–2.0)
Bicarbonate: 26.6 mmol/L (ref 20.0–28.0)
Bicarbonate: 19.2 mmol/L — ABNORMAL LOW (ref 20.0–28.0)
Calcium, Ion: 0.9 mmol/L — ABNORMAL LOW (ref 1.15–1.40)
Calcium, Ion: 1.15 mmol/L (ref 1.15–1.40)
HCT: 27 % — ABNORMAL LOW (ref 36.0–46.0)
HCT: 21 % — ABNORMAL LOW (ref 36.0–46.0)
Hemoglobin: 9.2 g/dL — ABNORMAL LOW (ref 12.0–15.0)
Hemoglobin: 7.1 g/dL — ABNORMAL LOW (ref 12.0–15.0)
O2 Saturation: 96 %
O2 Saturation: 93 %
Patient temperature: 101.5
Patient temperature: 103.1
Potassium: 4.9 mmol/L (ref 3.5–5.1)
Potassium: 4.9 mmol/L (ref 3.5–5.1)
Sodium: 141 mmol/L (ref 135–145)
Sodium: 142 mmol/L (ref 135–145)
TCO2: 28 mmol/L (ref 22–32)
TCO2: 20 mmol/L — ABNORMAL LOW (ref 22–32)
pCO2 arterial: 39.1 mmHg (ref 32.0–48.0)
pCO2 arterial: 38.6 mmHg (ref 32.0–48.0)
pH, Arterial: 7.447 (ref 7.350–7.450)
pH, Arterial: 7.317 — ABNORMAL LOW (ref 7.350–7.450)
pO2, Arterial: 85 mmHg (ref 83.0–108.0)
pO2, Arterial: 83 mmHg (ref 83.0–108.0)

## 2019-05-23 LAB — COMPREHENSIVE METABOLIC PANEL
ALT: 2335 U/L — ABNORMAL HIGH (ref 0–44)
AST: 2436 U/L — ABNORMAL HIGH (ref 15–41)
Albumin: 1.4 g/dL — ABNORMAL LOW (ref 3.5–5.0)
Alkaline Phosphatase: 70 U/L (ref 38–126)
Anion gap: 18 — ABNORMAL HIGH (ref 5–15)
BUN: 47 mg/dL — ABNORMAL HIGH (ref 8–23)
CO2: 16 mmol/L — ABNORMAL LOW (ref 22–32)
Calcium: 5.2 mg/dL — CL (ref 8.9–10.3)
Chloride: 111 mmol/L (ref 98–111)
Creatinine, Ser: 1.27 mg/dL — ABNORMAL HIGH (ref 0.44–1.00)
GFR calc Af Amer: 52 mL/min — ABNORMAL LOW (ref >60)
GFR calc non Af Amer: 45 mL/min — ABNORMAL LOW (ref >60)
Glucose, Bld: 57 mg/dL — ABNORMAL LOW (ref 70–99)
Potassium: 5 mmol/L (ref 3.5–5.1)
Sodium: 145 mmol/L (ref 135–145)
Total Bilirubin: 1.2 mg/dL (ref 0.3–1.2)
Total Protein: 4.4 g/dL — ABNORMAL LOW (ref 6.5–8.1)

## 2019-05-23 LAB — BASIC METABOLIC PANEL
Anion gap: 12 (ref 5–15)
BUN: 42 mg/dL — ABNORMAL HIGH (ref 8–23)
CO2: 27 mmol/L (ref 22–32)
Calcium: 6 mg/dL — CL (ref 8.9–10.3)
Chloride: 106 mmol/L (ref 98–111)
Creatinine, Ser: 0.94 mg/dL (ref 0.44–1.00)
GFR calc Af Amer: >60 mL/min (ref >60)
GFR calc non Af Amer: >60 mL/min (ref >60)
Glucose, Bld: 86 mg/dL (ref 70–99)
Potassium: 3.2 mmol/L — ABNORMAL LOW (ref 3.5–5.1)
Sodium: 145 mmol/L (ref 135–145)

## 2019-05-23 LAB — ABO/RH: ABO/RH(D): O POS

## 2019-05-23 LAB — CBC
HCT: 30.2 % — ABNORMAL LOW (ref 36.0–46.0)
Hemoglobin: 8.8 g/dL — ABNORMAL LOW (ref 12.0–15.0)
MCH: 26 pg (ref 26.0–34.0)
MCHC: 29.1 g/dL — ABNORMAL LOW (ref 30.0–36.0)
MCV: 89.1 fL (ref 80.0–100.0)
Platelets: 193 10*3/uL (ref 150–400)
RBC: 3.39 MIL/uL — ABNORMAL LOW (ref 3.87–5.11)
RDW: 17.7 % — ABNORMAL HIGH (ref 11.5–15.5)
WBC: 10.9 10*3/uL — ABNORMAL HIGH (ref 4.0–10.5)
nRBC: 10.4 % — ABNORMAL HIGH (ref 0.0–0.2)

## 2019-05-23 LAB — PREPARE RBC (CROSSMATCH)

## 2019-05-23 MED ORDER — SODIUM CHLORIDE 0.9 % IV BOLUS
1000.0000 mL | Freq: Once | INTRAVENOUS | Status: AC
  Administered 2019-05-23: 1000 mL via INTRAVENOUS

## 2019-05-23 MED ORDER — CALCIUM GLUCONATE 10 % IV SOLN
2.0000 g | Freq: Once | INTRAVENOUS | Status: DC
  Filled 2019-05-23: qty 20

## 2019-05-23 MED ORDER — SODIUM CHLORIDE 0.9 % IV SOLN
2.0000 g | Freq: Two times a day (BID) | INTRAVENOUS | Status: DC
  Administered 2019-05-23 – 2019-05-24 (×2): 2 g via INTRAVENOUS
  Filled 2019-05-23 (×2): qty 2

## 2019-05-23 MED ORDER — SODIUM CHLORIDE 0.9 % IV BOLUS
1000.0000 mL | Freq: Once | INTRAVENOUS | Status: AC
  Administered 2019-05-23: 10:00:00 1000 mL via INTRAVENOUS

## 2019-05-23 MED ORDER — CALCIUM GLUCONATE-NACL 1-0.675 GM/50ML-% IV SOLN
INTRAVENOUS | Status: AC
  Filled 2019-05-23: qty 50

## 2019-05-23 MED ORDER — ARFORMOTEROL TARTRATE 15 MCG/2ML IN NEBU
15.0000 ug | INHALATION_SOLUTION | Freq: Two times a day (BID) | RESPIRATORY_TRACT | Status: DC
  Administered 2019-05-23 (×2): 15 ug via RESPIRATORY_TRACT
  Filled 2019-05-23 (×5): qty 2

## 2019-05-23 MED ORDER — SODIUM CHLORIDE 0.9% IV SOLUTION: Freq: Once | INTRAVENOUS | Status: DC

## 2019-05-23 MED ORDER — BUDESONIDE 0.5 MG/2ML IN SUSP
0.5000 mg | Freq: Two times a day (BID) | RESPIRATORY_TRACT | Status: DC
  Administered 2019-05-23 (×2): 0.5 mg via RESPIRATORY_TRACT
  Filled 2019-05-23 (×2): qty 2

## 2019-05-23 MED ORDER — DEXTROSE 50 % IV SOLN
1.0000 | Freq: Once | INTRAVENOUS | Status: AC
  Administered 2019-05-23: 50 mL via INTRAVENOUS
  Filled 2019-05-23: qty 50

## 2019-05-23 MED ORDER — NOREPINEPHRINE 16 MG/250ML-% IV SOLN
0.0000 ug/min | INTRAVENOUS | Status: DC
  Administered 2019-05-23 (×5): 70 ug/min via INTRAVENOUS
  Administered 2019-05-24: 66.987 ug/min via INTRAVENOUS
  Administered 2019-05-24: 64 ug/min via INTRAVENOUS
  Filled 2019-05-23 (×8): qty 250

## 2019-05-23 MED ORDER — VASOPRESSIN 20 UNIT/ML IV SOLN
0.0100 [IU]/min | INTRAVENOUS | Status: DC
  Administered 2019-05-23: 15:00:00 0.03 [IU]/min via INTRAVENOUS
  Administered 2019-05-24: 07:00:00 0.04 [IU]/min via INTRAVENOUS
  Filled 2019-05-23 (×2): qty 2

## 2019-05-23 MED ORDER — CALCIUM GLUCONATE 10 % IV SOLN: 2.0000 g | Freq: Once | INTRAVENOUS | Status: DC

## 2019-05-23 MED ORDER — CALCIUM GLUCONATE-NACL 2-0.675 GM/100ML-% IV SOLN: 2.0000 g | Freq: Once | INTRAVENOUS | Status: DC

## 2019-05-23 MED ORDER — CALCIUM CHLORIDE 10 % IV SOLN
1.0000 g | Freq: Once | INTRAVENOUS | Status: AC
  Administered 2019-05-23: 1 g via INTRAVENOUS

## 2019-05-23 MED ORDER — DEXTROSE 50 % IV SOLN: 50.0000 mL | Freq: Once | INTRAVENOUS | Status: DC

## 2019-05-23 MED ORDER — CALCIUM CHLORIDE 10 % IV SOLN
1.0000 g | Freq: Once | INTRAVENOUS | Status: AC
  Administered 2019-05-23: 09:00:00 1 g via INTRAVENOUS

## 2019-05-23 NOTE — Procedures (Signed)
Arterial Catheter Insertion Procedure Note KENASIA SCHELLER [id number] 1955-10-03  Procedure: Insertion of Arterial Catheter  Indications: Blood pressure monitoring  Procedure Details Consent: Unable to obtain consent because of emergent medical necessity. Time Out: Verified patient identification, verified procedure, site/side was marked, verified correct patient position, special equipment/implants available, medications/allergies/relevent history reviewed, required imaging and test results available.  Performed  Maximum sterile technique was used including antiseptics, cap, gloves, gown, hand hygiene, mask and sheet. Skin prep: Chlorhexidine; local anesthetic administered 20 gauge catheter was inserted into left femoral artery using the Seldinger technique. ULTRASOUND GUIDANCE USED: YES Evaluation Blood flow good; BP tracing good. Complications: No apparent complications.   Daleen Bo Rico Massar 05/23/2019

## 2019-05-23 NOTE — Progress Notes (Signed)
Pharmacy Antibiotic Note  Deborah Miller is a 64 y.o. female admitted on 05/10/2019 with COVID-19 pneumonia.  Pharmacy dosing vancomycin and cefepime for HAP after developing worsening hypoxia requiring intubation 05/22/19. CXR shows RLL infiltrate, plan respiratory cultures.  Of note, patient's renal fx has worsened today. SCr up to 1.27  Plan:  Continue vancomycin 1g IV q24h for estimated AUC 490 (goal 400-550)  Consider checking levels at steady state if necessary  Decrease Cefepime to 2g IV q12h  Follow up renal function & cultures, clinical progress  Height: 4\' 7"  (139.7 cm) Weight: 219 lb 9.3 oz (99.6 kg) IBW/kg (Calculated) : 34  Temp (24hrs), Avg:100.9 F (38.3 C), Min:98.9 F (37.2 C), Max:103.1 F (39.5 C)  Recent Labs  Lab 05/18/19 0416 05/18/19 0416 05/19/19 0500 05/20/19 0350 05/22/19 0425 05/23/19 0515 05/23/19 0900  WBC 16.0*  --  15.8* 16.6* 12.0* 10.9*  --   CREATININE 0.57   < > 0.62 0.59 0.66 0.94 1.27*   < > = values in this interval not displayed.    Estimated Creatinine Clearance: 42.5 mL/min (A) (by C-G formula based on SCr of 1.27 mg/dL (H)).    Allergies  Allergen Reactions  . Citrus     Itchy throat   . Pork-Derived Products     Itchy throat   . Augmentin [Amoxicillin-Pot Clavulanate] Nausea And Vomiting    Has patient had a PCN reaction causing immediate rash, facial/tongue/throat swelling, SOB or lightheadedness with hypotension: No Has patient had a PCN reaction causing severe rash involving mucus membranes or skin necrosis: No Has patient had a PCN reaction that required hospitalization: No Has patient had a PCN reaction occurring within the last 10 years: Yes If all of the above answers are "NO", then may proceed with Cephalosporin use.   . Latex Other (See Comments)    Blisters NOTE: Latex IgE NEGATIVE (<0.10) Around mouth causes a rash  . Morphine And Related Nausea And Vomiting    Patient does not want morphine at all.  .  Cefadroxil Other (See Comments)    Severe cramping in side. Felt like ribs were going to explode  . Other Other (See Comments)    Lettuce causes her to start with diverticulitis  . Pollen Extract Other (See Comments)    Trees, grass, mold, dust causes raspy voice, nasal irritation  . Tape Other (See Comments)    Blisters. Paper tape is acceptable    Antimicrobials this admission: 12/31 remdesivir>>12/4 1/5 doxycycline>>1/8 1/4 & 1/5 > ivermectin  1/13 Ceftriaxone >> 1/16 1/17 Vancomycin >> 1/17 Cefepime >>  Dose adjustments this admission:  Microbiology results: Hep B neg 12/31 blood>>negF 1/8 TA: normal flora 1/13 UCx: NGF 1/17 TA >> pending  Thank you for allowing pharmacy to be a part of this patient's care.  2/17, PharmD., BCPS Clinical Pharmacist Clinical phone for 05/23/19 until 5pm: [phone]

## 2019-05-23 NOTE — Progress Notes (Addendum)
RN received patient at shift change with labile blood pressure cuff readings, RN and RTs unable to place arterial line overnight. Pt tachycardic 120-130s, spO2 reading in 90s.   Dr. Denese Killings at bedside to assess patient at this time. STAT labs drawn and sent to lab, 2 L NS bolus and 2 amps Calcium given per MD orders. Neo gtt and Levo gtt at max rate at this time. MD at bedside placing arterial line.  1200 Critical calcium level called by lab, NP [facility] notified. Order received for 1 unit PRBCs and another NS 1 L bolus. Patient following command at this time, able to stick out tongue and weakly squeeze with left hand. Patient's daughter Neysa Bonito updated on patient condition. Will continue to monitor.

## 2019-05-23 NOTE — Progress Notes (Signed)
eLink Physician-Brief Progress Note Patient Name: Deborah Miller DOB: 26-Feb-1956 MRN: [id number]   Date of Service  05/23/2019  HPI/Events of Note  Nursing questions reliability of BP readings.   eICU Interventions  Will order: 1. RT to place A-line.     Intervention Category Major Interventions: Hypotension - evaluation and management  Shirleymae Hauth Eugene 05/23/2019, 2:39 AM

## 2019-05-23 NOTE — Progress Notes (Deleted)
NAME:  Deborah Miller, MRN:  [id number], DOB:  1956/05/03, LOS: 10 ADMISSION DATE:  05-25-2019, CONSULTATION DATE:  1/8 REFERRING MD: Belia Heman, CHIEF COMPLAINT:  dyspnea   Brief History   64 y/o female with COVID 19 pneumonia admitted on 1/1 to [facility] ICU, treated with BIPAP, high flow nasal cannula, remdesivir and ivermectin.  Developed sub cutaneous emphysema on 1/3.  developed progressive hypoxemia and dyspnea ultimately requiring intubation on 1/7, transferred to [facility] on 1/8. COVID infection initially diagnosed 12/23  Past Medical History  OSA HTN Morbid obesity GERD DM2 Cor pulmonale Asthma Thyroid nodule  Significant Hospital Events   1/1 admit [facility], bipap 1/3 sub q emphysema 1/7 intubated 1/12 tolerates pressure support weaning 1/13 weaned 13 hours, sleepy  Consults:  PCCM  Procedures:  ETT 1/7 >  R IJ CVL  1/7 > 1/15 Left PICC 1/15 >>   Significant Diagnostic Tests:  1/2 LE Doppler > DVT left peroneal vein 1/17 persistent bilateral interstitial alveolar infiltrates (personally reviewed) 1/19 CT abdomen shows absence of flow in all the major mesenteric vessels with evidence of early liver and renal infarction. Micro Data:  12/23 sars cov 2 > pos 1/8resp >> nml flora 1/13 urine >> ng  Antimicrobials:  12/31 Remdesivir > 1/4 1/4 doxycycline> 1/8 1/5 Ivermectin > 1/6  Interim history/subjective:  Continued shock overnight with no substantial reduction in vasopressor support. Further volume challenge did not improve pressor requirements.  Objective   Blood pressure (!) 44/28, pulse (!) 34, temperature (!) 103.1 F (39.5 C), temperature source Axillary, resp. rate (!) 32, height 4\' 7"  (1.397 m), weight 99.6 kg, SpO2 100 %. CVP:  [10 mmHg-11 mmHg] 10 mmHg  Vent Mode: PCV FiO2 (%):  [60 %-100 %] 60 % Set Rate:  [25 bmp] 25 bmp Vt Set:  [350 mL] 350 mL PEEP:  [12 cmH20-16 cmH20] 12 cmH20 Plateau Pressure:  [27 cmH20-39 cmH20] 36 cmH20    Intake/Output Summary (Last 24 hours) at 05/23/2019 1248 Last data filed at 05/23/2019 0400 Gross per 24 hour  Intake 2005.1 ml  Output 350 ml  Net 1655.1 ml   Filed Weights   05/21/19 0401 05/22/19 0500 05/23/19 0218  Weight: 95.9 kg 95.5 kg 99.6 kg    Examination:  General: Acute and chronically ill-appearing obese woman, short stature, intubated HENT: ET tube in place, mild pallor, no icterus, large neck PULM: Decreased breath sounds bilateral,, no crackles or wheezes CV: Regular, borderline tachycardic, no murmur, trace edem a.  Extremities cool.  Not mottled. GI: Obese, nondistended.  Moderate red current jelly stool. MSK: No deformities Neuro: Now sedated.    Resolved Hospital Problem list     Assessment & Plan:    Critically ill due to concurrent distributive shock requiring titration of vasopressors Severe metabolic acidosis indicating inadequate tissue perfusion in spite of high-dose vasopressors. Catastrophic CT showing loss of blood flow with impending infarction of splanchnic organs. Bicarbonate bolus Bicarbonate infusion Blood transfusion ordered to improve oxygen carrying capacity and to correct anemia  Have spoken to the patient's daughter and informed her of the results of the CT scan.  She remains in refractory shock with ongoing tissue ischemia which will only worsen the vicious cycle of shock.  I informed the daughter that her mother's condition is not correctable and that a transition to comfort care is most appropriate given her recent illness.  She has agreed.  We will arrange for a video visit prior to transition to comfort care and compassionate  extubation.   Critically ill due to ARDS due to COVID 19 pneumonia and exacerbation of known asthma -Failed trial of extubation with subsequent shock state. Continue full ventilator support until compassionate extubation.   Best practice:   Transition to comfort care.  Order set in place. CRITICAL CARE  Performed by: Kipp Brood   Total critical care time: 45 minutes  Critical care time was exclusive of separately billable procedures and treating other patients.  Critical care was necessary to treat or prevent imminent or life-threatening deterioration.  Critical care was time spent personally by me on the following activities: development of treatment plan with patient and/or surrogate as well as nursing, discussions with consultants, evaluation of patient's response to treatment, examination of patient, obtaining history from patient or surrogate, ordering and performing treatments and interventions, ordering and review of laboratory studies, ordering and review of radiographic studies, pulse oximetry, re-evaluation of patient's condition and participation in multidisciplinary rounds.  Kipp Brood, MD [facility] ICU Physician [place]  Pager: [phone] Mobile: [phone] After hours: [phone].   05/23/2019

## 2019-05-23 NOTE — Progress Notes (Signed)
Dollene Cleveland, RN and Ardelle Park, RN wasted 37ml of Dialudid IV gtt (50mg /141ml).

## 2019-05-23 NOTE — Progress Notes (Signed)
[facility] PCCM Evening Rounds  Profound shock, has been hypovolemic No further bleeding per nightshift RN However vasopressor requirement remains high Receiving 1 U PRVC now CVP 2 on my exam If no improvement in vasopressor requirement RN to administer 500cc LR bolus.  Heber Acton, MD [place] [facility] Pager: [phone] Cell: [phone] If no response, call [phone]

## 2019-05-23 NOTE — Progress Notes (Signed)
RT x 2 attempted to place aline.  Pt wrists very swollen, flash made with arrow but could not thread and maintain a wave or ability to draw blood back.  Charge RN called CRNA to attempt insertion.  RT available if needed for assistance.

## 2019-05-23 NOTE — Progress Notes (Signed)
eLink Physician-Brief Progress Note Patient Name: Deborah Miller DOB: [birth year] MRN: [id number]   Date of Service  05/23/2019  HPI/Events of Note  Hypotension - Phenylephrine and Norepinephrine IV infusions at ceiling doses.   eICU Interventions  Will order: 1. ABG now. 2. Increase ceiling on Norepinephrine IV infusion to 70 mcg/min.     Intervention Category Major Interventions: Hypotension - evaluation and management  Lenell Antu 05/23/2019, 4:39 AM

## 2019-05-23 NOTE — Progress Notes (Signed)
PT Cancellation Note  Patient Details Name: KIOSHA BUCHAN MRN: [id number] DOB: 11/11/1955   Cancelled Treatment:    Reason Eval/Treat Not Completed: Medical issues which prohibited therapy.  Pt hypotensive and vent settings too high for PT mobility at this time (PRVC, FiO2 60% PEEP 12).  PT will follow along for medical stability.  Thanks,  Corinna Capra, PT, DPT  Acute Rehabilitation [phone] pager [phone] office  @ [facility]: [phone]     Dimas Aguas 05/23/2019, 9:16 AM

## 2019-05-24 ENCOUNTER — Inpatient Hospital Stay (HOSPITAL_COMMUNITY): Payer: BC Managed Care – PPO

## 2019-05-24 LAB — CBC WITH DIFFERENTIAL/PLATELET
Abs Immature Granulocytes: 0.56 10*3/uL — ABNORMAL HIGH (ref 0.00–0.07)
Basophils Absolute: 0 10*3/uL (ref 0.0–0.1)
Basophils Relative: 0 %
Eosinophils Absolute: 0 10*3/uL (ref 0.0–0.5)
Eosinophils Relative: 0 %
HCT: 30.7 % — ABNORMAL LOW (ref 36.0–46.0)
Hemoglobin: 8 g/dL — ABNORMAL LOW (ref 12.0–15.0)
Immature Granulocytes: 4 %
Lymphocytes Relative: 21 %
Lymphs Abs: 2.8 10*3/uL (ref 0.7–4.0)
MCH: 26.2 pg (ref 26.0–34.0)
MCHC: 26.1 g/dL — ABNORMAL LOW (ref 30.0–36.0)
MCV: 100.7 fL — ABNORMAL HIGH (ref 80.0–100.0)
Monocytes Absolute: 0.4 10*3/uL (ref 0.1–1.0)
Monocytes Relative: 3 %
Neutro Abs: 9.3 10*3/uL — ABNORMAL HIGH (ref 1.7–7.7)
Neutrophils Relative %: 72 %
Platelets: 129 10*3/uL — ABNORMAL LOW (ref 150–400)
RBC: 3.05 MIL/uL — ABNORMAL LOW (ref 3.87–5.11)
RDW: 18.2 % — ABNORMAL HIGH (ref 11.5–15.5)
WBC: 13.1 10*3/uL — ABNORMAL HIGH (ref 4.0–10.5)
nRBC: 5 % — ABNORMAL HIGH (ref 0.0–0.2)

## 2019-05-24 LAB — POCT I-STAT 7, (LYTES, BLD GAS, ICA,H+H)
Acid-base deficit: 22 mmol/L — ABNORMAL HIGH (ref 0.0–2.0)
Bicarbonate: 10.1 mmol/L — ABNORMAL LOW (ref 20.0–28.0)
Calcium, Ion: 0.93 mmol/L — ABNORMAL LOW (ref 1.15–1.40)
HCT: 18 % — ABNORMAL LOW (ref 36.0–46.0)
Hemoglobin: 6.1 g/dL — CL (ref 12.0–15.0)
O2 Saturation: 98 %
Patient temperature: 97.1
Potassium: 7.7 mmol/L (ref 3.5–5.1)
Sodium: 137 mmol/L (ref 135–145)
TCO2: 12 mmol/L — ABNORMAL LOW (ref 22–32)
pCO2 arterial: 57.6 mmHg — ABNORMAL HIGH (ref 32.0–48.0)
pH, Arterial: 6.844 — CL (ref 7.350–7.450)
pO2, Arterial: 194 mmHg — ABNORMAL HIGH (ref 83.0–108.0)

## 2019-05-24 LAB — PREPARE RBC (CROSSMATCH)

## 2019-05-24 LAB — COMPREHENSIVE METABOLIC PANEL
ALT: 10627 U/L — ABNORMAL HIGH (ref 0–44)
AST: >10000 U/L — ABNORMAL HIGH (ref 15–41)
Albumin: 1.3 g/dL — ABNORMAL LOW (ref 3.5–5.0)
Alkaline Phosphatase: 308 U/L — ABNORMAL HIGH (ref 38–126)
Anion gap: 21 — ABNORMAL HIGH (ref 5–15)
BUN: 73 mg/dL — ABNORMAL HIGH (ref 8–23)
CO2: 13 mmol/L — ABNORMAL LOW (ref 22–32)
Calcium: 6.8 mg/dL — ABNORMAL LOW (ref 8.9–10.3)
Chloride: 107 mmol/L (ref 98–111)
Creatinine, Ser: 2.72 mg/dL — ABNORMAL HIGH (ref 0.44–1.00)
GFR calc Af Amer: 21 mL/min — ABNORMAL LOW (ref >60)
GFR calc non Af Amer: 18 mL/min — ABNORMAL LOW (ref >60)
Glucose, Bld: 176 mg/dL — ABNORMAL HIGH (ref 70–99)
Potassium: >7.5 mmol/L (ref 3.5–5.1)
Sodium: 141 mmol/L (ref 135–145)
Total Bilirubin: 1.5 mg/dL — ABNORMAL HIGH (ref 0.3–1.2)
Total Protein: 4.2 g/dL — ABNORMAL LOW (ref 6.5–8.1)

## 2019-05-24 LAB — CBC
HCT: 29.4 % — ABNORMAL LOW (ref 36.0–46.0)
Hemoglobin: 7.9 g/dL — ABNORMAL LOW (ref 12.0–15.0)
MCH: 26.4 pg (ref 26.0–34.0)
MCHC: 26.9 g/dL — ABNORMAL LOW (ref 30.0–36.0)
MCV: 98.3 fL (ref 80.0–100.0)
Platelets: 146 10*3/uL — ABNORMAL LOW (ref 150–400)
RBC: 2.99 MIL/uL — ABNORMAL LOW (ref 3.87–5.11)
RDW: 17.6 % — ABNORMAL HIGH (ref 11.5–15.5)
WBC: 9.1 10*3/uL (ref 4.0–10.5)
nRBC: 7.7 % — ABNORMAL HIGH (ref 0.0–0.2)

## 2019-05-24 LAB — GLUCOSE, CAPILLARY
Glucose-Capillary: 176 mg/dL — ABNORMAL HIGH (ref 70–99)
Glucose-Capillary: 182 mg/dL — ABNORMAL HIGH (ref 70–99)
Glucose-Capillary: 89 mg/dL (ref 70–99)

## 2019-05-24 MED ORDER — SODIUM BICARBONATE 8.4 % IV SOLN
INTRAVENOUS | Status: AC
  Filled 2019-05-24: qty 100

## 2019-05-24 MED ORDER — GLYCOPYRROLATE 1 MG PO TABS
1.0000 mg | ORAL_TABLET | ORAL | Status: DC | PRN
  Filled 2019-05-24: qty 1

## 2019-05-24 MED ORDER — GLYCOPYRROLATE 0.2 MG/ML IJ SOLN: 0.2000 mg | INTRAMUSCULAR | Status: DC | PRN

## 2019-05-24 MED ORDER — MORPHINE SULFATE (PF) 2 MG/ML IV SOLN
2.0000 mg | INTRAVENOUS | Status: DC | PRN
  Administered 2019-05-24: 2 mg via INTRAVENOUS
  Filled 2019-05-24: qty 1

## 2019-05-24 MED ORDER — METRONIDAZOLE IN NACL 5-0.79 MG/ML-% IV SOLN
500.0000 mg | Freq: Three times a day (TID) | INTRAVENOUS | Status: DC
  Administered 2019-05-24: 09:00:00 500 mg via INTRAVENOUS
  Filled 2019-05-24 (×4): qty 100

## 2019-05-24 MED ORDER — SODIUM BICARBONATE 8.4 % IV SOLN
100.0000 meq | Freq: Once | INTRAVENOUS | Status: AC
  Administered 2019-05-24: 10:00:00 100 meq via INTRAVENOUS

## 2019-05-24 MED ORDER — HYDROCORTISONE NA SUCCINATE PF 100 MG IJ SOLR
50.0000 mg | Freq: Four times a day (QID) | INTRAMUSCULAR | Status: DC
  Administered 2019-05-24: 09:00:00 50 mg via INTRAVENOUS
  Filled 2019-05-24: qty 2

## 2019-05-24 MED ORDER — SODIUM CHLORIDE 0.9 % IV SOLN
1.0000 g | Freq: Once | INTRAVENOUS | Status: DC
  Filled 2019-05-24: qty 10

## 2019-05-24 MED ORDER — SODIUM BICARBONATE-DEXTROSE 150-5 MEQ/L-% IV SOLN
150.0000 meq | INTRAVENOUS | Status: DC
  Administered 2019-05-24: 10:00:00 150 meq via INTRAVENOUS
  Filled 2019-05-24 (×3): qty 1000

## 2019-05-24 MED ORDER — SODIUM CHLORIDE 0.9% IV SOLUTION: Freq: Once | INTRAVENOUS | Status: DC

## 2019-05-24 MED ORDER — MORPHINE BOLUS VIA INFUSION: 2.0000 mg | INTRAVENOUS | Status: DC | PRN

## 2019-05-24 MED ORDER — POLYVINYL ALCOHOL 1.4 % OP SOLN
1.0000 [drp] | Freq: Four times a day (QID) | OPHTHALMIC | Status: DC | PRN
  Filled 2019-05-24: qty 15

## 2019-05-24 MED ORDER — IOHEXOL 350 MG/ML SOLN
100.0000 mL | Freq: Once | INTRAVENOUS | Status: AC | PRN
  Administered 2019-05-24: 100 mL via INTRAVENOUS

## 2019-05-24 MED ORDER — ACETAMINOPHEN 650 MG RE SUPP: 650.0000 mg | Freq: Four times a day (QID) | RECTAL | Status: DC | PRN

## 2019-05-24 MED ORDER — DIPHENHYDRAMINE HCL 50 MG/ML IJ SOLN: 25.0000 mg | INTRAMUSCULAR | Status: DC | PRN

## 2019-05-24 MED ORDER — ACETAMINOPHEN 325 MG PO TABS: 650.0000 mg | ORAL_TABLET | Freq: Four times a day (QID) | ORAL | Status: DC | PRN

## 2019-05-24 MED ORDER — MORPHINE 100MG IN NS 100ML (1MG/ML) PREMIX INFUSION
2.0000 mg/h | INTRAVENOUS | Status: DC
  Filled 2019-05-24: qty 100

## 2019-05-24 MED ORDER — DEXTROSE 5 % IV SOLN: INTRAVENOUS | Status: DC

## 2019-05-25 LAB — TYPE AND SCREEN
ABO/RH(D): O POS
Antibody Screen: NEGATIVE
Unit division: 0
Unit division: 0
Unit division: 0

## 2019-05-25 LAB — BPAM RBC
Blood Product Expiration Date: 202102122359
Blood Product Expiration Date: 202102132359
Blood Product Expiration Date: 202102132359
ISSUE DATE / TIME: 202101181857
Unit Type and Rh: 5100
Unit Type and Rh: 5100
Unit Type and Rh: 5100

## 2019-05-25 LAB — CULTURE, RESPIRATORY W GRAM STAIN: Culture: NORMAL

## 2019-06-06 NOTE — Progress Notes (Signed)
Pt has passed provider and family has already been notified. All lines are removed. [facility] has been contacted by care link.

## 2019-06-06 NOTE — Progress Notes (Signed)
Provider has been notified of pt's Potassium that is greater than 7.5 per lab tech

## 2019-06-06 NOTE — Progress Notes (Signed)
Patient transported to CT and back to the ICU on current vent settings, and FiO2 100%.  Patient tolerated transports well;no complications noted.

## 2019-06-06 NOTE — Death Summary Note (Signed)
DEATH SUMMARY   Patient Details  Name: Deborah Miller MRN: [id number] DOB: 1955/10/28  Admission/Discharge Information   Admit Date:  05-21-19  Date of Death: Date of Death: June 01, 2019  Time of Death: Time of Death: 1310  Length of Stay: 09-21-2022  Referring Physician: Danella Penton, MD   Reason(s) for Hospitalization  COVID-19 pneumonia  Diagnoses  Preliminary cause of death: Septic shock due to ischemic colitis Secondary Diagnoses (including complications and co-morbidities):  Active Problems:   Acute respiratory distress syndrome (ARDS) due to COVID-19 virus (HCC) Shock liver Acute kidney injury with hyperkalemia. Mesenteric ischemia Past Medical History:  Diagnosis Date  . Acid reflux   . Allergic genetic state   . Anxiety   . Arthritis   . Asthma 1957  . Breast microcalcification, mammographic 2014  . Breast screening, unspecified   . Carpal tunnel syndrome 1999   repaired left wrist  . Cor pulmonale (HCC)   . DDD (degenerative disc disease), lumbar   . Depression   . Diabetes mellitus without complication (HCC) 2019   borderline, follows diabetic diet often  . Environmental allergies   . Esophageal spasm   . Fecal smearing   . Headache    sinus migraines. takes OTC meds for allergies  . Heart murmur    as a child, not treated  . Hemorrhoids 2012  . History of hiatal hernia   . Hypertension   . Lump in thyroid 2019   having an ultrasound 10/2017 to rule out tumor or salivary gland enlargement  . Morbid obesity (HCC)   . Osteoarthritis of spine with radiculopathy, lumbar region   . Other dysphagia   . Pain    chronic lbp,stenosis  . PONV (postoperative nausea and vomiting)    nausea and vomitting due to morphine  . Primary osteoarthritis of left knee   . Primary osteoarthritis of right hip   . Right thyroid nodule   . Shortness of breath dyspnea    increased with anxiety  . Sleep apnea    cpap     Brief Hospital Course (including significant  findings, care, treatment, and services provided and events leading to death)  Deborah Miller is a 64 y.o. year old female who originally presented to [facility] 1/1.  Initially treated for Covid pneumonia with BiPAP, high flow nasal cannula, remdesivir and ivermectin.  Continue to worsen and ultimately required intubation 1/7 and transferred to [facility] 20-May-2022.  By 1/12 she was tolerating pressure support weaning but remained quite sedated.  She was well enough by 1/17 to tolerate a trial spontaneous breathing and was extubated.  Unfortunately, she developed significant respiratory distress and required reintubation.  She remained hypotensive overnight requiring initiation of vasopressors.  By the morning of 1118 she was on maximal doses of norepinephrine and phenylephrine.  She had passage of red current jelly stools suggestive of ischemic colitis.  A bedside ultrasound was performed showing normal cardiac function but severe intravascular volume contraction.  Fluid resuscitation improved but did not eliminate her vasopressor requirements.  She had no further blood per rectum.  She remained in shock despite vasopressors and repeated fluid trials.  By the morning of 05/31/22 she remained in refractory shock with worsening acidosis, hyperkalemia and worsening renal function.  A CT scan of the abdomen was performed which showed absence of flow in the entirety of the the mesenteric trunk with signs of infarction of the liver and kidneys.  Whether this was the primary inciting cause or a result  of higher vasopressor therapy was not clear. Nevertheless, her prognosis on the basis of refractory shock with no improvement in perfusion was dismal.  The family was informed of this and the decision was made, given her already protracted course of COVID-19 pneumonia to transition to comfort care.  She passed away soon after compassionate extubation.  No autopsy was performed.    Pertinent Labs and Studies  Significant  Diagnostic Studies DG Chest 1 View  Result Date: 05/22/2019 CLINICAL DATA:  64 year old female status post NG tube placement. EXAM: ABDOMEN - 1 VIEW; CHEST  1 VIEW COMPARISON:  Earlier radiograph dated 05/22/2019. FINDINGS: Interval placement of an enteric tube with tip and side-port in the distal stomach. Endotracheal tube approximately 2.3 cm above the carina. Left-sided PICC in similar position. Diffuse bilateral pulmonary densities similar or slightly worsened. No pneumothorax. Stable cardiomediastinal silhouette. Degenerative changes of the spine. IMPRESSION: 1. Interval placement of an enteric tube with tip in the distal stomach. 2. Diffuse bilateral pulmonary opacities. Electronically Signed   By: Elgie Collard M.D.   On: 05/22/2019 19:03   DG Abd 1 View  Result Date: 05/22/2019 CLINICAL DATA:  64 year old female status post NG tube placement. EXAM: ABDOMEN - 1 VIEW; CHEST  1 VIEW COMPARISON:  Earlier radiograph dated 05/22/2019. FINDINGS: Interval placement of an enteric tube with tip and side-port in the distal stomach. Endotracheal tube approximately 2.3 cm above the carina. Left-sided PICC in similar position. Diffuse bilateral pulmonary densities similar or slightly worsened. No pneumothorax. Stable cardiomediastinal silhouette. Degenerative changes of the spine. IMPRESSION: 1. Interval placement of an enteric tube with tip in the distal stomach. 2. Diffuse bilateral pulmonary opacities. Electronically Signed   By: Elgie Collard M.D.   On: 05/22/2019 19:03   DG Abd 1 View  Result Date: 05/12/2019 CLINICAL DATA:  64 year old female intubated and enteric tube placed. EXAM: ABDOMEN - 1 VIEW COMPARISON:  Portable chest at the same time. CT Abdomen and Pelvis 07/11/2011. FINDINGS: Portable AP supine view at 1543 hours. The enteric tube is looped in the stomach, left abdomen. Side hole at the level of the gastric body. The gastric distention seen on the portable chest does not persist.  Visualized bowel gas pattern is non obstructed. Stable lung bases. Stable cholecystectomy clips. IMPRESSION: 1. Enteric tube looped in the stomach, with resolved distension of the stomach seen on the contemporary portable chest. 2. Normal visible bowel gas pattern. Electronically Signed   By: Odessa Fleming M.D.   On: 05/12/2019 16:14   US Venous Img Lower Bilateral (DVT)  Result Date: 05/07/2019 CLINICAL DATA:  Shortness of breath. Positive COVID-19 infection. Evaluate for DVT. EXAM: BILATERAL LOWER EXTREMITY VENOUS DOPPLER ULTRASOUND TECHNIQUE: Gray-scale sonography with graded compression, as well as color Doppler and duplex ultrasound were performed to evaluate the lower extremity deep venous systems from the level of the common femoral vein and including the common femoral, femoral, profunda femoral, popliteal and calf veins including the posterior tibial, peroneal and gastrocnemius veins when visible. The superficial great saphenous vein was also interrogated. Spectral Doppler was utilized to evaluate flow at rest and with distal augmentation maneuvers in the common femoral, femoral and popliteal veins. COMPARISON:  Bilateral lower extremity venous Doppler ultrasound-04/22/2019 FINDINGS: RIGHT LOWER EXTREMITY Common Femoral Vein: No evidence of thrombus. Normal compressibility, respiratory phasicity and response to augmentation. Saphenofemoral Junction: No evidence of thrombus. Normal compressibility and flow on color Doppler imaging. Profunda Femoral Vein: No evidence of thrombus. Normal compressibility and flow on color Doppler imaging.  Femoral Vein: No evidence of thrombus. Normal compressibility, respiratory phasicity and response to augmentation. Popliteal Vein: No evidence of thrombus. Normal compressibility, respiratory phasicity and response to augmentation. Calf Veins: No evidence of thrombus. Normal compressibility and flow on color Doppler imaging. Superficial Great Saphenous Vein: No evidence of  thrombus. Normal compressibility. Other Findings:  None. _________________________________________________________ LEFT LOWER EXTREMITY Common Femoral Vein: No evidence of thrombus. Normal compressibility, respiratory phasicity and response to augmentation. Saphenofemoral Junction: No evidence of thrombus. Normal compressibility and flow on color Doppler imaging. Profunda Femoral Vein: No evidence of thrombus. Normal compressibility and flow on color Doppler imaging. Femoral Vein: No evidence of thrombus. Normal compressibility, respiratory phasicity and response to augmentation. Popliteal Vein: No evidence of thrombus. Normal compressibility, respiratory phasicity and response to augmentation. Calf Veins: There is hypoechoic occlusive thrombus within the left peroneal vein (image 38 and 39. The left posterior tibial veins appear patent where imaged. Superficial Great Saphenous Vein: No evidence of thrombus. Normal compressibility. Other Findings:  None. IMPRESSION: 1. The examination is positive for occlusive DVT involving the left peroneal vein. There is no extension of this distal left lower extremity tibial DVT to the more proximal venous system of the left lower extremity. 2. No evidence of DVT within the right lower extremity. Electronically Signed   By: Simonne Come M.D.   On: 05/07/2019 12:26   Portable Chest xray  Result Date: 05/23/2019 CLINICAL DATA:  64 year old female with a history acute respiratory failure EXAM: PORTABLE CHEST 1 VIEW COMPARISON:  05/22/2019 FINDINGS: Cardiomediastinal silhouette unchanged in size and contour. Right rotation of the patient. Unchanged endotracheal tube terminating at the level of the clavicular heads suitably above the carina. Unchanged gastric tube terminating out of the field of view. Unchanged left upper extremity PICC with the tip appearing to terminate superior vena cava. Low lung volumes with mixed reticulonodular opacities and patchy airspace opacities of the  bilateral lungs. No pneumothorax. Blunting of the bilateral costophrenic angles. IMPRESSION: Similar appearance of the chest x-ray with mixed interstitial and airspace disease of the bilateral lungs, with the primary differential multifocal pneumonia and edema. Unchanged endotracheal tube, gastric tube, left upper extremity PICC Electronically Signed   By: Gilmer Mor D.O.   On: 05/23/2019 08:05   DG CHEST PORT 1 VIEW  Result Date: 05/22/2019 CLINICAL DATA:  64 year old female with history of intubation. EXAM: PORTABLE CHEST 1 VIEW COMPARISON:  Chest x-ray 05/20/2019. FINDINGS: An endotracheal tube is in place with tip 2.1 cm above the carina. There is a left upper extremity PICC with tip terminating in the mid superior vena cava. Previously noted nasogastric tube has been removed. Patchy multifocal interstitial and airspace disease throughout the lungs bilaterally, most confluent in the right middle lung which has significantly worsened compared to the prior study. Slight improved aeration throughout the left mid to lower lung. Small left pleural effusion. No definite right pleural effusion. No pneumothorax. No evidence of pulmonary edema. Heart size is normal. Mediastinal contours are distorted by patient's rotation the right. IMPRESSION: 1. Support apparatus, as above. 2. Persistent multilobar bilateral pneumonia again noted, worsened in the right mid lung and improved throughout the left mid to lower lung. 3. Small left pleural effusion. Electronically Signed   By: Trudie Reed M.D.   On: 05/22/2019 19:06   DG Chest Port 1 View  Result Date: 05/20/2019 CLINICAL DATA:  Acute respiratory distress syndrome due to COVID-19. EXAM: PORTABLE CHEST 1 VIEW COMPARISON:  05/18/2019 FINDINGS: Nasogastric tube is looped once within  the stomach with tip over the left lateral abdomen likely within the stomach and less likely jejunum. Endotracheal tube and right IJ central venous catheter are unchanged. Lungs are  adequately inflated demonstrate stable bilateral airspace process likely multifocal pneumonia and less likely edema. No effusion. Stable cardiomegaly. Remainder of the exam is unchanged. IMPRESSION: 1. Stable bilateral hazy airspace process which may be due to multifocal infection versus edema. 2.  Stable cardiomegaly. 3.  Tubes and lines as described. Electronically Signed   By: Marin Olp M.D.   On: 05/20/2019 07:48   DG Chest Port 1 View  Result Date: 05/18/2019 CLINICAL DATA:  Acute respiratory failure, COVID-19 positive EXAM: PORTABLE CHEST 1 VIEW COMPARISON:  Chest radiograph from one day prior. FINDINGS: Endotracheal tube tip is 2.9 cm above the carina. Enteric tube terminates in the gastric fundus. Right internal jugular central venous catheter terminates at the cavoatrial junction. Stable cardiomediastinal silhouette with mild cardiomegaly. No pneumothorax. No pleural effusion. Patchy opacities throughout both lungs, most prominent in the lower lungs, not substantially changed. IMPRESSION: 1. Well-positioned support structures.  No pneumothorax. 2. Stable patchy opacities throughout both lungs, most prominent in the lower lungs, without appreciable change, compatible with COVID-19 pneumonia. 3. Cardiomegaly. Electronically Signed   By: Ilona Sorrel M.D.   On: 05/18/2019 08:55   DG CHEST PORT 1 VIEW  Addendum Date: 05/17/2019   ADDENDUM REPORT: 05/17/2019 12:00 ADDENDUM: These results will be called to the ordering clinician or representative by the Radiologist Assistant, and communication documented in the PACS or zVision Dashboard. Electronically Signed   By: Zetta Bills M.D.   On: 05/17/2019 12:00   Result Date: 05/17/2019 CLINICAL DATA:  Acute respiratory failure EXAM: PORTABLE CHEST 1 VIEW COMPARISON:  05/15/2019 FINDINGS: Endotracheal tube extends into the proximal portion of the right mainstem bronchus. Right sided central venous catheter terminates at the caval to atrial junction.  Gastric tube courses into the stomach, below the left hemidiaphragm. Cardiomediastinal contours remain enlarged and partially obscured in the left chest due to interstitial and airspace opacities. Similar appearance of pulmonary parenchymal findings with interstitial and airspace opacities when compared to the prior study of 05/15/2019. Visualized skeletal structures with spinal degenerative and glenohumeral degenerative changes. IMPRESSION: 1. Endotracheal tube extends into the proximal right mainstem bronchus. Recommend approximately 2.5-3 cm retraction for more optimal placement. 2. Stable appearance of diffuse interstitial and airspace disease. 3. Call is out to the referring provider to further discuss findings in the above case. Electronically Signed: By: Zetta Bills M.D. On: 05/17/2019 09:25   DG Chest Port 1 View  Result Date: 05/16/2019 CLINICAL DATA:  Acute respiratory failure EXAM: PORTABLE CHEST 1 VIEW COMPARISON:  05/12/2019, 05/11/2019, 05/08/2019 FINDINGS: Endotracheal tube tip about a cm superior to the carina. Esophageal tube tip is below the diaphragm. Right-sided central venous catheter tip over the distal SVC. Slightly improved aeration of the apices. Progressed interstitial and consolidative disease in the mid to lower lung zones. No pleural effusion. Stable enlarged cardiomediastinal silhouette. No pneumothorax. Questionable pleural line/small pneumothorax at the right apex. New soft tissue emphysema at the chest walls, neck and shoulders. IMPRESSION: 1. Endotracheal tube tip about a cm superior to the carina 2. Slightly better aeration at the apices though with overall worsening of interstitial and alveolar disease at the lung bases. 3. New soft tissue emphysema within the chest wall and at the neck. Findings are questionable for a tiny right apical pneumothorax, attention on follow-up chest radiograph Electronically Signed   By:  Jasmine Pang M.D.   On: 05/14/2019 18:29   DG CHEST  PORT 1 VIEW  Result Date: 05/15/2019 CLINICAL DATA:  Acute respiratory failure EXAM: PORTABLE CHEST 1 VIEW COMPARISON:  May 14, 2019 FINDINGS: The ETT terminates 1.2 cm above the carina. The NG tube terminates in the stomach. A right central line terminates near the caval atrial junction. Bilateral pulmonary infiltrates remain but are improved. The air in the subcutaneous tissues of the chest wall is nearly resolved. No pneumothorax. No change in the cardiomediastinal silhouette. IMPRESSION: 1. The ETT terminates 1.2 cm above the carina. Recommend withdrawing 1 or 2 cm. Other support apparatus as above. 2. Persistent but improving infiltrates. 3. Near complete resolution of subcutaneous air in the soft tissues of the chest wall. Electronically Signed   By: Gerome Sam III M.D   On: 05/15/2019 16:38   DG Chest Port 1 View  Result Date: 05/14/2019 CLINICAL DATA:  Acute respiratory failure. EXAM: PORTABLE CHEST 1 VIEW COMPARISON:  05/31/2019 at 3:42 a.m. FINDINGS: The patient is rotated to the left. The endotracheal tube terminates 1 cm above the carina. An enteric tube is looped in the stomach with tip in the region of the gastric fundus. A right jugular catheter terminates over the lower SVC. The cardiomediastinal silhouette is grossly unchanged allowing for patient rotation. Widespread patchy and confluent airspace opacities throughout both lungs have increased. No sizable pleural effusion is evident. Widespread soft tissue emphysema has slightly decreased. No pneumothorax is evident. IMPRESSION: 1. Endotracheal tube terminates 1 cm above the carina. Consider retracting 1-2 cm. 2. Worsening bilateral lung opacities compatible with pneumonia. 3. Slightly decreased soft tissue emphysema. Electronically Signed   By: Sebastian Ache M.D.   On: 05/14/2019 06:25   DG Chest Port 1 View  Result Date: 05/30/2019 CLINICAL DATA:  Acute respiratory failure EXAM: PORTABLE CHEST 1 VIEW COMPARISON:  Yesterday FINDINGS:  Widespread soft tissue emphysema, mildly improved with less distended airspaces. No visible pneumothorax. The enteric tube tip is in the stomach and the endotracheal tube tip is just below the clavicular heads. Right IJ line with tip at the distal SVC. Bilateral pulmonary infiltrates correlating with history of COVID-19. Normal heart size. IMPRESSION: 1. Stable hardware positioning and bilateral airspace disease. 2. Extensive soft tissue emphysema with mild interval improvement. Electronically Signed   By: Marnee Spring M.D.   On: 06/04/2019 04:39   DG Chest Port 1 View  Result Date: 05/12/2019 CLINICAL DATA:  64 year old female intubated. Positive COVID-19. EXAM: PORTABLE CHEST 1 VIEW COMPARISON:  1102 hours today. FINDINGS: In good position between the level portable AP semi upright view at 1541 hours. Endotracheal tube tip the clavicles and carina. Enteric tube courses to the abdomen, tip not included. Right IJ central line also placed, tip at the level of the cavoatrial junction. Severe bilateral chest wall and lower neck subcutaneous emphysema again noted. It is unclear whether there might be a persistent small right pneumothorax. No mediastinal shift. Stable cardiac size and mediastinal contours. Coarse bilateral pulmonary opacity, with improved left upper lung and right lower lung ventilation from earlier today. Partially visible gas distended stomach. IMPRESSION: 1. Right IJ central line placed, tip at the level of the cavoatrial junction. 2. ETT in good position. Enteric tube courses into the stomach, tip not included. 3. Gastric distention, consider NG tube to suction. 4. Mildly improved bilateral ventilation from earlier today with continued severe bilateral chest wall and lower neck subcutaneous emphysema again noted. No obvious pneumothorax identified now.  Electronically Signed   By: Odessa FlemingH  Hall M.D.   On: 05/12/2019 16:12   DG Chest Port 1 View  Result Date: 05/12/2019 CLINICAL DATA:  Shortness of  breath, COVID positive EXAM: PORTABLE CHEST 1 VIEW COMPARISON:  05/08/2019 FINDINGS: Diffuse extensive subcutaneous emphysema, increasing since prior study. Small right apical pneumothorax now visible. Extensive airspace disease bilaterally. Mild cardiomegaly. No visible effusions. IMPRESSION: Small right apical pneumothorax, 5-10%. Extensive worsening subcutaneous emphysema. Patchy bilateral airspace disease, unchanged. Electronically Signed   By: Charlett NoseKevin  Dover M.D.   On: 05/12/2019 11:16   DG Chest Port 1 View  Result Date: 05/08/2019 CLINICAL DATA:  COVID 19 positive, shortness of breath EXAM: PORTABLE CHEST 1 VIEW COMPARISON:  05/05/2019 FINDINGS: Stable cardiomediastinal contours. Patchy bilateral airspace opacities, which appears slightly more confluent within the right lung base. No discernible pleural effusion or pneumothorax. Extensive subcutaneous emphysema within the chest wall and right neck. IMPRESSION: 1. Patchy bilateral airspace opacities, which appears slightly more confluent within the right lung base. 2. Extensive subcutaneous emphysema within the chest wall and right neck. No discernible pneumothorax or pleural effusion. Electronically Signed   By: Duanne GuessNicholas  Plundo D.O.   On: 05/08/2019 13:15   DG Chest Port 1 View  Result Date: 05/05/2019 CLINICAL DATA:  Sent from dr Hyacinth Meekermiller. + covid, bilateral PNA. Put on oxygen for by PCP Monday, sats in low 80s at office on 3 L [place]. Received 80 mg kenalog there EXAM: PORTABLE CHEST 1 VIEW COMPARISON:  Chest radiograph 03/03/2014 FINDINGS: Stable cardiomediastinal contours with enlarged heart size. There are new diffuse bilateral patchy airspace opacities. No pneumothorax or large pleural effusion. No acute finding in the visualized skeleton. IMPRESSION: 1. New diffuse bilateral patchy airspace opacities most likely representing multifocal pneumonia. 2. Cardiomegaly. Electronically Signed   By: Emmaline KluverNancy  Ballantyne M.D.   On: 05/05/2019 12:41   DG Abd  Portable 1V  Result Date: 05/16/2019 CLINICAL DATA:  Feeding tube EXAM: ABDOMEN - 1 VIEW COMPARISON:  05/12/2019 FINDINGS: Esophageal tube is looped upon itself distally with the tip positioned over the gastric fundus. The abdomen is relatively gasless IMPRESSION: Esophageal tube tip projects over the gastric fundal region beneath the left diaphragm Electronically Signed   By: Jasmine PangKim  Fujinaga M.D.   On: 2019-05-10 18:30   US EKG SITE RITE  Result Date: 05/20/2019 If Site Rite image not attached, placement could not be confirmed due to current cardiac rhythm.  CT Angio Abd/Pel w/ and/or w/o  Result Date: 05/30/2019 CLINICAL DATA:  64 year old with COVID-19 and evaluate for abdominal ischemia. EXAM: CT ANGIOGRAPHY ABDOMEN AND PELVIS WITH CONTRAST AND WITHOUT CONTRAST TECHNIQUE: Multidetector CT imaging of the abdomen and pelvis was performed using the standard protocol during bolus administration of intravenous contrast. Multiplanar reconstructed images and MIPs were obtained and reviewed to evaluate the vascular anatomy. CONTRAST:  100mL OMNIPAQUE IOHEXOL 350 MG/ML SOLN COMPARISON:  07/11/2011 FINDINGS: VASCULAR Aorta: Normal caliber of the abdominal aorta without atherosclerotic disease or dissection. Mild stranding around the abdominal aorta is probably related to overall edematous state rather than focal aortic inflammation. Celiac: Minimal flow in the celiac trunk. This is new compared to the examination on 2013. Stranding near the celiac trunk and this area is poorly characterized. The configuration of celiac trunk raises the possibility of median arcuate ligament compression. Inflammation or vasculitis involving celiac trunk cannot be excluded. Limited evaluation of the celiac trunk branch vessels due to the small caliber of these vessels and celiac trunk. There appears to be minimal flow  in the splenic artery. SMA: Proximal SMA is widely patent. Distal branches of the SMA are poorly characterized due  to the small size. Renals: Bilateral renal arteries are widely patent. Limited evaluation of the distal branch vessels. IMA: Limited evaluation of the IMA.  IMA is very small. Inflow: Iliac arteries are very small. Mild atherosclerotic disease in the distal right common iliac artery. Left external iliac artery is small measuring between 3-4 mm. There is a central line in the left common femoral artery. Proximal Outflow: Proximal femoral arteries are patent but very small. Veins: Normal appearance of the IVC and renal veins. The portal venous system appears to be patent. Review of the MIP images confirms the above findings. NON-VASCULAR Lower chest: Extensive airspace disease and consolidation at the lung bases. Findings are compatible with bilateral pneumonia. A small amount of gas in the lower anterior mediastinum. Trace right pleural fluid. Hepatobiliary: The liver has heterogeneous appearance that persists on the delayed images. Gallbladder has been removed. No biliary dilatation. Pancreas: Unremarkable. No pancreatic ductal dilatation or surrounding inflammatory changes. Spleen: There is minimal enhancement within the spleen even on the delayed images. Findings compatible with minimal arterial flow to the spleen. There is some venous flow in the splenic vein. Adrenals/Urinary Tract: Normal adrenal glands. Striated enhancement in the left kidney particularly in the upper pole. There is a wedge-shaped area of decreased attenuation in the mid left kidney. Foci of decreased attenuation in the lower pole of both kidneys. Heterogeneous enhancement in the mid/upper pole of the right kidney. Mild perinephric stranding. Negative for hydronephrosis. There is a Foley catheter within the urinary bladder. Urinary bladder is decompressed. There is no significant contrast excretion on the delayed renal images. Stomach/Bowel: Nasogastric tube terminates in the proximal duodenum. Rectum is distended with a large amount of stool.  There is dependent edema posterior to the rectum. Moderate amount of stool in the right colon. No evidence for bowel obstruction. No focal bowel thickening. Lymphatic: No abdominopelvic lymphadenopathy. Reproductive: Uterus and bilateral adnexa are unremarkable. Other: Dependent edema in the presacral region of the pelvis. Mild stranding in the retroperitoneum likely represents edema. No significant ascites. Negative for free air. Musculoskeletal: Right hip replacement is located. Extensive degenerative facet arthropathy in the lower lumbar spine. Multilevel degenerative endplate disease in lower thoracic spine. IMPRESSION: VASCULAR 1. Arterial structures are small and suggestive for diffuse vasoconstriction. 2. Minimal flow in the celiac trunk which is new compared to the examination on 2013. No significant atherosclerotic disease at the origin of the celiac trunk and the minimal flow could be related to severe vasoconstriction or possibly vasculitis. Difficult to exclude underlying median arcuate ligament compression. 3. No significant parenchymal enhancement in the spleen is suggestive for minimal flow in the splenic artery. 4. No significant arterial flow identified in the IMA. 5. Arterial line in left common femoral artery. NON-VASCULAR 1. No significant parenchymal enhancement in the spleen. 2. Striated enhancement in both kidneys is suggestive for bilateral renal infarcts. 3. Heterogeneous appearance of the liver is probably associated with hepatic steatosis but there may also be a component of vascular compromise. 4. No gross abnormality to the bowel structures. Large amount of stool in the rectum. 5. Nasogastric tube in the proximal duodenum. 6. Extensive parenchymal disease in the lower lungs with a small right pleural effusion. 7. Small amount of pneumomediastinum. These results were called by telephone at the time of interpretation on 2019-06-23 at 10:08 am to provider [facility] , who verbally  acknowledged these  results. Electronically Signed   By: Richarda OverlieAdam  Henn M.D.   On: 05/29/2019 10:16    Microbiology Recent Results (from the past 240 hour(s))  Culture, Urine     Status: None   Collection Time: 05/18/19  4:24 PM   Specimen: Urine, Random  Result Value Ref Range Status   Specimen Description   Final    URINE, RANDOM Performed at [facility], [address] [place], [place] [address]    Special Requests   Final    NONE Performed at [facility], [address] [place], [place] [address]    Culture   Final    NO GROWTH Performed at [facility], [address] [place], [place] [address]    Report Status 05/20/2019 FINAL  Final  Culture, respiratory (non-expectorated)     Status: None (Preliminary result)   Collection Time: 05/22/19  9:08 PM   Specimen: Tracheal Aspirate; Respiratory  Result Value Ref Range Status   Specimen Description   Final    TRACHEAL ASPIRATE Performed at [facility], [address] [place], [place] [address]    Special Requests   Final    NONE Performed at [facility], [address] [place], [place] [address]    Gram Stain   Final    MODERATE WBC PRESENT, PREDOMINANTLY PMN FEW GRAM POSITIVE COCCI IN PAIRS IN CHAINS RARE GRAM NEGATIVE RODS    Culture   Final    CULTURE REINCUBATED FOR BETTER GROWTH Performed at [facility], [address] [place], [place] [address]    Report Status PENDING  Incomplete    Lab Basic Metabolic Panel: Recent Labs  Lab 05/18/19 0416 05/19/19 0220 05/19/19 0500 05/19/19 0500 05/20/19 0350 05/20/19 0350 05/22/19 0425 05/22/19 1801 05/23/19 0515 05/23/19 0515 05/23/19 0627 05/23/19 0900 05/23/19 1005 05/12/2019 0820 05/08/2019 0944  NA 139   < > 140   < > 143   < > 142   < > 145   < > 141 145 142 141 137  K 5.3*   < > 4.5   < > 3.7   < > 3.6   < > 3.2*   < > 4.9 5.0 4.9 >7.5* 7.7*  CL 97*  --  98   <  > 96*  --  94*  --  106  --   --  111  --  107  --   CO2 33*  --  33*   < > 37*  --  38*  --  27  --   --  16*  --  13*  --   GLUCOSE 229*  --  235*   < > 87  --  172*  --  86  --   --  57*  --  176*  --   BUN 47*  --  40*   < > 39*  --  42*  --  42*  --   --  47*  --  73*  --   CREATININE 0.57  --  0.62   < > 0.59  --  0.66  --  0.94  --   --  1.27*  --  2.72*  --   CALCIUM 8.4*  --  7.9*   < > 8.2*  --  7.6*  --  6.0*  --   --  5.2*  --  6.8*  --   MG 2.3  --  2.0  --   --   --  2.1  --   --   --   --   --   --   --   --   PHOS 2.6  --  3.1  --   --   --   --   --   --   --   --   --   --   --   --    < > = values in this interval not displayed.   Liver Function Tests: Recent Labs  Lab 05/22/19 0425 05/23/19 0900 06-19-19 0820  AST 39 2,436* >10,000*  ALT 43 2,335* 10,627*  ALKPHOS 63 70 308*  BILITOT 0.7 1.2 1.5*  PROT 5.2* 4.4* 4.2*  ALBUMIN 1.9* 1.4* 1.3*   No results for input(s): LIPASE, AMYLASE in the last 168 hours. No results for input(s): AMMONIA in the last 168 hours. CBC: Recent Labs  Lab 05/20/19 0350 05/20/19 0350 05/22/19 0425 05/22/19 1801 05/23/19 0515 05/23/19 0515 05/23/19 0627 05/23/19 1005 06/19/19 0034 Jun 19, 2019 0820 2019/06/19 0944  WBC 16.6*  --  12.0*  --  10.9*  --   --   --  9.1 13.1*  --   NEUTROABS 13.9*  --   --   --   --   --   --   --   --  9.3*  --   HGB 9.3*   < > 7.9*   < > 8.8*   < > 9.2* 7.1* 7.9* 8.0* 6.1*  HCT 30.4*   < > 26.2*   < > 30.2*   < > 27.0* 21.0* 29.4* 30.7* 18.0*  MCV 84.2  --  86.2  --  89.1  --   --   --  98.3 100.7*  --   PLT 190  --  171  --  193  --   --   --  146* 129*  --    < > = values in this interval not displayed.   Cardiac Enzymes: No results for input(s): CKTOTAL, CKMB, CKMBINDEX, TROPONINI in the last 168 hours. Sepsis Labs: Recent Labs  Lab 05/22/19 0425 05/23/19 0515 June 19, 2019 0034 2019-06-19 0820  WBC 12.0* 10.9* 9.1 13.1*    Procedures/Operations  Mechanical ventilation.   Kikuye Korenek 19-Jun-2019, 1:33 PM

## 2019-06-06 NOTE — Progress Notes (Signed)
PT Cancellation Note  Patient Details Name: Deborah Miller MRN: [id number] DOB: Oct 28, 1955   Cancelled Treatment:    Reason Eval/Treat Not Completed: Medical issues which prohibited therapy.  Pt vented (although vent settings are trending down), and on significant pressors with soft BPs.  PT to follow along for further medical stability.    Thanks,  Corinna Capra, PT, DPT  Acute Rehabilitation [phone] pager [phone] office  @ [facility]: [phone]     Dimas Aguas 05/14/2019, 8:55 AM

## 2019-06-06 NOTE — Progress Notes (Signed)
NAME:  Deborah Miller, MRN:  [id number], DOB:  1955-06-17, LOS: 66 ADMISSION DATE:  06-12-2019, CONSULTATION DATE:  1/8 REFERRING MD: Mortimer Fries, CHIEF COMPLAINT:  dyspnea   Brief History   64 y/o female with COVID 19 pneumonia admitted on 1/1 to [facility] ICU, treated with BIPAP, high flow nasal cannula, remdesivir and ivermectin.  Developed sub cutaneous emphysema on 1/3.  developed progressive hypoxemia and dyspnea ultimately requiring intubation on 1/7, transferred to [facility] on 1/8. COVID infection initially diagnosed 12/23  Past Medical History  OSA HTN Morbid obesity GERD DM2 Cor pulmonale Asthma Thyroid nodule  Significant Hospital Events   1/1 admit [facility], bipap 1/3 sub q emphysema 1/7 intubated 1/12 tolerates pressure support weaning 1/13 weaned 13 hours, sleepy  Consults:  PCCM  Procedures:  ETT 1/7 >  R IJ CVL  1/7 > 1/15 Left PICC 1/15 >>   Significant Diagnostic Tests:  1/2 LE Doppler > DVT left peroneal vein 1/17 persistent bilateral interstitial alveolar infiltrates (personally reviewed) 1/19 CT abdomen shows absence of flow in all the major mesenteric vessels with evidence of early liver and renal infarction. Micro Data:  12/23 sars cov 2 > pos 1/8resp >> nml flora 1/13 urine >> ng  Antimicrobials:  12/31 Remdesivir > 1/4 1/4 doxycycline> 1/8 1/5 Ivermectin > 1/6  Interim history/subjective:  Continued shock overnight with no substantial reduction in vasopressor support. Further volume challenge did not improve pressor requirements.  Objective   Blood pressure (!) 96/33, pulse 93, temperature (!) 97.1 F (36.2 C), temperature source Oral, resp. rate (!) 35, height 4\' 7"  (1.397 m), weight 106.4 kg, SpO2 (!) 67 %. CVP:  [3 mmHg-7 mmHg] 7 mmHg  Vent Mode: PCV FiO2 (%):  [50 %-70 %] 60 % Set Rate:  [25 bmp-35 bmp] 35 bmp PEEP:  [10 cmH20] 10 cmH20 Plateau Pressure:  [26 cmH20-33 cmH20] 33 cmH20   Intake/Output Summary (Last 24 hours) at  06/05/2019 1332 Last data filed at 05/14/2019 1200 Gross per 24 hour  Intake 5264.64 ml  Output 70 ml  Net 5194.64 ml   Filed Weights   05/23/19 0218 05/28/2019 0500 05/09/2019 1310  Weight: 99.6 kg 106.4 kg 106.4 kg    Examination:  General: Acute and chronically ill-appearing obese woman, short stature, intubated HENT: ET tube in place, mild pallor, no icterus, large neck PULM: Decreased breath sounds bilateral,, no crackles or wheezes CV: Regular, borderline tachycardic, no murmur, trace edem a.  Extremities cool.  Not mottled. GI: Obese, nondistended.  Moderate red current jelly stool. MSK: No deformities Neuro: Now sedated.    Resolved Hospital Problem list     Assessment & Plan:    Critically ill due to concurrent distributive shock requiring titration of vasopressors Severe metabolic acidosis indicating inadequate tissue perfusion in spite of high-dose vasopressors. Catastrophic CT showing loss of blood flow with impending infarction of splanchnic organs. Bicarbonate bolus Bicarbonate infusion Blood transfusion ordered to improve oxygen carrying capacity and to correct anemia  Have spoken to the patient's daughter and informed her of the results of the CT scan.  She remains in refractory shock with ongoing tissue ischemia which will only worsen the vicious cycle of shock.  I informed the daughter that her mother's condition is not correctable and that a transition to comfort care is most appropriate given her recent illness.  She has agreed.  We will arrange for a video visit prior to transition to comfort care and compassionate extubation.   Critically ill due to  ARDS due to COVID 19 pneumonia and exacerbation of known asthma -Failed trial of extubation with subsequent shock state. Continue full ventilator support until compassionate extubation.   Best practice:   Transition to comfort care.  Order set in place. CRITICAL CARE Performed by: Lynnell Catalan   Total  critical care time: 45 minutes  Critical care time was exclusive of separately billable procedures and treating other patients.  Critical care was necessary to treat or prevent imminent or life-threatening deterioration.  Critical care was time spent personally by me on the following activities: development of treatment plan with patient and/or surrogate as well as nursing, discussions with consultants, evaluation of patient's response to treatment, examination of patient, obtaining history from patient or surrogate, ordering and performing treatments and interventions, ordering and review of laboratory studies, ordering and review of radiographic studies, pulse oximetry, re-evaluation of patient's condition and participation in multidisciplinary rounds.  Lynnell Catalan, MD [facility] ICU Physician [facility]  Pager: [phone] Mobile: [phone] After hours: [phone].   05/23/2019

## 2019-06-06 NOTE — Progress Notes (Signed)
Provider said that he has spoken with family and pt is going to comfort care.

## 2019-06-06 NOTE — Progress Notes (Signed)
Spoke with daughter Achilles Dunk [phone]) in reference to items in the safe. Daughter stated she will come pick up today or tomorrow (06-01-2019).

## 2019-06-06 NOTE — Progress Notes (Signed)
NAME: Deborah Miller, MRN: [id number], DOB: 29-May-1955, LOS: 10  ADMISSION DATE: 05/06/2019, CONSULTATION DATE: 1/8  REFERRING MD: Belia Heman, CHIEF COMPLAINT: dyspnea   Brief History   64 y/o female with COVID 19 pneumonia admitted on 1/1 to [facility] ICU, treated with BIPAP, high flow nasal cannula, remdesivir and ivermectin. Developed sub cutaneous emphysema on 1/3. developed progressive hypoxemia and dyspnea ultimately requiring intubation on 1/7, transferred to [facility] on 1/8.  COVID infection initially diagnosed 12/23   Past Medical History  OSA  HTN  Morbid obesity  GERD  DM2  Cor pulmonale  Asthma  Thyroid nodule   Significant Hospital Events   1/1 admit [facility], bipap  1/3 sub q emphysema  1/7 intubated  1/12 tolerates pressure support weaning  1/13 weaned 13 hours, sleepy   Consults:  PCCM   Procedures:  ETT 1/7 >  R IJ CVL 1/7 > 1/15  Left PICC 1/15 >>   Significant Diagnostic Tests:  1/2 LE Doppler > DVT left peroneal vein  1/17 persistent bilateral interstitial alveolar infiltrates (personally reviewed)   Micro Data:  12/23 sars cov 2 > pos  1/8resp >> nml flora  1/13 urine >> ng   Antimicrobials:  12/31 Remdesivir > 1/4  1/4 doxycycline> 1/8  1/5 Ivermectin > 1/6   Interim history/subjective:  Passed SBT yesterday and attempted extubation. Unfortunately, urgently reintubated for increasing respiratory distress. Marked hypotension overnight requiring escalating doses of vasopressors.  1 episode of maroon-colored stool this morning.   Objective   Blood pressure (!) 44/28, pulse (!) 34, temperature (!) 103.1 F (39.5 C), temperature source Axillary, resp. rate (!) 32, height 4\' 7"  (1.397 m), weight 99.6 kg, SpO2 100 %.  CVP: [10 mmHg-11 mmHg] 10 mmHg   >     Intake/Output Summary (Last 24 hours) at 05/23/2019 1248  Last data filed at 05/23/2019 0400        Gross per 24 hour   Intake 2005.1 ml   Output 350 ml   Net 1655.1 ml            Filed Weights    05/21/19 0401 05/22/19 0500 05/23/19 0218   Weight: 95.9 kg 95.5 kg 99.6 kg    Examination:  General: Acute and chronically ill-appearing obese woman, short stature, intubated  HENT: ET tube in place, mild pallor, no icterus, large neck  PULM: Decreased breath sounds bilateral,, no crackles or wheezes  CV: Regular, borderline tachycardic, no murmur, trace edem a. Extremities cool. Not mottled.  GI: Obese, nondistended. Moderate red current jelly stool.  MSK: No deformities  Neuro: Wakes easily, nods to questions, follows commands. Weak extremities.  I performed a bedside echocardiogram with limited acoustic windows which demonstrated normal LV and RV systolic function and size. No significant valvular abnormalities. IVC of small caliber with marked respiratory collapse consistent with hypovolemia.     Resolved Hospital Problem list     Assessment & Plan:    Critically ill due to hypovolemic shock requiring fluid administration and repeated assessment of volume status  Aggressive fluid resuscitation with crystalloid and colloid.  Transfuse PRBC if appropriate.  Suspect overdiuresis prior to extubation which was exacerbated by respiratory distress causing hemodynamic instability.  Critically ill due to concurrent distributive shock requiring titration of vasopressors  Current clinical perfusion acceptable: Patient following commands and has good urine output.  Add vasopressin to norepinephrine.  Monitor clinical perfusion  Will readdress goals of care if remains in refractory shock beyond an initial 24-hour period of  resuscitation. Currently she is showing signs of improving perfusion.  Single episode lower GI bleed concerning for colonic ischemia  Follow CBC  Hold anticoagulation for now  Supportive measures: Restore intravascular volume and minimize vasopressor use.  Not a candidate for surgical intervention.  Critically ill due to ARDS due to COVID 19 pneumonia and exacerbation of known  asthma -Failed trial of extubation yesterday  Resume full ventilator support with lung protective strategy  Resume weaning once hemodynamics improved.  She has been extubated, hopefully she can resume weaning. She may need a tracheostomy for safe extubation  LLE DVT  Enoxaparin therapeutic dose  -Switch to apixaban eventually  DM2  Sliding-scale insulin  Levemir 20 units twice daily  DC tube feed coverage once extubated  Persistent unexplained fever-decreasing leukocytosis, no evidence of new infection, central line has been removed  Monitor closely, panculture if recurs     Best practice:    Diet: High nutritional risk -hold tube feeds today while in shock.  Pain/Anxiety/Delirium protocol (if indicated): Low-dose midazolam infusion  VAP protocol (if indicated): yes  Ventilatory goals: Continue full ventilatory support on current settings  DVT prophylaxis: On hold due to GI bleed. Resume full dose Lovenox if no further bleeding.  Blood pressure goals: Titrate vasopressors to MAP greater than 65  Fluid goals: Appears hypovolemic. Continue fluid resuscitation  GI prophylaxis: Pantoprazole  Glucose control: SSI, Levemir  Urinary catheter: Foley catheter required due to critical illness and need to monitor urine output.  Central line: Left brachial PICC line.  Mobility: bed rest  Code Status: Partial code no CPR  Family Communication: daughter Alyse Low updated 1/18  Disposition: ICU  CRITICAL CARE Performed by: Kipp Brood  Total critical care time: 60 minutes  Critical care time was exclusive of separately billable procedures and treating other patients.  Critical care was necessary to treat or prevent imminent or life-threatening deterioration.  Critical care was time spent personally by me on the following activities: development of treatment plan with patient and/or surrogate as well as nursing, discussions with consultants, evaluation of patient's response to treatment,  examination of patient, obtaining history from patient or surrogate, ordering and performing treatments and interventions, ordering and review of laboratory studies, ordering and review of radiographic studies, pulse oximetry, re-evaluation of patient's condition and participation in multidisciplinary rounds.  Kipp Brood, MD [facility]  ICU Physician  [place]  Pager: [phone]  Mobile: [phone]  After hours: [phone].  05/23/2019     Alison Stalling by Kipp Brood, MD, 05/23/2019 1:09 PM   Kipp Brood, MD  Physician  Critical Care    Progress Notes   Signed  Date of Service: 05/23/2019 12:48 PM       Expand AllCollapse All                                         NAME: Deborah Miller, MRN: [id number], DOB: [birth year], LOS: 34  ADMISSION DATE: 05/27/2019, CONSULTATION DATE: 1/8  REFERRING MD: Mortimer Fries, CHIEF COMPLAINT: dyspnea   Brief History   64 y/o female with COVID 19 pneumonia admitted on 1/1 to [facility] ICU, treated with BIPAP, high flow nasal cannula, remdesivir and ivermectin. Developed sub cutaneous emphysema on 1/3. developed progressive hypoxemia and dyspnea ultimately requiring intubation on 1/7, transferred to [facility] on 1/8.  COVID infection initially diagnosed 12/23   Past Medical History  OSA  HTN  Morbid obesity  GERD  DM2  Cor pulmonale  Asthma  Thyroid nodule   Significant Hospital Events   1/1 admit [facility], bipap  1/3 sub q emphysema  1/7 intubated  1/12 tolerates pressure support weaning  1/13 weaned 13 hours, sleepy   Consults:  PCCM   Procedures:  ETT 1/7 >  R IJ CVL 1/7 > 1/15  Left PICC 1/15 >>   Significant Diagnostic Tests:  1/2 LE Doppler > DVT left peroneal vein  1/17 persistent bilateral interstitial alveolar infiltrates (personally reviewed)   Micro Data:  12/23 sars cov 2 > pos  1/8resp >> nml flora  1/13 urine >> ng   Antimicrobials:  12/31 Remdesivir > 1/4  1/4  doxycycline> 1/8  1/5 Ivermectin > 1/6   Interim history/subjective:  Passed SBT yesterday and attempted extubation. Unfortunately, urgently reintubated for increasing respiratory distress. Marked hypotension overnight requiring escalating doses of vasopressors.  1 episode of maroon-colored stool this morning.   Objective   Blood pressure (!) 44/28, pulse (!) 34, temperature (!) 103.1 F (39.5 C), temperature source Axillary, resp. rate (!) 32, height 4\' 7"  (1.397 m), weight 99.6 kg, SpO2 100 %.  CVP: [10 mmHg-11 mmHg] 10 mmHg   >     Intake/Output Summary (Last 24 hours) at 05/23/2019 1248  Last data filed at 05/23/2019 0400        Gross per 24 hour   Intake 2005.1 ml   Output 350 ml   Net 1655.1 ml            Filed Weights    05/21/19 0401 05/22/19 0500 05/23/19 0218   Weight: 95.9 kg 95.5 kg 99.6 kg    Examination:  General: Acute and chronically ill-appearing obese woman, short stature, intubated  HENT: ET tube in place, mild pallor, no icterus, large neck  PULM: Decreased breath sounds bilateral,, no crackles or wheezes  CV: Regular, borderline tachycardic, no murmur, trace edem a. Extremities cool. Not mottled.  GI: Obese, nondistended. Moderate red current jelly stool.  MSK: No deformities  Neuro: Wakes easily, nods to questions, follows commands. Weak extremities.  I performed a bedside echocardiogram with limited acoustic windows which demonstrated normal LV and RV systolic function and size. No significant valvular abnormalities. IVC of small caliber with marked respiratory collapse consistent with hypovolemia.     Resolved Hospital Problem list     Assessment & Plan:    Critically ill due to hypovolemic shock requiring fluid administration and repeated assessment of volume status  Aggressive fluid resuscitation with crystalloid and colloid.  Transfuse PRBC if appropriate.  Suspect overdiuresis prior to extubation which was exacerbated by respiratory distress causing  hemodynamic instability.  Critically ill due to concurrent distributive shock requiring titration of vasopressors  Current clinical perfusion acceptable: Patient following commands and has good urine output.  Add vasopressin to norepinephrine.  Monitor clinical perfusion  Will readdress goals of care if remains in refractory shock beyond an initial 24-hour period of resuscitation. Currently she is showing signs of improving perfusion.  Single episode lower GI bleed concerning for colonic ischemia  Follow CBC  Hold anticoagulation for now  Supportive measures: Restore intravascular volume and minimize vasopressor use.  Not a candidate for surgical intervention.  Critically ill due to ARDS due to COVID 19 pneumonia and exacerbation of known asthma -Failed trial of extubation yesterday  Resume full ventilator support with lung protective strategy  Resume weaning once hemodynamics improved.  She has been extubated, hopefully she can resume weaning.  She may need a tracheostomy for safe extubation  LLE DVT  Enoxaparin therapeutic dose  -Switch to apixaban eventually  DM2  Sliding-scale insulin  Levemir 20 units twice daily  DC tube feed coverage once extubated  Persistent unexplained fever-decreasing leukocytosis, no evidence of new infection, central line has been removed  Monitor closely, panculture if recurs     Best practice:    Diet: High nutritional risk -hold tube feeds today while in shock.  Pain/Anxiety/Delirium protocol (if indicated): Low-dose midazolam infusion  VAP protocol (if indicated): yes  Ventilatory goals: Continue full ventilatory support on current settings  DVT prophylaxis: On hold due to GI bleed. Resume full dose Lovenox if no further bleeding.  Blood pressure goals: Titrate vasopressors to MAP greater than 65  Fluid goals: Appears hypovolemic. Continue fluid resuscitation  GI prophylaxis: Pantoprazole  Glucose control: SSI, Levemir  Urinary catheter: Foley  catheter required due to critical illness and need to monitor urine output.  Central line: Left brachial PICC line.  Mobility: bed rest  Code Status: Partial code no CPR  Family Communication: daughter Neysa Bonito updated 1/16  Disposition: ICU  CRITICAL CARE Performed by: Lynnell Catalan  Total critical care time: 60 minutes  Critical care time was exclusive of separately billable procedures and treating other patients.  Critical care was necessary to treat or prevent imminent or life-threatening deterioration.  Critical care was time spent personally by me on the following activities: development of treatment plan with patient and/or surrogate as well as nursing, discussions with consultants, evaluation of patient's response to treatment, examination of patient, obtaining history from patient or surrogate, ordering and performing treatments and interventions, ordering and review of laboratory studies, ordering and review of radiographic studies, pulse oximetry, re-evaluation of patient's condition and participation in multidisciplinary rounds.  Lynnell Catalan, MD [facility]  ICU Physician  [facility]  Pager: [phone]  Mobile: [phone]  After hours: [phone].  05/23/2019

## 2019-06-06 NOTE — Progress Notes (Signed)
Assisted tele visit to patient with several family members.  Britani Beattie M Zaakirah Kistner, RN   

## 2019-06-06 NOTE — Progress Notes (Signed)
Orders received for compassionate extubation.  Patient extubated and placed on room air.  Rn and RT at bedside.

## 2019-06-06 DEATH — deceased

## 2019-11-16 ENCOUNTER — Encounter: Payer: BC Managed Care – PPO | Admitting: Obstetrics and Gynecology

## 2021-02-10 IMAGING — DX DG CHEST 1V PORT
1 series · 1 of 1 positions shown · non-contrast
Comparison: Chest radiograph 03/03/2014

CLINICAL DATA: Sent from dr Nya. + covid, bilateral PNA. Put on
oxygen for by PCP [REDACTED], sats in low 80s at office on 3 L [HOSPITAL].
Received 80 mg kenalog there

EXAM:
PORTABLE CHEST 1 VIEW

[chest ap]
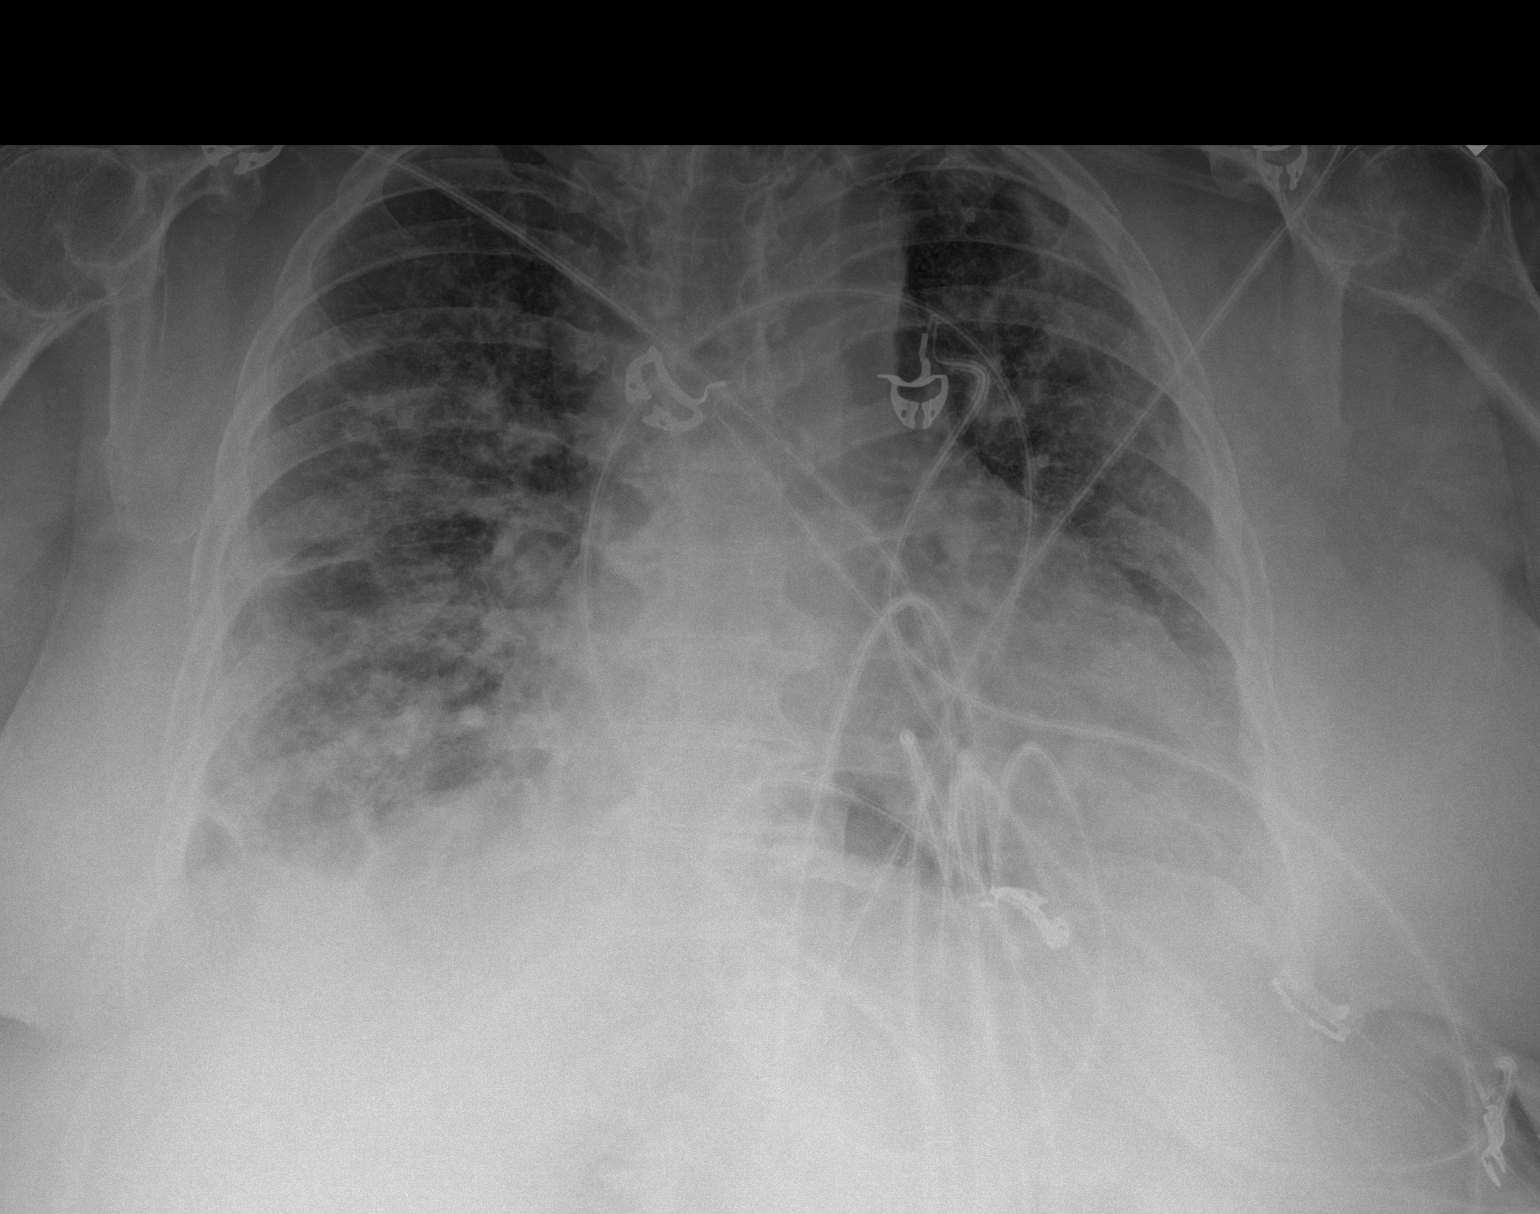

[1 of 1 positions shown; findings below may reference images not displayed]

FINDINGS: Stable cardiomediastinal contours with enlarged heart size. There
are new diffuse bilateral patchy airspace opacities. No pneumothorax
or large pleural effusion. No acute finding in the visualized
skeleton.
IMPRESSION: 1. New diffuse bilateral patchy airspace opacities most likely
representing multifocal pneumonia.
2. Cardiomegaly.

## 2021-02-17 IMAGING — DX DG CHEST 1V PORT
1 series · 1 of 1 positions shown · non-contrast
Comparison: 6646 hours today.

CLINICAL DATA: 63-year-old female intubated. Positive [id number].

EXAM:
PORTABLE CHEST 1 VIEW

[chest ap]
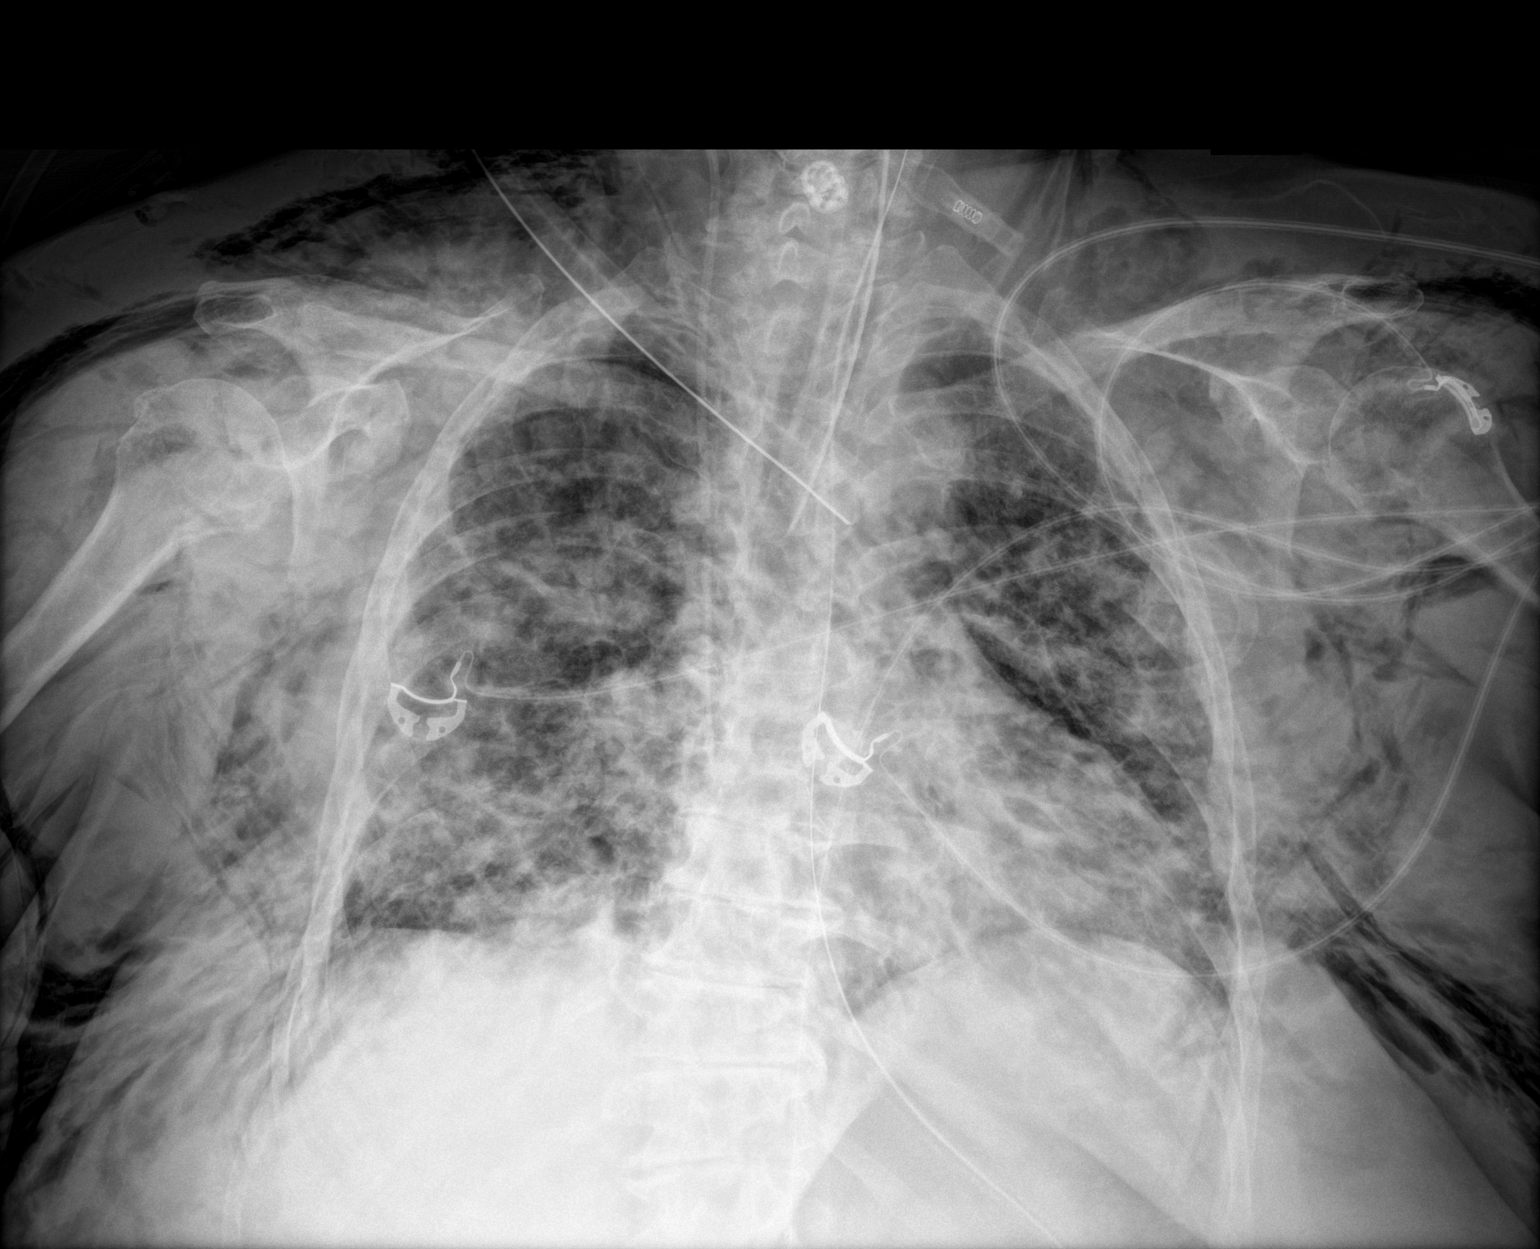

[1 of 1 positions shown; findings below may reference images not displayed]

FINDINGS: In good position between the level portable AP semi upright view at
3773 hours. Endotracheal tube tip the clavicles and carina. Enteric
tube courses to the abdomen, tip not included.

Right IJ central line also placed, tip at the level of the
cavoatrial junction.

Severe bilateral chest wall and lower neck subcutaneous emphysema
again noted. It is unclear whether there might be a persistent small
right pneumothorax. No mediastinal shift. Stable cardiac size and
mediastinal contours. Coarse bilateral pulmonary opacity, with
improved left upper lung and right lower lung ventilation from
earlier today.

Partially visible gas distended stomach.
IMPRESSION: 1. Right IJ central line placed, tip at the level of the cavoatrial
junction.
2. ETT in good position. Enteric tube courses into the stomach, tip
not included.
3. Gastric distention, consider NG tube to suction.
4. Mildly improved bilateral ventilation from earlier today with
continued severe bilateral chest wall and lower neck subcutaneous
emphysema again noted. No obvious pneumothorax identified now.

## 2021-02-19 IMAGING — DX DG CHEST 1V PORT
1 series · 1 of 1 positions shown · non-contrast
Comparison: 05/13/2019 at [DATE] a.m.

CLINICAL DATA: Acute respiratory failure.

EXAM:
PORTABLE CHEST 1 VIEW

[chest ap]
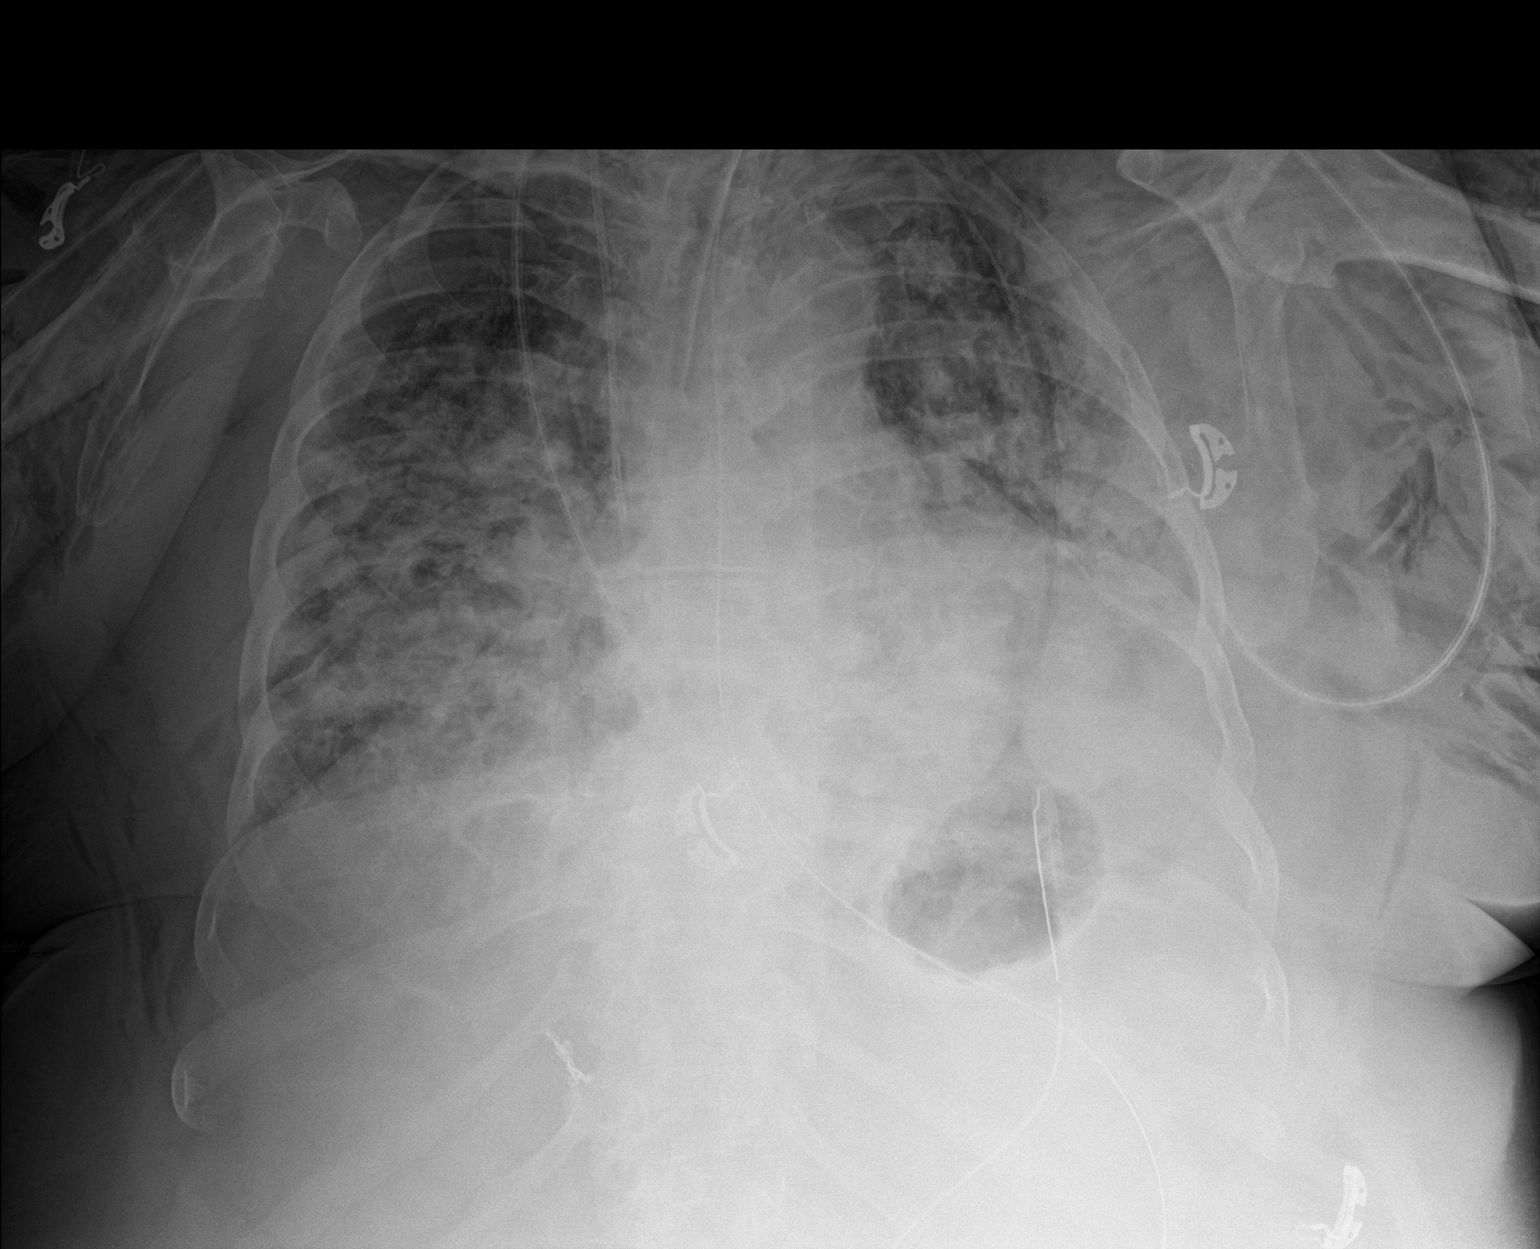

[1 of 1 positions shown; findings below may reference images not displayed]

FINDINGS: The patient is rotated to the left. The endotracheal tube terminates
1 cm above the carina. An enteric tube is looped in the stomach with
tip in the region of the gastric fundus. A right jugular catheter
terminates over the lower SVC. The cardiomediastinal silhouette is
grossly unchanged allowing for patient rotation. Widespread patchy
and confluent airspace opacities throughout both lungs have
increased. No sizable pleural effusion is evident. Widespread soft
tissue emphysema has slightly decreased. No pneumothorax is evident.
IMPRESSION: 1. Endotracheal tube terminates 1 cm above the carina. Consider
retracting 1-2 cm.
2. Worsening bilateral lung opacities compatible with pneumonia.
3. Slightly decreased soft tissue emphysema.

## 2021-02-20 IMAGING — DX DG CHEST 1V PORT
1 series · 1 of 1 positions shown · non-contrast
Comparison: May 14, 2019

CLINICAL DATA: Acute respiratory failure

EXAM:
PORTABLE CHEST 1 VIEW

[chest ap]
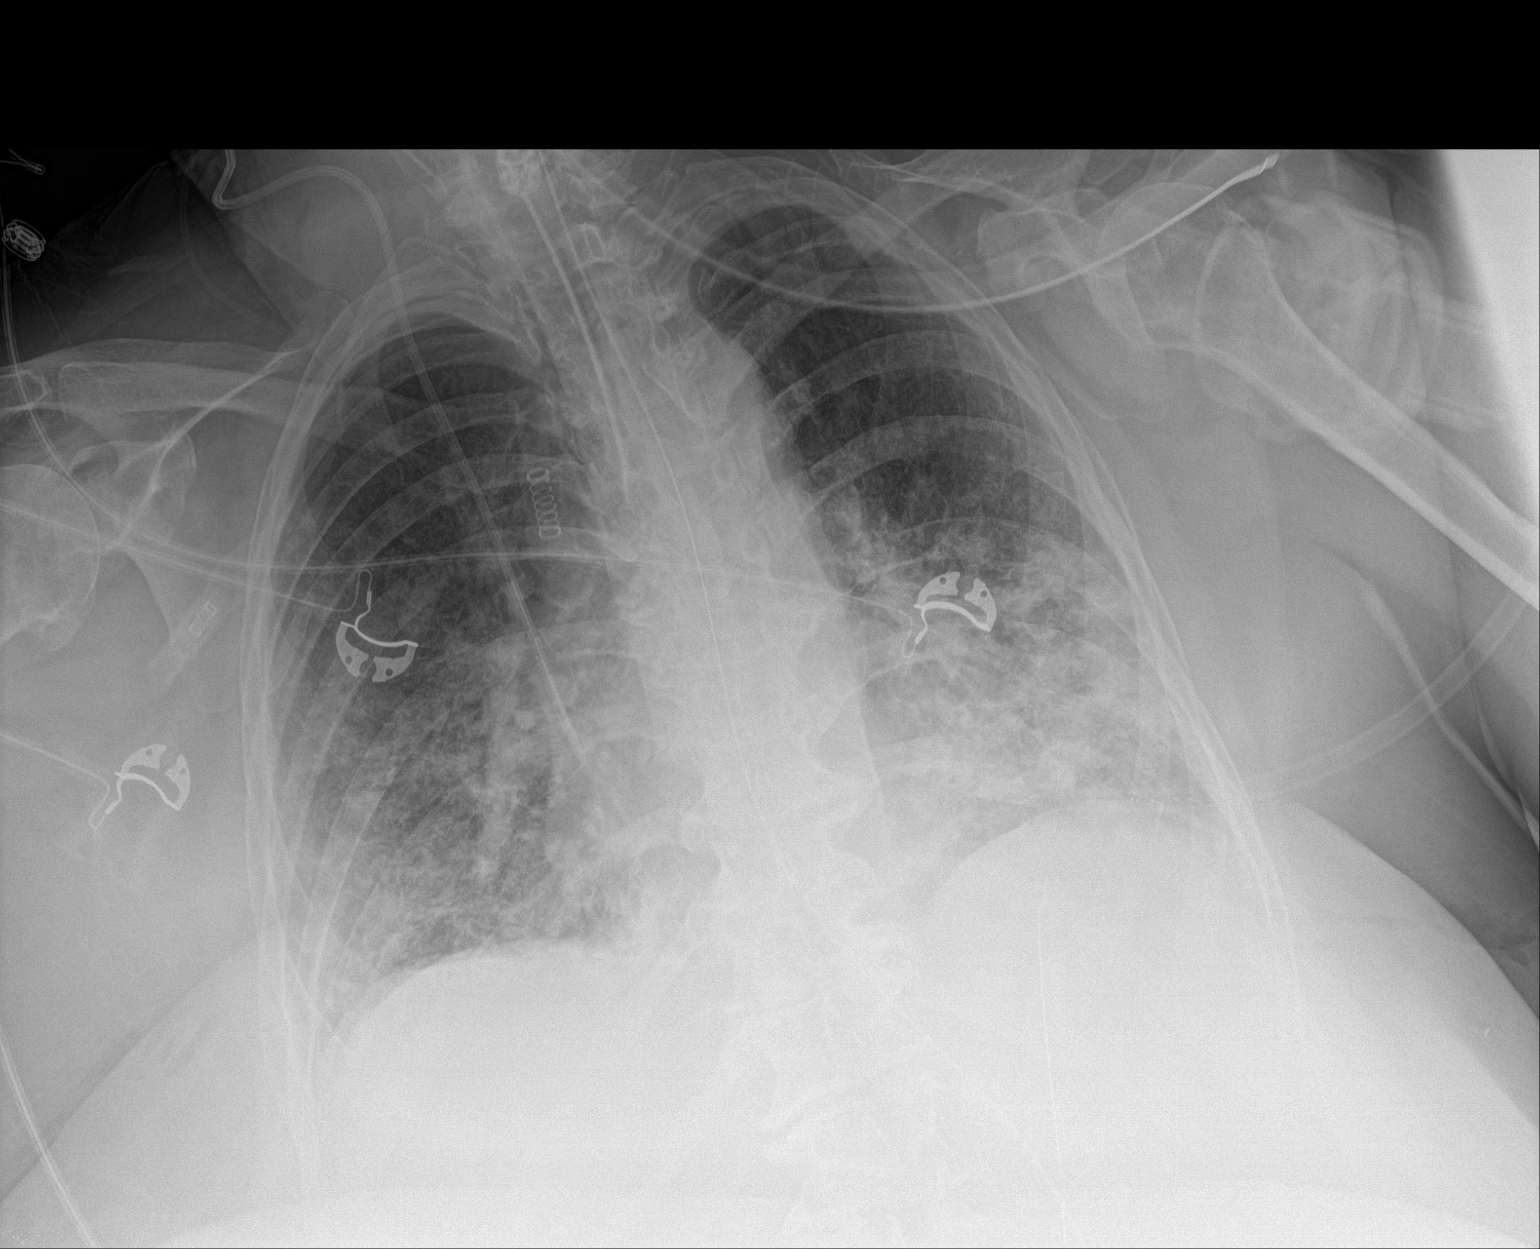

[1 of 1 positions shown; findings below may reference images not displayed]

FINDINGS: The ETT terminates 1.2 cm above the carina. The NG tube terminates
in the stomach. A right central line terminates near the caval
atrial junction. Bilateral pulmonary infiltrates remain but are
improved. The air in the subcutaneous tissues of the chest wall is
nearly resolved. No pneumothorax. No change in the cardiomediastinal
silhouette.
IMPRESSION: 1. The ETT terminates 1.2 cm above the carina. Recommend withdrawing
1 or 2 cm. Other support apparatus as above.
2. Persistent but improving infiltrates.
3. Near complete resolution of subcutaneous air in the soft tissues
of the chest wall.
